# Patient Record
Sex: Male | Born: 1980
Health system: Southern US, Community
[De-identification: ages and names within clinical notes are randomized; demographics above are authoritative.]

## PROBLEM LIST (undated history)

## (undated) DIAGNOSIS — M722 Plantar fascial fibromatosis: Secondary | ICD-10-CM

## (undated) DIAGNOSIS — E785 Hyperlipidemia, unspecified: Secondary | ICD-10-CM

## (undated) DIAGNOSIS — E119 Type 2 diabetes mellitus without complications: Secondary | ICD-10-CM

## (undated) DIAGNOSIS — R0600 Dyspnea, unspecified: Secondary | ICD-10-CM

## (undated) DIAGNOSIS — I10 Essential (primary) hypertension: Secondary | ICD-10-CM

## (undated) DIAGNOSIS — J189 Pneumonia, unspecified organism: Secondary | ICD-10-CM

## (undated) DIAGNOSIS — C801 Malignant (primary) neoplasm, unspecified: Secondary | ICD-10-CM

## (undated) DIAGNOSIS — K219 Gastro-esophageal reflux disease without esophagitis: Secondary | ICD-10-CM

## (undated) HISTORY — PX: OTHER SURGICAL HISTORY: SHX169

---

## 2013-09-22 ENCOUNTER — Ambulatory Visit (INDEPENDENT_AMBULATORY_CARE_PROVIDER_SITE_OTHER): Payer: Managed Care, Other (non HMO) | Admitting: Nurse Practitioner

## 2013-09-22 ENCOUNTER — Encounter: Payer: Self-pay | Admitting: Nurse Practitioner

## 2013-09-22 ENCOUNTER — Encounter (INDEPENDENT_AMBULATORY_CARE_PROVIDER_SITE_OTHER): Payer: Self-pay

## 2013-09-22 VITALS — BP 149/93 | HR 80 | Temp 97.4°F | Ht 77.0 in | Wt 320.0 lb

## 2013-09-22 DIAGNOSIS — K120 Recurrent oral aphthae: Secondary | ICD-10-CM

## 2013-09-22 MED ORDER — MAGIC MOUTHWASH W/LIDOCAINE: 5.0000 mL | Freq: Four times a day (QID) | ORAL | Status: DC | PRN

## 2013-09-22 NOTE — Progress Notes (Signed)
   Subjective:    Patient ID: Thomas [name], male    DOB: 30-Sep-1981, 30 y.o.   MRN: [id number]  HPI Patient in c/o ulcer on his tongue- he felt it start coming on and now sore to touch- has tried some "home remedies" but nothing has helped. Patient denies use of tobacco products.    Review of Systems  Constitutional: Negative.   HENT: Negative.   Respiratory: Negative.   Cardiovascular: Negative.   Gastrointestinal: Negative.   All other systems reviewed and are negative.       Objective:   Physical Exam  Constitutional: He appears well-developed and well-nourished.  HENT:  Right Ear: External ear normal.  Left Ear: External ear normal.  Nose: Nose normal.  Mouth/Throat: Oropharynx is clear and moist.  1 cm white sore left side of tongue  Cardiovascular: Normal rate, regular rhythm and normal heart sounds.   Pulmonary/Chest: Effort normal and breath sounds normal.  Skin: Skin is warm and dry.     BP 149/93  Pulse 80  Temp(Src) 97.4 F (36.3 C) (Oral)  Ht 6\' 5"  (1.956 m)  Wt 320 lb (145.151 kg)  BMI 37.94 kg/m2      Assessment & Plan:   1. Canker sores oral    Meds ordered this encounter  Medications  . Alum & Mag Hydroxide-Simeth (MAGIC MOUTHWASH W/LIDOCAINE) SOLN    Sig: Take 5 mLs by mouth 4 (four) times daily as needed for mouth pain.    Dispense:  150 mL    Refill:  0    Order Specific Question:  Supervising Provider    Answer:  Deborra Medina   Gargling with salt water will help dry it up RTO prn  Mary-Margaret Daphine Deutscher, FNP

## 2013-09-22 NOTE — Patient Instructions (Signed)
Canker Sores  Canker sores are painful, open sores on the inside of the mouth and cheek. They may be white or yellow. The sores usually heal in 1 to 2 weeks. Women are more likely than men to have recurrent canker sores. CAUSES The cause of canker sores is not well understood. More than one cause is likely. Canker sores do not appear to be caused by certain types of germs (viruses or bacteria). Canker sores may be caused by:  An allergic reaction to certain foods.  Digestive problems.  Not having enough vitamin B12, folic acid, and iron.  Male sex hormones. Sores may come only during certain phases of a menstrual cycle. Often, there is improvement during pregnancy.  Genetics. Some people seem to inherit canker sore problems. Emotional stress and injuries to the mouth may trigger outbreaks, but not cause them.  DIAGNOSIS Canker sores are diagnosed by exam.  TREATMENT  Patients who have frequent bouts of canker sores may have cultures taken of the sores, blood tests, or allergy tests. This helps determine if their sores are caused by a poor diet, an allergy, or some other preventable or treatable disease.  Vitamins may prevent recurrences or reduce the severity of canker sores in people with poor nutrition.  Numbing ointments can relieve pain. These are available in drug stores without a prescription.  Anti-inflammatory steroid mouth rinses or gels may be prescribed by your caregiver for severe sores.  Oral steroids may be prescribed if you have severe, recurrent canker sores. These strong medicines can cause many side effects and should be used only under the close direction of a dentist or physician.  Mouth rinses containing the antibiotic medicine may be prescribed. They may lessen symptoms and speed healing. Healing usually happens in about 1 or 2 weeks with or without treatment. Certain antibiotic mouth rinses given to pregnant women and young children can permanently stain teeth.  Talk to your caregiver about your treatment. HOME CARE INSTRUCTIONS   Avoid foods that cause canker sores for you.  Avoid citrus juices, spicy or salty foods, and coffee until the sores are healed.  Use a soft-bristled toothbrush.  Chew your food carefully to avoid biting your cheek.  Apply topical numbing medicine to the sore to help relieve pain.  Apply a thin paste of baking soda and water to the sore to help heal the sore.  Only use mouth rinses or medicines for pain or discomfort as directed by your caregiver. SEEK MEDICAL CARE IF:   Your symptoms are not better in 1 week.  Your sores are still present after 2 weeks.  Your sores are very painful.  You have trouble breathing or swallowing.  Your sores come back frequently. Document Released: 01/12/2011 Document Revised: 01/12/2013 Document Reviewed: 01/12/2011 [employer] Patient Information 2014 [employer].  

## 2014-02-09 ENCOUNTER — Encounter: Payer: Self-pay | Admitting: Family Medicine

## 2014-02-09 ENCOUNTER — Ambulatory Visit (INDEPENDENT_AMBULATORY_CARE_PROVIDER_SITE_OTHER): Payer: Managed Care, Other (non HMO) | Admitting: Family Medicine

## 2014-02-09 VITALS — BP 121/76 | HR 81 | Temp 98.7°F | Ht 77.0 in | Wt 312.4 lb

## 2014-02-09 DIAGNOSIS — K219 Gastro-esophageal reflux disease without esophagitis: Secondary | ICD-10-CM

## 2014-02-09 MED ORDER — OMEPRAZOLE 20 MG PO CPDR: 20.0000 mg | DELAYED_RELEASE_CAPSULE | Freq: Every day | ORAL | Status: DC

## 2014-02-09 NOTE — Progress Notes (Signed)
   Subjective:    Patient ID: Thomas [name], male    DOB: 1980/11/10, 33 y.o.   MRN: [id number]  HPI This 33 y.o. male presents for evaluation of GERD sx's.  He has been having difficulty with Indigestion and GERD.   Review of Systems C/o GERD No chest pain, SOB, HA, dizziness, vision change, N/V, diarrhea, constipation, dysuria, urinary urgency or frequency, myalgias, arthralgias or rash.     Objective:   Physical Exam  Vital signs noted  Well developed well nourished male.  HEENT - Head atraumatic Normocephalic                Eyes - PERRLA, Conjuctiva - clear Sclera- Clear EOMI                Ears - EAC's Wnl TM's Wnl Gross Hearing WNL                Nose - Nares patent                 Throat - oropharanx wnl Respiratory - Lungs CTA bilateral Cardiac - RRR S1 and S2 without murmur GI - Abdomen soft Nontender and bowel sounds active x 4 Extremities - No edema. Neuro - Grossly intact.      Assessment & Plan:  GERD (gastroesophageal reflux disease) - Plan: omeprazole (PRILOSEC) 20 MG capsule Po bid and discussed eating bland diet  Deatra CanterWilliam J Oxford FNP

## 2014-02-26 ENCOUNTER — Encounter: Payer: Self-pay | Admitting: Family Medicine

## 2014-02-26 ENCOUNTER — Ambulatory Visit (INDEPENDENT_AMBULATORY_CARE_PROVIDER_SITE_OTHER): Payer: Managed Care, Other (non HMO) | Admitting: Family Medicine

## 2014-02-26 VITALS — BP 128/81 | HR 74 | Temp 97.3°F | Ht 77.0 in | Wt 309.0 lb

## 2014-02-26 DIAGNOSIS — K219 Gastro-esophageal reflux disease without esophagitis: Secondary | ICD-10-CM

## 2014-02-26 MED ORDER — OMEPRAZOLE 40 MG PO CPDR: DELAYED_RELEASE_CAPSULE | ORAL | Status: DC

## 2014-02-26 NOTE — Progress Notes (Signed)
   Subjective:    Patient ID: Thomas [name], male    DOB: Jun 13, 1981, 33 y.o.   MRN: [id number]  HPI  This 33 y.o. male presents for evaluation of left upper quadrant discomfort.  He states the omeprazole 20mg  po bid is not helping. He has been having discomfort and has eaten and it went away.  He states he bends over and feels the discomfort in his abdomen.  He gets sharp pain that is colicky in nature in is left upper quadrant of abdomen and sometimes is above his naval and radiates to the LUQ of his abdomen.  He has hx of GERD.  He denies any GERD sx's at night time and states he is sleeping well.  Review of Systems C/o abdominal pain   No chest pain, SOB, HA, dizziness, vision change, N/V, diarrhea, constipation, dysuria, urinary urgency or frequency, myalgias, arthralgias or rash.  Objective:   Physical Exam  Vital signs noted  Well developed well nourished male.  HEENT - Head atraumatic Normocephalic                Eyes - PERRLA, Conjuctiva - clear Sclera- Clear EOMI                Ears - EAC's Wnl TM's Wnl Gross Hearing WNL                Nose - Nares patent                 Throat - oropharanx wnl Respiratory - Lungs CTA bilateral Cardiac - RRR S1 and S2 without murmur GI - Abdomen soft Nontender and bowel sounds active x 4 Extremities - No edema. Neuro - Grossly intact.      Assessment & Plan:  GERD (gastroesophageal reflux disease) - Plan: omeprazole (PRILOSEC) 40 MG capsule, Ambulatory referral to Gastroenterology due to no change in sx's after tx'd for 2 weeks bid PPI tx.  Abdominal pain - Refer to GI, increase GERD med prilosec to 40mg  po bid  Deatra Canter FNP

## 2014-03-09 ENCOUNTER — Other Ambulatory Visit: Payer: Self-pay | Admitting: *Deleted

## 2014-03-09 DIAGNOSIS — K219 Gastro-esophageal reflux disease without esophagitis: Secondary | ICD-10-CM

## 2014-03-09 MED ORDER — OMEPRAZOLE 40 MG PO CPDR: DELAYED_RELEASE_CAPSULE | ORAL | Status: DC

## 2014-03-23 ENCOUNTER — Other Ambulatory Visit: Payer: Self-pay | Admitting: *Deleted

## 2014-03-23 DIAGNOSIS — K219 Gastro-esophageal reflux disease without esophagitis: Secondary | ICD-10-CM

## 2014-03-23 MED ORDER — OMEPRAZOLE 40 MG PO CPDR: DELAYED_RELEASE_CAPSULE | ORAL | Status: DC

## 2014-03-25 ENCOUNTER — Encounter: Payer: Self-pay | Admitting: Gastroenterology

## 2014-06-01 ENCOUNTER — Ambulatory Visit: Payer: Self-pay | Admitting: Gastroenterology

## 2014-06-25 ENCOUNTER — Ambulatory Visit: Payer: Self-pay | Admitting: Gastroenterology

## 2015-03-02 DIAGNOSIS — Z87442 Personal history of urinary calculi: Secondary | ICD-10-CM

## 2015-03-02 HISTORY — DX: Personal history of urinary calculi: Z87.442

## 2015-03-23 ENCOUNTER — Ambulatory Visit (INDEPENDENT_AMBULATORY_CARE_PROVIDER_SITE_OTHER): Payer: Managed Care, Other (non HMO) | Admitting: Family Medicine

## 2015-03-23 ENCOUNTER — Encounter: Payer: Self-pay | Admitting: Family Medicine

## 2015-03-23 ENCOUNTER — Encounter (INDEPENDENT_AMBULATORY_CARE_PROVIDER_SITE_OTHER): Payer: Self-pay

## 2015-03-23 VITALS — BP 135/93 | HR 86 | Temp 98.3°F | Ht 77.0 in | Wt 313.0 lb

## 2015-03-23 DIAGNOSIS — R1032 Left lower quadrant pain: Secondary | ICD-10-CM

## 2015-03-23 DIAGNOSIS — Z0000 Encounter for general adult medical examination without abnormal findings: Secondary | ICD-10-CM

## 2015-03-23 LAB — POCT URINALYSIS DIPSTICK
Bilirubin, UA: NEGATIVE
Glucose, UA: NEGATIVE
KETONES UA: NEGATIVE
Nitrite, UA: NEGATIVE
PROTEIN UA: NEGATIVE
SPEC GRAV UA: 1.025
Urobilinogen, UA: NEGATIVE
pH, UA: 5

## 2015-03-23 LAB — POCT UA - MICROSCOPIC ONLY
CASTS, UR, LPF, POC: NEGATIVE
Crystals, Ur, HPF, POC: NEGATIVE
MUCUS UA: NEGATIVE
Yeast, UA: NEGATIVE

## 2015-03-23 MED ORDER — CIPROFLOXACIN HCL 500 MG PO TABS: 500.0000 mg | ORAL_TABLET | Freq: Two times a day (BID) | ORAL | Status: DC

## 2015-03-23 NOTE — Progress Notes (Signed)
   Subjective:    Patient ID: Thomas [name], male    DOB: 04-27-1981, 34 y.o.   MRN: [id number]  HPI  34 year old gentleman here for physical exam. He has had some vague lower abdominal discomfort with some dysuria intermittently. He was seen at urgent care who mentioned kidney stone without doing urinalysis. Has been no change in bowel habits. There is no history of STD. There is no real urinary frequency or hematuria.  Also complains of some pain in his left lower leg posteriorly. His leg seems to get tired after he is set for a long period of time. There is been no injury or trauma.    Review of Systems  Constitutional: Negative.   HENT: Negative.   Respiratory: Negative.   Cardiovascular: Negative.   Gastrointestinal: Positive for abdominal pain.  Genitourinary: Negative.   Neurological: Negative.   Psychiatric/Behavioral: Negative.    There are no active problems to display for this patient.  Outpatient Encounter Prescriptions as of 03/23/2015  Medication Sig  . tamsulosin (FLOMAX) 0.4 MG CAPS capsule TAKE ONE CAPSULE (0.4 MG TOTAL) BY MOUTH DAILY.  . [DISCONTINUED] omeprazole (PRILOSEC) 40 MG capsule Take one po bid   No facility-administered encounter medications on file as of 03/23/2015.       Objective:   Physical Exam  Constitutional: He is oriented to person, place, and time. He appears well-developed and well-nourished.  HENT:  Head: Normocephalic.  Right Ear: External ear normal.  Left Ear: External ear normal.  Nose: Nose normal.  Mouth/Throat: Oropharynx is clear and moist.  Eyes: Conjunctivae and EOM are normal. Pupils are equal, round, and reactive to light.  Neck: Normal range of motion. Neck supple.  Cardiovascular: Normal rate, regular rhythm, normal heart sounds and intact distal pulses.   Pulmonary/Chest: Effort normal and breath sounds normal.  Abdominal: Soft. Bowel sounds are normal.  Genitourinary:  No hernia and no testicular tenderness or lumps    Musculoskeletal: Normal range of motion.  Neurological: He is alert and oriented to person, place, and time.  Skin: Skin is warm and dry.  Psychiatric: He has a normal mood and affect. His behavior is normal. Judgment and thought content normal.          Assessment & Plan:  1. Suprapubic abdominal pain, left Urinalysis is not normal. There are white cells as well as red cells, etiology of which is undetermined. This could represent stone disease or possibly prostatitis. Would like to treat him for one week with Cipro and then recheck - POCT urinalysis dipstick - POCT UA - Microscopic Only  2. Health care maintenance He has never had lipids or sugar checked. - CMP14+EGFR; Future  Wardell Honour MD - Lipid panel; Future

## 2015-03-23 NOTE — Patient Instructions (Signed)

## 2015-03-26 ENCOUNTER — Other Ambulatory Visit: Payer: Managed Care, Other (non HMO)

## 2015-03-26 DIAGNOSIS — Z0000 Encounter for general adult medical examination without abnormal findings: Secondary | ICD-10-CM

## 2015-03-27 LAB — CMP14+EGFR
ALK PHOS: 69 IU/L (ref 39–117)
ALT: 20 IU/L (ref 0–44)
AST: 17 IU/L (ref 0–40)
Albumin/Globulin Ratio: 1.8 (ref 1.1–2.5)
Albumin: 4.4 g/dL (ref 3.5–5.5)
BUN / CREAT RATIO: 19 (ref 8–19)
BUN: 22 mg/dL — ABNORMAL HIGH (ref 6–20)
Bilirubin Total: 0.9 mg/dL (ref 0.0–1.2)
CALCIUM: 9.7 mg/dL (ref 8.7–10.2)
CO2: 24 mmol/L (ref 18–29)
Chloride: 101 mmol/L (ref 97–108)
Creatinine, Ser: 1.13 mg/dL (ref 0.76–1.27)
GFR calc Af Amer: 99 mL/min/{1.73_m2} (ref >59)
GFR calc non Af Amer: 86 mL/min/{1.73_m2} (ref >59)
GLUCOSE: 94 mg/dL (ref 65–99)
Globulin, Total: 2.5 g/dL (ref 1.5–4.5)
Potassium: 4.6 mmol/L (ref 3.5–5.2)
Sodium: 140 mmol/L (ref 134–144)
Total Protein: 6.9 g/dL (ref 6.0–8.5)

## 2015-03-27 LAB — LIPID PANEL
CHOL/HDL RATIO: 5.3 ratio — AB (ref 0.0–5.0)
Cholesterol, Total: 176 mg/dL (ref 100–199)
HDL: 33 mg/dL — ABNORMAL LOW (ref >39)
LDL Calculated: 116 mg/dL — ABNORMAL HIGH (ref 0–99)
TRIGLYCERIDES: 134 mg/dL (ref 0–149)
VLDL Cholesterol Cal: 27 mg/dL (ref 5–40)

## 2015-03-29 ENCOUNTER — Telehealth: Payer: Self-pay | Admitting: Family Medicine

## 2015-03-29 NOTE — Progress Notes (Signed)
lmtcb

## 2015-03-30 ENCOUNTER — Encounter: Payer: Self-pay | Admitting: *Deleted

## 2015-03-30 NOTE — Telephone Encounter (Signed)
Letter with results mailed.

## 2015-04-07 ENCOUNTER — Ambulatory Visit (INDEPENDENT_AMBULATORY_CARE_PROVIDER_SITE_OTHER): Payer: Managed Care, Other (non HMO) | Admitting: Family Medicine

## 2015-04-07 ENCOUNTER — Encounter: Payer: Self-pay | Admitting: Family Medicine

## 2015-04-07 ENCOUNTER — Ambulatory Visit (INDEPENDENT_AMBULATORY_CARE_PROVIDER_SITE_OTHER): Payer: Managed Care, Other (non HMO)

## 2015-04-07 VITALS — BP 127/79 | HR 76 | Temp 97.9°F | Ht 77.0 in | Wt 314.0 lb

## 2015-04-07 DIAGNOSIS — R109 Unspecified abdominal pain: Secondary | ICD-10-CM | POA: Diagnosis not present

## 2015-04-07 LAB — POCT URINALYSIS DIPSTICK
BILIRUBIN UA: NEGATIVE
GLUCOSE UA: NEGATIVE
Ketones, UA: NEGATIVE
Leukocytes, UA: NEGATIVE
NITRITE UA: NEGATIVE
Urobilinogen, UA: NEGATIVE
pH, UA: 6

## 2015-04-07 LAB — POCT UA - MICROSCOPIC ONLY
CASTS, UR, LPF, POC: NEGATIVE
Crystals, Ur, HPF, POC: NEGATIVE
Epithelial cells, urine per micros: NEGATIVE
Mucus, UA: NEGATIVE
RBC, URINE, MICROSCOPIC: NEGATIVE
WBC, Ur, HPF, POC: NEGATIVE
Yeast, UA: NEGATIVE

## 2015-04-07 NOTE — Progress Notes (Signed)
   Subjective:    Patient ID: Thomas PippinsKurt D Yeo, male    DOB: Aug 23, 1981, 34 y.o.   MRN: [id number]  HPI Patient here today for follow up on abdominal discomfort. He has some flank and some left knee pain.  Flank pain has resolved with a course of Cipro. In discussing this with him he has had some infection in the past was treated with the same metabolic.  He is also complaining of knee pain today. Over the weekend he was crouching on his roof doing pressure washing. There was no fall or injury that he remembers but afterwards he started feeling pain in his left knee on the medial aspect. He has a faint recollection of injury as a teenager playing basketball but that was never really attended to.     There are no active problems to display for this patient.  Outpatient Encounter Prescriptions as of 04/07/2015  Medication Sig  . [DISCONTINUED] ciprofloxacin (CIPRO) 500 MG tablet Take 1 tablet (500 mg total) by mouth 2 (two) times daily.  . [DISCONTINUED] tamsulosin (FLOMAX) 0.4 MG CAPS capsule TAKE ONE CAPSULE (0.4 MG TOTAL) BY MOUTH DAILY.   No facility-administered encounter medications on file as of 04/07/2015.     Review of Systems  Constitutional: Negative.   HENT: Negative.   Eyes: Negative.   Respiratory: Negative.   Cardiovascular: Negative.   Gastrointestinal: Negative.  Abdominal pain: better.  Endocrine: Negative.   Genitourinary: Negative.   Musculoskeletal: Positive for arthralgias (left knee pain).  Skin: Negative.   Allergic/Immunologic: Negative.   Neurological: Negative.   Hematological: Negative.   Psychiatric/Behavioral: Negative.        Objective:   Physical Exam  Constitutional: He appears well-developed and well-nourished.  Musculoskeletal:  Left knee: There is no effusion. There is some tenderness medially the knee is stable to varus and valgus stress. Anterior drawer sign is negative. There is no tenderness on movement of the patella. X-ray shows a small  bones given his rather large size but the joint space is well maintained, tibial spines are normal and x-ray is otherwise normal   BP 127/79 mmHg  Pulse 76  Temp(Src) 97.9 F (36.6 C) (Oral)  Ht 6\' 5"  (1.956 m)  Wt 314 lb (142.429 kg)  BMI 37.23 kg/m2        Assessment & Plan:  1. Flank pain Pain has pretty much resolved do a culture to rule out any residual infection - POCT UA - Microscopic Only - POCT urinalysis dipstick - Urine culture - DG Knee 1-2 Views Left; Future  2 Knee pain Suspect strain of the medial collateral ligament Would recommend neoprene sleeve with ibuprofen and ice to be applied as needed  Frederica KusterStephen M Jashiya Bassett MD

## 2015-04-09 LAB — URINE CULTURE: ORGANISM ID, BACTERIA: NO GROWTH

## 2015-05-07 ENCOUNTER — Ambulatory Visit (INDEPENDENT_AMBULATORY_CARE_PROVIDER_SITE_OTHER): Payer: Managed Care, Other (non HMO) | Admitting: Nurse Practitioner

## 2015-05-07 VITALS — BP 130/83 | HR 91 | Temp 97.5°F | Ht 77.0 in | Wt 320.0 lb

## 2015-05-07 DIAGNOSIS — J069 Acute upper respiratory infection, unspecified: Secondary | ICD-10-CM | POA: Diagnosis not present

## 2015-05-07 MED ORDER — AZITHROMYCIN 250 MG PO TABS: ORAL_TABLET | ORAL | Status: DC

## 2015-05-07 NOTE — Patient Instructions (Signed)

## 2015-05-07 NOTE — Progress Notes (Signed)
  Subjective:     Thomas [name] is a 34 y.o. male here for evaluation of a cough. Onset of symptoms was 1 week ago. Symptoms have been gradually worsening since that time. The cough is dry, harsh and nonproductive and is aggravated by nothing. Associated symptoms include: change in voice, fever and achiness. Patient does not have a history of asthma. Patient does have a history of environmental allergens. Patient has not traveled recently. Patient does not have a history of smoking. Patient has not had a previous chest x-ray. Patient has not had a PPD done.  The following portions of the patient's history were reviewed and updated as appropriate: allergies, current medications, past family history, past medical history, past social history, past surgical history and problem list.  Review of Systems Pertinent items are noted in HPI.    Objective:     BP 130/83 mmHg  Pulse 91  Temp(Src) 97.5 F (36.4 C) (Oral)  Ht  (1.956 m)  Wt 320 lb (145.151 kg)  BMI 37.94 kg/m2 General appearance: alert and cooperative Eyes: conjunctivae/corneas clear. PERRL, EOM's intact. Fundi benign. Ears: normal TM's and external ear canals both ears Nose: clear discharge, moderate congestion, no sinus tenderness Throat: lips, mucosa, and tongue normal; teeth and gums normal Neck: no adenopathy, no carotid bruit, no JVD, supple, symmetrical, trachea midline and thyroid not enlarged, symmetric, no tenderness/mass/nodules Lungs: clear to auscultation bilaterally and deep dry cough Heart: regular rate and rhythm, S1, S2 normal, no murmur, click, rub or gallop    Assessment:    URI with Post Nasal Drip    Plan:   1. Take meds as prescribed 2. Use a cool mist humidifier especially during the winter months and when heat has been humid. 3. Use saline nose sprays frequently 4. Saline irrigations of the nose can be very helpful if done frequently.  * 4X daily for 1 week*  * Use of a nettie pot can be helpful  with this. Follow directions with this* 5. Drink plenty of fluids 6. Keep thermostat turn down low 7.For any cough or congestion  Use plain Mucinex- regular strength or max strength is fine   * Children- consult with Pharmacist for dosing 8. For fever or aces or pains- take tylenol or ibuprofen appropriate for age and weight.  * for fevers greater than 101 orally you may alternate ibuprofen and tylenol every  3 hours.       Meds ordered this encounter  Medications  . azithromycin (ZITHROMAX Z-PAK) 250 MG tablet    Sig: As directed    Dispense:  1 each    Refill:  0    Order Specific Question:  Supervising Provider    Answer:  Ernestina Penna [[id number]]   Mary-Margaret Daphine Deutscher, FNP

## 2015-09-06 ENCOUNTER — Telehealth: Payer: Self-pay | Admitting: Family Medicine

## 2015-09-06 NOTE — Telephone Encounter (Signed)
denied °

## 2015-09-27 ENCOUNTER — Ambulatory Visit (INDEPENDENT_AMBULATORY_CARE_PROVIDER_SITE_OTHER): Payer: Managed Care, Other (non HMO) | Admitting: Family Medicine

## 2015-09-27 ENCOUNTER — Encounter: Payer: Self-pay | Admitting: Family Medicine

## 2015-09-27 VITALS — BP 127/87 | HR 94 | Temp 97.1°F | Ht 77.0 in | Wt 340.2 lb

## 2015-09-27 DIAGNOSIS — L739 Follicular disorder, unspecified: Secondary | ICD-10-CM | POA: Diagnosis not present

## 2015-09-27 MED ORDER — SULFAMETHOXAZOLE-TRIMETHOPRIM 800-160 MG PO TABS: ORAL_TABLET | ORAL | Status: DC

## 2015-09-27 NOTE — Progress Notes (Signed)
   Subjective:    Patient ID: Ronald PippinsKurt D Bibbins, male    DOB: 1981-03-05, 34 y.o.   MRN: [id number]  HPI [age] year old with skin issues. He was seen by dermatologist and given doxycycline for what was thought to be a staph folliculitis. For the past month and a half he has had bumps on his skin from the neck down. They do not turn into abscesses or boils but are red and papular and scab over there probably about 20 altogether now. The doxycycline had no effect.  There are no active problems to display for this patient.  Outpatient Encounter Prescriptions as of 09/27/2015  Medication Sig  . [DISCONTINUED] azithromycin (ZITHROMAX Z-PAK) 250 MG tablet As directed   No facility-administered encounter medications on file as of 09/27/2015.     Review of Systems  Skin: Positive for rash.       Objective:   Physical Exam  Skin:  Numerous lesions on the extremities and trunk in various stages of development and poor healing. They're macular papular which scab over. There is no drainage. Nothing really to culture but I would still say these are related to a staph infection. This started after a vacation trip to the [place].          Assessment & Plan:  1. Folliculitis Since doxycycline was not effective will try Septra. He is to take 1 tablet twice a day for 1 week and then once a day thereafter until he sees dermatologist again. - Ambulatory referral to Dermatology  Frederica KusterStephen M Lasharn Bufkin MD

## 2015-11-24 ENCOUNTER — Encounter: Payer: Self-pay | Admitting: Family Medicine

## 2015-11-24 ENCOUNTER — Ambulatory Visit (INDEPENDENT_AMBULATORY_CARE_PROVIDER_SITE_OTHER): Payer: Managed Care, Other (non HMO) | Admitting: Family Medicine

## 2015-11-24 VITALS — BP 140/90 | HR 114 | Temp 101.1°F | Ht 77.0 in | Wt 340.4 lb

## 2015-11-24 DIAGNOSIS — J111 Influenza due to unidentified influenza virus with other respiratory manifestations: Secondary | ICD-10-CM

## 2015-11-24 DIAGNOSIS — R6883 Chills (without fever): Secondary | ICD-10-CM | POA: Diagnosis not present

## 2015-11-24 DIAGNOSIS — R52 Pain, unspecified: Secondary | ICD-10-CM | POA: Diagnosis not present

## 2015-11-24 DIAGNOSIS — R509 Fever, unspecified: Secondary | ICD-10-CM | POA: Diagnosis not present

## 2015-11-24 LAB — POCT INFLUENZA A/B
Influenza A, POC: POSITIVE — AB
Influenza B, POC: NEGATIVE

## 2015-11-24 MED ORDER — OSELTAMIVIR PHOSPHATE 75 MG PO CAPS: 75.0000 mg | ORAL_CAPSULE | Freq: Two times a day (BID) | ORAL | Status: DC

## 2015-11-24 NOTE — Progress Notes (Signed)
BP 140/90 mmHg  Pulse 114  Temp(Src) 101.1 F (38.4 C) (Oral)  Ht  (1.956 m)  Wt 340 lb 6.4 oz (154.404 kg)  BMI 40.36 kg/m2   Subjective:    Patient ID: Thomas [name], male    DOB: 31-Oct-1980, 35 y.o.   MRN: [id number]  HPI: Thomas [name] is a 35 y.o. male presenting on 11/24/2015 for Generalized Body Aches; Chills; Fever; and Cough & chest congestion   HPI Fevers chills and body aches Patient has been having fevers chills and body aches and chest congestion and cough for the past 2 days. He does not know of anybody specifically that he would've gotten this from. He has been using some over-the-counter DayQuil and NyQuil. He is also been using Tylenol and ibuprofen for it. His fever today is 101.1 and has been as high as 102 yesterday. He denies any shortness of breath or wheezing. His cough is productive of yellow sputum  Relevant past medical, surgical, family and social history reviewed and updated as indicated. Interim medical history since our last visit reviewed. Allergies and medications reviewed and updated.  Review of Systems  Constitutional: Positive for fever and chills.  HENT: Positive for congestion, postnasal drip, rhinorrhea, sinus pressure, sneezing and sore throat. Negative for ear discharge, ear pain and voice change.   Eyes: Negative for pain, discharge, redness and visual disturbance.  Respiratory: Positive for cough. Negative for chest tightness, shortness of breath and wheezing.   Cardiovascular: Negative for chest pain and leg swelling.  Gastrointestinal: Negative for abdominal pain, diarrhea and constipation.  Genitourinary: Negative for difficulty urinating.  Musculoskeletal: Negative for back pain and gait problem.  Skin: Negative for rash.  Neurological: Negative for syncope, light-headedness and headaches.  All other systems reviewed and are negative.   Per HPI unless specifically indicated above     Medication List       This list is  accurate as of: 11/24/15  9:37 AM.  Always use your most recent med list.               oseltamivir 75 MG capsule  Commonly known as:  TAMIFLU  Take 1 capsule (75 mg total) by mouth 2 (two) times daily.           Objective:    BP 140/90 mmHg  Pulse 114  Temp(Src) 101.1 F (38.4 C) (Oral)  Ht  (1.956 m)  Wt 340 lb 6.4 oz (154.404 kg)  BMI 40.36 kg/m2  Wt Readings from Last 3 Encounters:  11/24/15 340 lb 6.4 oz (154.404 kg)  09/27/15 340 lb 3.2 oz (154.314 kg)  05/07/15 320 lb (145.151 kg)    Physical Exam  Constitutional: He is oriented to person, place, and time. He appears well-developed and well-nourished. No distress.  HENT:  Right Ear: Tympanic membrane, external ear and ear canal normal.  Left Ear: Tympanic membrane, external ear and ear canal normal.  Nose: Mucosal edema and rhinorrhea present. No sinus tenderness. No epistaxis. Right sinus exhibits no maxillary sinus tenderness and no frontal sinus tenderness. Left sinus exhibits no maxillary sinus tenderness and no frontal sinus tenderness.  Mouth/Throat: Uvula is midline and mucous membranes are normal. Posterior oropharyngeal edema and posterior oropharyngeal erythema present. No oropharyngeal exudate or tonsillar abscesses.  Eyes: Conjunctivae and EOM are normal. Pupils are equal, round, and reactive to light. Right eye exhibits no discharge. No scleral icterus.  Neck: Neck supple. No thyromegaly present.  Cardiovascular: Normal rate, regular rhythm,  normal heart sounds and intact distal pulses.   No murmur heard. Pulmonary/Chest: Effort normal and breath sounds normal. No respiratory distress. He has no wheezes.  Musculoskeletal: Normal range of motion. He exhibits no edema.  Lymphadenopathy:    He has no cervical adenopathy.  Neurological: He is alert and oriented to person, place, and time. Coordination normal.  Skin: Skin is warm and dry. No rash noted. He is not diaphoretic.  Psychiatric: He has a  normal mood and affect. His behavior is normal.  Nursing note and vitals reviewed.   Results for orders placed or performed in visit on 11/24/15  POCT Influenza A/B  Result Value Ref Range   Influenza A, POC Positive (A) Negative   Influenza B, POC Negative Negative      Assessment & Plan:   Problem List Items Addressed This Visit    None    Visit Diagnoses    Body aches    -  Primary    Relevant Orders    POCT Influenza A/B (Completed)    Chills        Relevant Orders    POCT Influenza A/B (Completed)    Fever, unspecified        Relevant Medications    oseltamivir (TAMIFLU) 75 MG capsule    Other Relevant Orders    POCT Influenza A/B (Completed)    Influenza with respiratory manifestation        Relevant Medications    oseltamivir (TAMIFLU) 75 MG capsule        Follow up plan: Return if symptoms worsen or fail to improve.  Counseling provided for all of the vaccine components Orders Placed This Encounter  Procedures  . POCT Influenza A/B    Arville Care, MD [facility] 11/24/2015, 9:37 AM

## 2015-11-28 DIAGNOSIS — Z0289 Encounter for other administrative examinations: Secondary | ICD-10-CM

## 2015-12-28 ENCOUNTER — Ambulatory Visit (INDEPENDENT_AMBULATORY_CARE_PROVIDER_SITE_OTHER): Payer: Managed Care, Other (non HMO) | Admitting: Family Medicine

## 2015-12-28 ENCOUNTER — Encounter: Payer: Self-pay | Admitting: Family Medicine

## 2015-12-28 VITALS — BP 125/82 | HR 101 | Temp 100.1°F | Ht 77.0 in | Wt 344.4 lb

## 2015-12-28 DIAGNOSIS — L410 Pityriasis lichenoides et varioliformis acuta: Secondary | ICD-10-CM

## 2015-12-28 DIAGNOSIS — J020 Streptococcal pharyngitis: Secondary | ICD-10-CM | POA: Diagnosis not present

## 2015-12-28 DIAGNOSIS — J029 Acute pharyngitis, unspecified: Secondary | ICD-10-CM | POA: Diagnosis not present

## 2015-12-28 LAB — RAPID STREP SCREEN (MED CTR MEBANE ONLY): STREP GP A AG, IA W/REFLEX: POSITIVE — AB

## 2015-12-28 MED ORDER — AZITHROMYCIN 250 MG PO TABS: ORAL_TABLET | ORAL | Status: DC

## 2015-12-29 NOTE — Progress Notes (Signed)
BP 125/82 mmHg  Pulse 101  Temp(Src) 100.1 F (37.8 C) (Oral)  Ht 6\' 5"  (1.956 m)  Wt 344 lb 6.4 oz (156.219 kg)  BMI 40.83 kg/m2   Subjective:    Patient ID: Thomas PippinsKurt D Fils, male    DOB: [birth year], 35 y.o.   MRN: [id number]  HPI: Thomas [name] is a 35 y.o. male presenting on 12/28/2015 for Sore Throat   HPI Sore throat Patient is coming in with 2 days of sore throat and fevers and aches and chills. He did just have the flu a couple weeks ago but that resolved and this started just a couple days ago. His fever has been low-grade and his sore throat is been significant but not as bad as he had before when he has had strep previously. He denies any shortness of breath or wheezing.  Rash Patient has had a rash that is been diagnosed previously by a dermatologist as PLEVA and he has tried antibiotic courses for this. He still has the rash which are small papules that turn into scabs and then scar over spread over his body sparing palms and soles and genitalia and mucosal surfaces. He denies any fevers or chills or drainage out of the sites. He tried doxycycline the first-line treatment for it but has not tried anything since then. They did a biopsy of it and diagnoses this at the dermatologist per the patient.  Relevant past medical, surgical, family and social history reviewed and updated as indicated. Interim medical history since our last visit reviewed. Allergies and medications reviewed and updated.  Review of Systems  Constitutional: Positive for fever. Negative for chills.  HENT: Positive for congestion, postnasal drip, rhinorrhea, sinus pressure, sneezing and sore throat. Negative for ear discharge, ear pain and voice change.   Eyes: Negative for pain, discharge, redness and visual disturbance.  Respiratory: Negative for cough, shortness of breath and wheezing.   Cardiovascular: Negative for chest pain and leg swelling.  Gastrointestinal: Negative for abdominal pain, diarrhea  and constipation.  Genitourinary: Negative for difficulty urinating.  Musculoskeletal: Positive for myalgias. Negative for back pain and gait problem.  Skin: Positive for rash.  Neurological: Negative for syncope, light-headedness and headaches.  All other systems reviewed and are negative.   Per HPI unless specifically indicated above     Medication List       This list is accurate as of: 12/28/15 11:59 PM.  Always use your most recent med list.               azithromycin 250 MG tablet  Commonly known as:  ZITHROMAX  Take 2 the first day and then one each day after. Repeat after 2nd week           Objective:    BP 125/82 mmHg  Pulse 101  Temp(Src) 100.1 F (37.8 C) (Oral)  Ht 6\' 5"  (1.956 m)  Wt 344 lb 6.4 oz (156.219 kg)  BMI 40.83 kg/m2  Wt Readings from Last 3 Encounters:  12/28/15 344 lb 6.4 oz (156.219 kg)  11/24/15 340 lb 6.4 oz (154.404 kg)  09/27/15 340 lb 3.2 oz (154.314 kg)    Physical Exam  Constitutional: He is oriented to person, place, and time. He appears well-developed and well-nourished. No distress.  HENT:  Right Ear: Tympanic membrane, external ear and ear canal normal.  Left Ear: Tympanic membrane, external ear and ear canal normal.  Nose: Mucosal edema and rhinorrhea present. No sinus tenderness. No epistaxis. Right sinus  exhibits maxillary sinus tenderness. Right sinus exhibits no frontal sinus tenderness. Left sinus exhibits maxillary sinus tenderness. Left sinus exhibits no frontal sinus tenderness.  Mouth/Throat: Uvula is midline and mucous membranes are normal. Posterior oropharyngeal edema and posterior oropharyngeal erythema present. No oropharyngeal exudate or tonsillar abscesses.  Eyes: Conjunctivae and EOM are normal. Pupils are equal, round, and reactive to light. Right eye exhibits no discharge. No scleral icterus.  Neck: Neck supple. No thyromegaly present.  Cardiovascular: Normal rate, regular rhythm, normal heart sounds and  intact distal pulses.   No murmur heard. Pulmonary/Chest: Effort normal and breath sounds normal. No respiratory distress. He has no wheezes. He has no rales.  Musculoskeletal: Normal range of motion. He exhibits no edema.  Lymphadenopathy:    He has no cervical adenopathy.  Neurological: He is alert and oriented to person, place, and time. Coordination normal.  Skin: Skin is warm and dry. Rash (Few scattered papules on arms and legs and torso sparing face palms and soles genitalia and mucosal surfaces.) noted. He is not diaphoretic.  Psychiatric: He has a normal mood and affect. His behavior is normal.  Nursing note and vitals reviewed.   Results for orders placed or performed in visit on 12/28/15  Rapid strep screen (not at [facility])  Result Value Ref Range   Strep Gp A Ag, IA W/Reflex Positive (A) Negative      Assessment & Plan:   Problem List Items Addressed This Visit      Musculoskeletal and Integument   PLEVA (pityriasis lichenoides et varioliformis acuta)   Relevant Medications   azithromycin (ZITHROMAX) 250 MG tablet    Other Visit Diagnoses    Sore throat    -  Primary    Relevant Medications    azithromycin (ZITHROMAX) 250 MG tablet    Other Relevant Orders    Rapid strep screen (not at [facility]) (Completed)    Strep pharyngitis        Relevant Medications    azithromycin (ZITHROMAX) 250 MG tablet       Follow up plan: Return if symptoms worsen or fail to improve.  Counseling provided for all of the vaccine components Orders Placed This Encounter  Procedures  . Rapid strep screen (not at [facility])    Arville Care, MD [facility] 12/28/2015, 10:38 AM

## 2016-02-24 ENCOUNTER — Emergency Department (HOSPITAL_COMMUNITY): Payer: Managed Care, Other (non HMO)

## 2016-02-24 ENCOUNTER — Emergency Department (HOSPITAL_COMMUNITY)
Admission: EM | Admit: 2016-02-24 | Discharge: 2016-02-24 | Disposition: A | Discharge status: Home / Self Care | Payer: Managed Care, Other (non HMO) | Attending: Emergency Medicine | Admitting: Emergency Medicine

## 2016-02-24 ENCOUNTER — Encounter (HOSPITAL_COMMUNITY): Payer: Self-pay | Admitting: Emergency Medicine

## 2016-02-24 DIAGNOSIS — N1330 Unspecified hydronephrosis: Secondary | ICD-10-CM | POA: Insufficient documentation

## 2016-02-24 DIAGNOSIS — R1032 Left lower quadrant pain: Secondary | ICD-10-CM | POA: Diagnosis present

## 2016-02-24 DIAGNOSIS — Z87891 Personal history of nicotine dependence: Secondary | ICD-10-CM | POA: Insufficient documentation

## 2016-02-24 DIAGNOSIS — N201 Calculus of ureter: Secondary | ICD-10-CM | POA: Insufficient documentation

## 2016-02-24 LAB — COMPREHENSIVE METABOLIC PANEL
ALBUMIN: 4.1 g/dL (ref 3.5–5.0)
ALK PHOS: 67 U/L (ref 38–126)
ALT: 26 U/L (ref 17–63)
AST: 23 U/L (ref 15–41)
Anion gap: 9 (ref 5–15)
BUN: 19 mg/dL (ref 6–20)
CALCIUM: 9.6 mg/dL (ref 8.9–10.3)
CHLORIDE: 106 mmol/L (ref 101–111)
CO2: 24 mmol/L (ref 22–32)
Creatinine, Ser: 1.2 mg/dL (ref 0.61–1.24)
GFR calc non Af Amer: >60 mL/min (ref >60)
Glucose, Bld: 126 mg/dL — ABNORMAL HIGH (ref 65–99)
POTASSIUM: 4.1 mmol/L (ref 3.5–5.1)
SODIUM: 139 mmol/L (ref 135–145)
Total Bilirubin: 0.8 mg/dL (ref 0.3–1.2)
Total Protein: 7.3 g/dL (ref 6.5–8.1)

## 2016-02-24 LAB — URINALYSIS, ROUTINE W REFLEX MICROSCOPIC
BILIRUBIN URINE: NEGATIVE
Glucose, UA: NEGATIVE mg/dL
KETONES UR: NEGATIVE mg/dL
Leukocytes, UA: NEGATIVE
NITRITE: NEGATIVE
Protein, ur: NEGATIVE mg/dL
Specific Gravity, Urine: 1.024 (ref 1.005–1.030)
pH: 5.5 (ref 5.0–8.0)

## 2016-02-24 LAB — URINE MICROSCOPIC-ADD ON: WBC UA: NONE SEEN WBC/hpf (ref 0–5)

## 2016-02-24 LAB — CBC WITH DIFFERENTIAL/PLATELET
BASOS PCT: 0 %
Basophils Absolute: 0 10*3/uL (ref 0.0–0.1)
EOS ABS: 0.2 10*3/uL (ref 0.0–0.7)
EOS PCT: 3 %
HCT: 45.5 % (ref 39.0–52.0)
Hemoglobin: 15 g/dL (ref 13.0–17.0)
LYMPHS ABS: 3.6 10*3/uL (ref 0.7–4.0)
Lymphocytes Relative: 38 %
MCH: 26.4 pg (ref 26.0–34.0)
MCHC: 33 g/dL (ref 30.0–36.0)
MCV: 80 fL (ref 78.0–100.0)
MONO ABS: 0.9 10*3/uL (ref 0.1–1.0)
MONOS PCT: 10 %
NEUTROS PCT: 49 %
Neutro Abs: 4.6 10*3/uL (ref 1.7–7.7)
PLATELETS: 265 10*3/uL (ref 150–400)
RBC: 5.69 MIL/uL (ref 4.22–5.81)
RDW: 14 % (ref 11.5–15.5)
WBC: 9.4 10*3/uL (ref 4.0–10.5)

## 2016-02-24 LAB — LIPASE, BLOOD: Lipase: 27 U/L (ref 11–51)

## 2016-02-24 MED ORDER — OXYCODONE-ACETAMINOPHEN 5-325 MG PO TABS: 1.0000 | ORAL_TABLET | Freq: Four times a day (QID) | ORAL | Status: DC | PRN

## 2016-02-24 MED ORDER — ONDANSETRON HCL 4 MG/2ML IJ SOLN
4.0000 mg | Freq: Once | INTRAMUSCULAR | Status: AC
  Administered 2016-02-24: 4 mg via INTRAVENOUS
  Filled 2016-02-24: qty 2

## 2016-02-24 MED ORDER — HYDROMORPHONE HCL 1 MG/ML IJ SOLN
1.0000 mg | Freq: Once | INTRAMUSCULAR | Status: AC
  Administered 2016-02-24: 1 mg via INTRAVENOUS
  Filled 2016-02-24: qty 1

## 2016-02-24 MED ORDER — TAMSULOSIN HCL 0.4 MG PO CAPS
0.4000 mg | ORAL_CAPSULE | Freq: Every day | ORAL | Status: DC
  Administered 2016-02-24: 0.4 mg via ORAL
  Filled 2016-02-24: qty 1

## 2016-02-24 MED ORDER — TAMSULOSIN HCL 0.4 MG PO CAPS: 0.4000 mg | ORAL_CAPSULE | Freq: Every day | ORAL | Status: DC

## 2016-02-24 MED ORDER — KETOROLAC TROMETHAMINE 30 MG/ML IJ SOLN
30.0000 mg | Freq: Once | INTRAMUSCULAR | Status: AC
  Administered 2016-02-24: 30 mg via INTRAVENOUS
  Filled 2016-02-24: qty 1

## 2016-02-24 NOTE — ED Provider Notes (Signed)
CSN: [id number]     Arrival date & time 02/24/16  0602 History   First MD Initiated Contact with Patient 02/24/16 [phone]     Chief Complaint  Patient presents with  . Flank Pain     HPI  Thomas [name] is an 35 y.o. male with no significant PMH who presents to the ED for evaluation of left flank pain. He states he was in his usual state of health until early this AM when he woke up to use the restroom. He states he urinated, went back to bed, but then got up because he felt like needed to urinate again. He states that at that time he was only able to urinate a small amount but it felt like he needed to go more. He states then he started experiencing a sudden onset sharp left back pain that has now in the ED radiated down to the LLQ of his abdomen. He denies n/v. States the pain is constant and severe. He has not taken anything to alleviate his symptoms. States he has a strong fam hx of kidney stones, but has never been diagnosed with a kidney stone himself.  History reviewed. No pertinent past medical history. History reviewed. No pertinent past surgical history. Family History  Problem Relation Age of Onset  . Diabetes Father   . Kidney Stones Father    Social History  Substance Use Topics  . Smoking status: Former Games developer  . Smokeless tobacco: Never Used  . Alcohol Use: No    Review of Systems  All other systems reviewed and are negative.     Allergies  Penicillins  Home Medications   Prior to Admission medications   Not on File   BP 149/110 mmHg  Pulse 91  Temp(Src) 97.8 F (36.6 C) (Oral)  Resp 18  SpO2 99% Physical Exam  Constitutional: He is oriented to person, place, and time.  Pacing around room, diaphoretic, appears extremely uncomfortable  HENT:  Right Ear: External ear normal.  Left Ear: External ear normal.  Nose: Nose normal.  Eyes: Conjunctivae and EOM are normal.  Neck: Neck supple.  Cardiovascular: Normal rate, regular rhythm, normal heart sounds  and intact distal pulses.   Pulmonary/Chest: Effort normal and breath sounds normal. No respiratory distress. He has no wheezes.  Abdominal: Soft. Bowel sounds are normal. He exhibits no distension. There is tenderness in the left lower quadrant. There is no rebound and no guarding.  +L CVA tenderness  Musculoskeletal: He exhibits no edema.  Neurological: He is alert and oriented to person, place, and time. No cranial nerve deficit.  Skin: Skin is warm and dry.  Psychiatric: He has a normal mood and affect.  Nursing note and vitals reviewed.   ED Course  Procedures (including critical care time) Labs Review Labs Reviewed  COMPREHENSIVE METABOLIC PANEL - Abnormal; Notable for the following:    Glucose, Bld 126 (*)    All other components within normal limits  URINALYSIS, ROUTINE W REFLEX MICROSCOPIC (NOT AT [facility]) - Abnormal; Notable for the following:    APPearance CLOUDY (*)    Hgb urine dipstick LARGE (*)    All other components within normal limits  URINE MICROSCOPIC-ADD ON - Abnormal; Notable for the following:    Squamous Epithelial / LPF 0-5 (*)    Bacteria, UA RARE (*)    All other components within normal limits  LIPASE, BLOOD  CBC WITH DIFFERENTIAL/PLATELET    Imaging Review Ct Renal Stone Study  02/24/2016  CLINICAL  DATA:  Sudden onset left side flank pain. Urinary frequency and urgency since 5 o'clock this morning EXAM: CT ABDOMEN AND PELVIS WITHOUT CONTRAST TECHNIQUE: Multidetector CT imaging of the abdomen and pelvis was performed following the standard protocol without IV contrast. COMPARISON:  None. FINDINGS: Lower chest: Lung bases are clear. No effusions. Heart is normal size. Hepatobiliary: No focal hepatic abnormality. Gallbladder unremarkable. Pancreas: No focal abnormality or ductal dilatation. Spleen: No focal abnormality.  Normal size. Adrenals/Urinary Tract: 4 mm distal left ureteral stone with mild left hydronephrosis. No renal or right ureteral stones. Adrenal  glands and urinary bladder grossly unremarkable. Stomach/Bowel: Appendix is visualized and is normal. Stomach, large and small bowel grossly unremarkable. Vascular/Lymphatic: No evidence of aneurysm or adenopathy. Reproductive: No visible abnormality Other: No free fluid or free air. Musculoskeletal: No acute bony abnormality or focal bone lesion. IMPRESSION: 4 mm distal left ureteral stone with mild left hydronephrosis. Electronically Signed   By: Charlett NoseKevin  Dover M.D.   On: 02/24/2016 07:33   I have personally reviewed and evaluated these images and lab results as part of my medical decision-making.   EKG Interpretation None      MDM   Final diagnoses:  Left ureteral stone    Pain resolved with pain meds in the ED. UA with large hgb, no infection. Good kidney function. CT does reveal 4mm distal left ureteral stone with mild left hydronephrosis. Will give pain meds and flomax. Instructed close urology follow up. ER return precautions given.     Carlene CoriaSerena Y Demitrios Molyneux, PA-C 02/24/16 [id number]  Loren Raceravid Yelverton, MD 02/27/16 [phone]

## 2016-02-24 NOTE — Discharge Instructions (Signed)
Kidney Stones °Kidney stones (urolithiasis) are deposits that form inside your kidneys. The intense pain is caused by the stone moving through the urinary tract. When the stone moves, the ureter goes into spasm around the stone. The stone is usually passed in the urine.  °CAUSES  °· A disorder that makes certain neck glands produce too much parathyroid hormone (primary hyperparathyroidism). °· A buildup of uric acid crystals, similar to gout in your joints. °· Narrowing (stricture) of the ureter. °· A kidney obstruction present at birth (congenital obstruction). °· Previous surgery on the kidney or ureters. °· Numerous kidney infections. °SYMPTOMS  °· Feeling sick to your stomach (nauseous). °· Throwing up (vomiting). °· Blood in the urine (hematuria). °· Pain that usually spreads (radiates) to the groin. °· Frequency or urgency of urination. °DIAGNOSIS  °· Taking a history and physical exam. °· Blood or urine tests. °· CT scan. °· Occasionally, an examination of the inside of the urinary bladder (cystoscopy) is performed. °TREATMENT  °· Observation. °· Increasing your fluid intake. °· Extracorporeal shock wave lithotripsy--This is a noninvasive procedure that uses shock waves to break up kidney stones. °· Surgery may be needed if you have severe pain or persistent obstruction. There are various surgical procedures. Most of the procedures are performed with the use of small instruments. Only small incisions are needed to accommodate these instruments, so recovery time is minimized. °The size, location, and chemical composition are all important variables that will determine the proper choice of action for you. Talk to your health care provider to better understand your situation so that you will minimize the risk of injury to yourself and your kidney.  °HOME CARE INSTRUCTIONS  °· Drink enough water and fluids to keep your urine clear or pale yellow. This will help you to pass the stone or stone fragments. °· Strain  all urine through the provided strainer. Keep all particulate matter and stones for your health care provider to see. The stone causing the pain may be as small as a grain of salt. It is very important to use the strainer each and every time you pass your urine. The collection of your stone will allow your health care provider to analyze it and verify that a stone has actually passed. The stone analysis will often identify what you can do to reduce the incidence of recurrences. °· Only take over-the-counter or prescription medicines for pain, discomfort, or fever as directed by your health care provider. °· Keep all follow-up visits as told by your health care provider. This is important. °· Get follow-up X-rays if required. The absence of pain does not always mean that the stone has passed. It may have only stopped moving. If the urine remains completely obstructed, it can cause loss of kidney function or even complete destruction of the kidney. It is your responsibility to make sure X-rays and follow-ups are completed. Ultrasounds of the kidney can show blockages and the status of the kidney. Ultrasounds are not associated with any radiation and can be performed easily in a matter of minutes. °· Make changes to your daily diet as told by your health care provider. You may be told to: °¨ Limit the amount of salt that you eat. °¨ Eat 5 or more servings of fruits and vegetables each day. °¨ Limit the amount of meat, poultry, fish, and eggs that you eat. °· Collect a 24-hour urine sample as told by your health care provider. You may need to collect another urine sample every 6-12   months. °SEEK MEDICAL CARE IF: °· You experience pain that is progressive and unresponsive to any pain medicine you have been prescribed. °SEEK IMMEDIATE MEDICAL CARE IF:  °· Pain cannot be controlled with the prescribed medicine. °· You have a fever or shaking chills. °· The severity or intensity of pain increases over 18 hours and is not  relieved by pain medicine. °· You develop a new onset of abdominal pain. °· You feel faint or pass out. °· You are unable to urinate. °  °This information is not intended to replace advice given to you by your health care provider. Make sure you discuss any questions you have with your health care provider. °  °Document Released: 09/17/2005 Document Revised: 06/08/2015 Document Reviewed: 02/18/2013 °Elsevier Interactive Patient Education ©2016 [employer]. ° °

## 2016-02-24 NOTE — ED Notes (Signed)
Per pt, he experienced sudden left sided flank pain. Pt c/o urinary urgency and frequency since 0500 this am.

## 2016-07-14 ENCOUNTER — Ambulatory Visit (INDEPENDENT_AMBULATORY_CARE_PROVIDER_SITE_OTHER): Payer: Managed Care, Other (non HMO)

## 2016-07-14 DIAGNOSIS — Z23 Encounter for immunization: Secondary | ICD-10-CM | POA: Diagnosis not present

## 2016-10-04 ENCOUNTER — Encounter: Payer: Self-pay | Admitting: Family Medicine

## 2016-10-04 ENCOUNTER — Ambulatory Visit (INDEPENDENT_AMBULATORY_CARE_PROVIDER_SITE_OTHER): Payer: Managed Care, Other (non HMO) | Admitting: Family Medicine

## 2016-10-04 VITALS — BP 136/79 | HR 99 | Temp 97.0°F | Ht 77.0 in | Wt 345.0 lb

## 2016-10-04 DIAGNOSIS — J0100 Acute maxillary sinusitis, unspecified: Secondary | ICD-10-CM

## 2016-10-04 MED ORDER — DOXYCYCLINE HYCLATE 100 MG PO TABS: 100.0000 mg | ORAL_TABLET | Freq: Two times a day (BID) | ORAL | 0 refills | Status: DC

## 2016-10-04 NOTE — Progress Notes (Signed)
   Subjective:    Patient ID: Thomas PippinsKurt D Derick, male    DOB: 12/28/1980, 36 y.o.   MRN: [id number]  HPI Patient here today for cough, congestion, and sinus. Complains of jaw pain sputum is yellowish green. Ears feel stopped up. Symptoms of been present for about one week. He has taken some OTC Mucinex DM for relief    Patient Active Problem List   Diagnosis Date Noted  . PLEVA (pityriasis lichenoides et varioliformis acuta) 12/28/2015   Outpatient Encounter Prescriptions as of 10/04/2016  Medication Sig  . [DISCONTINUED] oxyCODONE-acetaminophen (PERCOCET/ROXICET) 5-325 MG tablet Take 1-2 tablets by mouth every 6 (six) hours as needed for severe pain.  . [DISCONTINUED] tamsulosin (FLOMAX) 0.4 MG CAPS capsule Take 1 capsule (0.4 mg total) by mouth daily.   No facility-administered encounter medications on file as of 10/04/2016.      Review of Systems  Constitutional: Negative.  Negative for fever.  HENT: Positive for congestion, ear pain (stopped up), sinus pain and sinus pressure.   Eyes: Negative.   Respiratory: Positive for cough (slight).   Cardiovascular: Negative.   Gastrointestinal: Negative.   Endocrine: Negative.   Genitourinary: Negative.   Musculoskeletal: Negative.   Skin: Negative.   Allergic/Immunologic: Negative.   Neurological: Negative.   Hematological: Negative.   Psychiatric/Behavioral: Negative.        Objective:   Physical Exam  Constitutional: He appears well-developed and well-nourished.  HENT:  Right Ear: External ear normal.  Left Ear: External ear normal.  Mouth/Throat: Oropharynx is clear and moist.  Maxillary sinuses are tender to percussion  Cardiovascular: Normal rate, regular rhythm and normal heart sounds.   Pulmonary/Chest: Effort normal.    BP 136/79 (BP Location: Right Arm)   Pulse 99   Temp 97 F (36.1 C) (Oral)   Ht 6\' 5"  (1.956 m)   Wt (!) 345 lb (156.5 kg)   BMI 40.91 kg/m        Assessment & Plan:  1. Acute non-recurrent  maxillary sinusitis Patient is allergic to penicillins he artery has some doxycycline at home that he took for dermatology problem and even though that's insert second line I think it will help his sinus infection. Also recommended Flonase or Nasonex and continuation of Mucinex.  Frederica KusterStephen M Miller MD

## 2017-01-14 ENCOUNTER — Encounter: Payer: Self-pay | Admitting: Neurology

## 2017-01-14 ENCOUNTER — Encounter: Payer: Self-pay | Admitting: Family Medicine

## 2017-01-14 ENCOUNTER — Ambulatory Visit (INDEPENDENT_AMBULATORY_CARE_PROVIDER_SITE_OTHER): Payer: 59 | Admitting: Family Medicine

## 2017-01-14 VITALS — BP 134/92 | HR 92 | Temp 98.9°F | Ht 77.0 in | Wt 348.0 lb

## 2017-01-14 DIAGNOSIS — Z0000 Encounter for general adult medical examination without abnormal findings: Secondary | ICD-10-CM

## 2017-01-14 DIAGNOSIS — R7301 Impaired fasting glucose: Secondary | ICD-10-CM

## 2017-01-14 DIAGNOSIS — Z13220 Encounter for screening for lipoid disorders: Secondary | ICD-10-CM

## 2017-01-14 DIAGNOSIS — R51 Headache: Secondary | ICD-10-CM

## 2017-01-14 DIAGNOSIS — R519 Headache, unspecified: Secondary | ICD-10-CM

## 2017-01-14 DIAGNOSIS — G5711 Meralgia paresthetica, right lower limb: Secondary | ICD-10-CM

## 2017-01-14 LAB — BAYER DCA HB A1C WAIVED: HB A1C (BAYER DCA - WAIVED): 6.2 % (ref <7.0)

## 2017-01-14 NOTE — Progress Notes (Signed)
 BP (!) 134/92   Pulse 92   Temp 98.9 F (37.2 C) (Oral)   Ht 6' 5" (1.956 m)   Wt (!) 348 lb (157.9 kg)   BMI 41.27 kg/m    Subjective:    Patient ID: Thomas [name], male    DOB: 11/03/1980, 36 y.o.   MRN: [id number]  HPI: Thomas [name] is a 36 y.o. male presenting on 01/14/2017 for Annual Exam (patient is fasting, would like to have A1C checked, family history of diabetes; episodes of jolting pain in left side of head,since last fall, happens sporadically; numbness and burning in right side of leg)   HPI Adult well exam Patient is coming in for adult well exam and labs. He is fasting today. He says the only major issues been having is he does have a numbness on the right lateral aspect of his thigh and sometimes gets recurrent headaches. He says the numbness on his right lateral thigh will sometimes turn into a burning sensation in that it would be numb there. He denies any back pain or right hip pain or any damage or trauma to that side of his leg. He says is been going on for a few months. He says is been getting these headaches that are mostly left-sided and start in the back of his neck and send like jolts or spasms up over the top of his head on that left side. He denies any vision troubles or numbness or weakness. Otherwise he feels pretty healthy and denies any major health issues. He would like to be checked for diabetes because it does run extensively in his family and is especially concerned because of the nerve pain since been having.  Relevant past medical, surgical, family and social history reviewed and updated as indicated. Interim medical history since our last visit reviewed. Allergies and medications reviewed and updated.  Review of Systems  Constitutional: Negative for chills and fever.  HENT: Negative for ear pain and tinnitus.   Eyes: Negative for pain.  Respiratory: Negative for cough, shortness of breath and wheezing.   Cardiovascular: Negative for chest pain,  palpitations and leg swelling.  Gastrointestinal: Negative for abdominal pain, blood in stool, constipation and diarrhea.  Genitourinary: Negative for dysuria and hematuria.  Musculoskeletal: Negative for back pain and myalgias.  Skin: Negative for rash.  Neurological: Positive for numbness and headaches. Negative for dizziness and weakness.  Psychiatric/Behavioral: Negative for suicidal ideas.    Per HPI unless specifically indicated above      Objective:    BP (!) 134/92   Pulse 92   Temp 98.9 F (37.2 C) (Oral)   Ht 6' 5" (1.956 m)   Wt (!) 348 lb (157.9 kg)   BMI 41.27 kg/m   Wt Readings from Last 3 Encounters:  01/14/17 (!) 348 lb (157.9 kg)  10/04/16 (!) 345 lb (156.5 kg)  12/28/15 (!) 344 lb 6.4 oz (156.2 kg)    Physical Exam  Constitutional: He is oriented to person, place, and time. He appears well-developed and well-nourished. No distress.  HENT:  Right Ear: External ear normal.  Left Ear: External ear normal.  Nose: Nose normal.  Mouth/Throat: Oropharynx is clear and moist. No oropharyngeal exudate.  Eyes: Conjunctivae and EOM are normal. Pupils are equal, round, and reactive to light. No scleral icterus.  Neck: Neck supple. No thyromegaly present.  Cardiovascular: Normal rate, regular rhythm, normal heart sounds and intact distal pulses.   No murmur heard. Pulmonary/Chest: Effort normal and   breath sounds normal. No respiratory distress. He has no wheezes. He has no rales.  Abdominal: Soft. Bowel sounds are normal. He exhibits no distension. There is no tenderness. There is no rebound and no guarding.  Musculoskeletal: Normal range of motion. He exhibits no edema.  Lymphadenopathy:    He has no cervical adenopathy.  Neurological: He is alert and oriented to person, place, and time. A sensory deficit (Sensory deficit over right lateral thigh in an area matching lateral femoral cutaneous nerve) is present. No cranial nerve deficit. Coordination normal.  Skin:  Skin is warm and dry. No rash noted. He is not diaphoretic.  Psychiatric: He has a normal mood and affect. His behavior is normal.  Vitals reviewed.      Assessment & Plan:   Problem List Items Addressed This Visit    None    Visit Diagnoses    Well adult exam    -  Primary   Relevant Orders   CMP14+EGFR   Lipid panel   Bayer DCA Hb A1c Waived   Lipid screening       Relevant Orders   Lipid panel   Elevated fasting glucose       Relevant Orders   CMP14+EGFR   Bayer DCA Hb A1c Waived   Lateral femoral cutaneous neuropathy, right       Relevant Orders   Vitamin B12   Folate   TSH   Ambulatory referral to Neurology   Recurrent headache       Relevant Orders   Vitamin B12   Folate   TSH   Ambulatory referral to Neurology       Follow up plan: Return in about 4 weeks (around 02/11/2017), or if symptoms worsen or fail to improve, for Blood pressure recheck.  Counseling provided for all of the vaccine components Orders Placed This Encounter  Procedures  . CMP14+EGFR  . Lipid panel  . Bayer DCA Hb A1c Waived    Joshua Dettinger, MD [facility] 01/14/2017, 11:08 AM     

## 2017-01-15 LAB — CMP14+EGFR
A/G RATIO: 1.5 (ref 1.2–2.2)
ALK PHOS: 78 IU/L (ref 39–117)
ALT: 28 IU/L (ref 0–44)
AST: 17 IU/L (ref 0–40)
Albumin: 4.2 g/dL (ref 3.5–5.5)
BILIRUBIN TOTAL: 0.5 mg/dL (ref 0.0–1.2)
BUN/Creatinine Ratio: 13 (ref 9–20)
BUN: 13 mg/dL (ref 6–20)
CHLORIDE: 101 mmol/L (ref 96–106)
CO2: 24 mmol/L (ref 18–29)
Calcium: 9.5 mg/dL (ref 8.7–10.2)
Creatinine, Ser: 0.98 mg/dL (ref 0.76–1.27)
GFR calc Af Amer: 116 mL/min/{1.73_m2} (ref >59)
GFR calc non Af Amer: 100 mL/min/{1.73_m2} (ref >59)
GLOBULIN, TOTAL: 2.8 g/dL (ref 1.5–4.5)
Glucose: 99 mg/dL (ref 65–99)
POTASSIUM: 4.7 mmol/L (ref 3.5–5.2)
SODIUM: 142 mmol/L (ref 134–144)
Total Protein: 7 g/dL (ref 6.0–8.5)

## 2017-01-15 LAB — LIPID PANEL
CHOL/HDL RATIO: 5.9 ratio — AB (ref 0.0–5.0)
Cholesterol, Total: 183 mg/dL (ref 100–199)
HDL: 31 mg/dL — AB (ref >39)
LDL Calculated: 103 mg/dL — ABNORMAL HIGH (ref 0–99)
Triglycerides: 246 mg/dL — ABNORMAL HIGH (ref 0–149)
VLDL Cholesterol Cal: 49 mg/dL — ABNORMAL HIGH (ref 5–40)

## 2017-01-15 LAB — TSH: TSH: 2.91 u[IU]/mL (ref 0.450–4.500)

## 2017-01-15 LAB — VITAMIN B12: Vitamin B-12: 384 pg/mL (ref 232–1245)

## 2017-01-15 LAB — FOLATE: Folate: 8.3 ng/mL (ref >3.0)

## 2017-01-24 ENCOUNTER — Ambulatory Visit (INDEPENDENT_AMBULATORY_CARE_PROVIDER_SITE_OTHER): Payer: 59 | Admitting: Pharmacist

## 2017-01-24 ENCOUNTER — Encounter: Payer: Self-pay | Admitting: Pharmacist

## 2017-01-24 VITALS — BP 128/90 | HR 88 | Ht 77.0 in | Wt 342.2 lb

## 2017-01-24 DIAGNOSIS — R7303 Prediabetes: Secondary | ICD-10-CM

## 2017-01-24 DIAGNOSIS — E1169 Type 2 diabetes mellitus with other specified complication: Secondary | ICD-10-CM | POA: Insufficient documentation

## 2017-01-24 DIAGNOSIS — Z6841 Body Mass Index (BMI) 40.0 and over, adult: Secondary | ICD-10-CM | POA: Diagnosis not present

## 2017-01-24 NOTE — Patient Instructions (Signed)
Prediabetes Many people have heard about type 2 diabetes, but its common precursor, prediabetes, doesn't get as much attention. Prediabetes is estimated by CDC to affect 86 million Americans (this includes 51% of people 65 years and older), and an estimated 90% of people with prediabetes don't even know it. According to the CDC, 15-30% of these individuals will develop type 2 diabetes within five years. In other words, as many as 26 million people that currently have prediabetes could develop type 2 diabetes by 2020, effectively doubling the number of people with type 2 diabetes in the [place].  What is prediabetes? Prediabetes is a condition where blood sugar levels are higher than normal, but not high enough to be diagnosed as type 2 diabetes. This occurs when the body has problems in processing glucose properly, and sugar starts to build up in the bloodstream instead of fueling cells in muscles and tissues. Insulin is the hormone that tells cells to take up glucose, and in prediabetes, people typically initially develop insulin resistance (where the body's cells can't respond to insulin as well), and over time (if no actions are taken to reverse the situation) the ability to produce sufficient insulin is reduced. People with prediabetes also commonly have high blood pressure as well as abnormal blood lipids (e.g. cholesterol). These often occur prior to the rise of blood glucose levels.  What are the symptoms of prediabetes? People typically do not have symptoms of prediabetes, which is partially why up to 90% of people don't know they have it. The ADA reports that some people with prediabetes may develop symptoms of type 2 diabetes, though even many people diagnosed with type 2 diabetes show little or no symptoms initially at diagnosis.  How is prediabetes diagnosed? According to the American Diabetes Association, prediabetes can be diagnosed through one of the following tests: 1. A glycated hemoglobin  test, also known as HbA1c or simply A1c, gives an idea of the body's average blood sugar levels from the past two or three months. It is usually done with a small drop of blood from a fingerstick or as part of having blood taken in a doctor's office, hospital, or laboratory. A1c Level Diagnosis  Less than 5.7% Normal  5.7% to 6.4% Prediabetes  6.5% and higher Diabetes  2. A fasting plasma glucose (FPG) test measures a person's blood glucose level after fasting (not eating) for eight hours - this is typically done in the morning. If a test shows positive for prediabetes, a second test should be taken on a different day to confirm the diagnosis. FPG Level Diagnosis  Less than 100 mg/dl Normal  100 mg/dl to 125 mg/dl Prediabetes  126 mg/dl and higher Diabetes   Who is at risk of developing prediabetes? A well-known paper published in the Lancet in 2010 recommends screening for type 2 diabetes (which would also screen for prediabetes) every 3-5 years in all adults over the age of 45, regardless of other risk factors. Overweight and obese adults (a BMI >25 kg/m2) are also at significantly greater risk for developing prediabetes, as well as people with a family history of type 2 diabetes. According to the CDC, several other factors can have moderate influences on prediabetes risk in addition to age, weight, and family history: People with an African American, Hispanic/Latino, American Indian, Asian American, or Pacific Islander racial or ethnic background. The 2015 ADA Standards of Medical Care recommendations suggest Asian Americans with a BMI of 23 or above be screened for type 2 diabetes.    Women with a history of diabetes during pregnancy ("gestational diabetes") or have given birth to a baby weighing nine pounds or more. People who are physically active fewer than three times a week. The CDC offers a fast, online screening test for evaluating the risk for prediabetes. The ADA also offers a screening  test to assess type 2 diabetes risk. Of course, these tests do not themselves confirm a prediabetes diagnosis, but just if someone may be at higher risk of developing it.  Why do people develop prediabetes? Prediabetes develops through a combination of factors that are still being investigated. For sure, lifestyle factors (food, exercise, stress, sleep) play a role, but family history and genetics certainly do as well. It is easy to assume that prediabetes is the result of being overweight, but the relationship is not that simple. While obesity is one underlying cause of insulin resistance, many overweight individuals may never develop prediabetes or type 2 diabetes, and a minority of people with prediabetes have never been overweight. To make matters worse, it can be increasingly difficult to make healthy choices in today's toxic food environment that steers all of us to make the wrong food choices, and there are many factors that can contribute to weight gain in addition to diet.  Is a prediabetes diagnosis serious? There has been significant debate around the term 'prediabetes,' and whether it should be considered cause for alarm. On the one hand, it serves as a risk factor for type 2 diabetes and a host of other complications, including heart disease, and ultimately prediabetes implies that a degree of metabolic problems have started to occur in the body. On the other hand, it places a diagnosis on many people who may never develop type 2 diabetes. Again, according to the CDC, 15-30% of those with prediabetes will develop type 2 diabetes within five years. However, a 2012 Lancet article cites 5-10% of those with prediabetes each year will also revert back to healthy blood sugars. What's critical is not necessarily the cutoff itself, but where someone falls within the ranges listed above. The level of risk of developing type 2 diabetes is closely related to A1c or FPG at diagnosis. Those in the higher ranges  (A1c closer to 6.4%, FPG closer to 125 mg/dl) are much more likely to progress to type 2 diabetes, whereas those at lower ranges (A1c closer to 5.7%, FPG closer to 100 mg/dl) are relatively more likely to revert back to normal glucose levels or stay within the prediabetes range. Age of diagnosis and the level of insulin production still occurring at diagnosis also impact the chances of reverting to normoglycemia (normal blood sugar levels).  What can people with prediabetes do to avoid the progression from prediabetes to type 2 diabetes? The most important action people diagnosed with prediabetes can take is to focus on living a healthy lifestyle. This includes making healthy food choices, controlling portions, and increasing physical activity. Regarding weight control, research shows losing 5-7% (often about 10-20 lbs.) from your initial body weight and keeping off as much of that weight over time as possible is critical to lowering the risk of type 2 diabetes. This task is of course easier said than done, but sustained weight loss over time can be key to improving health and delaying or preventing the onset of type 2 diabetes. Several prediabetes interventions exist based on evidence from the landmark Diabetes Prevention Program (DPP) study. The DPP study reported that moderate weight loss (5-7% of body weight, or ~10-15 lbs.   for someone weighing 200 lbs.), counseling, and education on healthy eating and behavior reduced the risk of developing type 2 diabetes by 58%. Data presented at the ADA 2014 conference showed that after 15 years of follow-up of the DPP study groups, the results were still encouraging: 27% of those in the original lifestyle group had a significant reduction in type 2 diabetes progression compared to the control group. If you or someone you know has been told they have prediabetes, here are a few helpful resources: In-person diabetes prevention programs: The CDC offers a one year long  lifestyle change program through its National Diabetes Prevention Program (NDPP) at various locations throughout the [place] to help participants adopt healthy habits and prevent or delay progression to type 2 diabetes. This program is a major undertaking by the CDC to translate the findings from the DPP study into a real world setting, a significant effort indeed! Online diabetes prevention programs: The CDC has now given pending recognition status to three digital prevention programs: [facility], [facility], and [facility]. These offer the same one year long educational curriculum as the DPP study, but in an online format. Some insurance companies and employers cover these programs, and you can find more information at the links above. These digital versions are excellent options for those who live far away from NDPP locations or who prefer the anonymity and convenience of doing the program online. Metformin: The DPP study found that metformin, the safest first-line therapy for type 2 diabetes, may help delay the onset of type 2 diabetes in people with prediabetes. Participants who took the low-cost generic drug had a 31% reduced risk of developing type 2 diabetes compared to the control group (those not on metformin or intensive lifestyle intervention). Again, 15-year follow up data showed that 17% of those on metformin continued to have a significant reduction in type 2 progression. At this time, metformin (or any other medication, for that matter) is not currently FDA approved for prediabetes, and it is sometimes prescribed "off-label" by a healthcare provider. Your healthcare provider can give you more information and determine whether metformin is a good option for you.  Can prediabetes be "cured"? In the early stages of prediabetes (and type 2 diabetes), diligent attention to food choices and activity, and most importantly weight loss, can improve blood sugar numbers, effectively "reversing" the disease  and reducing the odds of developing type 2 diabetes. However, some people may have underlying factors (such as family history and genetics) that put them at a greater risk of type 2 diabetes, meaning they will always require careful attention to blood sugar levels and lifestyle choices. Returning to old habits will likely put someone back on the road to prediabetes, and eventually, type 2 diabetes   

## 2017-01-24 NOTE — Progress Notes (Signed)
Patient ID: Thomas [name], male   DOB: Jun 04, 1981, 36 y.o.   MRN: [id number]   Subjective:    Thomas [name] is a 36 y.o. male who presents for an initial evaluation of pre diabetes.  He has a family history of DM - father and paternal grandparents.  His last BMI was 41.40.    He is married and works full time for [employer], clinical (histocompatibility and immunogenetics).  He and his wife do not currently have children.    Diet:  He and his wife both share cooking and grocery shopping responsibilities.   Prior to diagnosis of pre DM they were eating out frequently - fast food, pizza and [place].   He was eating few vegetables and lots of bread and sweets.  He has always drank low CHO beverages such as water and unsweetened tea.   Over the last 2 weeks he has changed diet - Breakfast is usually pears and cottage cheese, lunch is leftovers and supper usually lean protein, sweet potato and non starchy vegetable like broccoli or asparagus.   He and his wife are planning meal by the week.   The following portions of the patient's history were reviewed and updated as appropriate: allergies, current medications, past family history, past medical history, past social history, past surgical history and problem list.    Objective:    BP 128/90   Pulse 88   Ht  (1.956 m)   Wt (!) 342 lb 4 oz (155.2 kg)   BMI 40.58 kg/m    A1c = 6.2% (01/14/2017)  Lab Review Glucose (mg/dL)  Date Value  95/28/4132 99  03/26/2015 94   Glucose, Bld (mg/dL)  Date Value  44/10/270 126 (H)   CO2 (mmol/L)  Date Value  01/14/2017 24  02/24/2016 24  03/26/2015 24   BUN (mg/dL)  Date Value  53/66/4403 13  02/24/2016 19  03/26/2015 22 (H)   Creatinine, Ser (mg/dL)  Date Value  47/42/5956 0.98  02/24/2016 1.20  03/26/2015 1.13      Assessment:   Pre diabetes Elevated Tg Elevated BP Obesity - weight has decreased about 6 lbs over last 2 weeks   Plan:    1.  Rx changes: none 2.  Discussed what prediabetes is and ways  to prevent developing type 2 DM 3. Set initial weight goal of less than 300lbs. When returns to office in 2 months would like to see weight less than 330 lbs 4. CHO counting diet discussed.  Reviewed CHO amount in various foods and how to read nutrition labels.  Discussed recommended serving sizes. Printed out sample diet with recipes.  5.  Recommended increase physical activity - goal is 150 minutes per week 6. Follow up: 2 months

## 2017-03-23 ENCOUNTER — Encounter (HOSPITAL_COMMUNITY): Payer: Self-pay | Admitting: *Deleted

## 2017-03-23 ENCOUNTER — Ambulatory Visit (HOSPITAL_COMMUNITY)
Admission: EM | Admit: 2017-03-23 | Discharge: 2017-03-23 | Disposition: A | Discharge status: Home / Self Care | Payer: 59 | Attending: Family Medicine | Admitting: Family Medicine

## 2017-03-23 ENCOUNTER — Ambulatory Visit (HOSPITAL_COMMUNITY): Payer: 59

## 2017-03-23 DIAGNOSIS — M79672 Pain in left foot: Secondary | ICD-10-CM

## 2017-03-23 DIAGNOSIS — Z880 Allergy status to penicillin: Secondary | ICD-10-CM | POA: Diagnosis not present

## 2017-03-23 DIAGNOSIS — Z79899 Other long term (current) drug therapy: Secondary | ICD-10-CM | POA: Diagnosis not present

## 2017-03-23 DIAGNOSIS — M722 Plantar fascial fibromatosis: Secondary | ICD-10-CM | POA: Diagnosis not present

## 2017-03-23 DIAGNOSIS — Z87891 Personal history of nicotine dependence: Secondary | ICD-10-CM | POA: Diagnosis not present

## 2017-03-23 HISTORY — DX: Plantar fascial fibromatosis: M72.2

## 2017-03-23 MED ORDER — METHYLPREDNISOLONE 4 MG PO TBPK: ORAL_TABLET | ORAL | 0 refills | Status: DC

## 2017-03-23 MED ORDER — OMEPRAZOLE 20 MG PO CPDR: 20.0000 mg | DELAYED_RELEASE_CAPSULE | Freq: Every day | ORAL | 0 refills | Status: DC

## 2017-03-23 NOTE — ED Provider Notes (Signed)
CSN: [id number]     Arrival date & time 03/23/17  1217 History   None    Chief Complaint  Patient presents with  . Foot Pain   (Consider location/radiation/quality/duration/timing/severity/associated sxs/prior Treatment) C/o left foot pain for 2 weeks. Has hx of plantar fascitis.  He sees Dr. Fonnie Jarvisrake Podiatrist   The history is provided by the patient.  Foot Pain  This is a new problem. The problem occurs constantly. The problem has not changed since onset.Nothing aggravates the symptoms. Nothing relieves the symptoms. He has tried nothing for the symptoms.    Past Medical History:  Diagnosis Date  . Plantar fasciitis    History reviewed. No pertinent surgical history. Family History  Problem Relation Age of Onset  . Diabetes Father   . Kidney Stones Father   . Hypertension Father   . Heart disease Maternal Grandmother   . COPD Maternal Grandmother   . Heart disease Maternal Grandfather   . Hypertension Maternal Grandfather   . Diabetes Paternal Grandmother   . Diabetes Paternal Grandfather   . Heart disease Paternal Grandfather    Social History  Substance Use Topics  . Smoking status: Former Games developermoker  . Smokeless tobacco: Never Used  . Alcohol use Yes     Comment: occasional    Review of Systems  Constitutional: Negative.   HENT: Negative.   Eyes: Negative.   Respiratory: Negative.   Cardiovascular: Negative.   Gastrointestinal: Negative.   Endocrine: Negative.   Genitourinary: Negative.   Musculoskeletal: Positive for arthralgias.  Allergic/Immunologic: Negative.   Neurological: Negative.   Hematological: Negative.   Psychiatric/Behavioral: Negative.     Allergies  Penicillins  Home Medications   Prior to Admission medications   Medication Sig Start Date End Date Taking? Authorizing Provider  IBUPROFEN PO Take 600 mg by mouth.   Yes [provider]  methylPREDNISolone (MEDROL DOSEPAK) 4 MG TBPK tablet Take 6-5-4-3-2-1 po qd 03/23/17   Deatra Canterxford,  Naara Kelty J, FNP  omeprazole (PRILOSEC) 20 MG capsule Take 1 capsule (20 mg total) by mouth daily. 03/23/17   Deatra Canterxford, Yadiel Aubry J, FNP   Meds Ordered and Administered this Visit  Medications - No data to display  BP 129/78   Pulse 81   Temp 98.4 F (36.9 C) (Oral)   Resp 14   SpO2 99%  No data found.   Physical Exam  Constitutional: He is oriented to person, place, and time. He appears well-developed and well-nourished.  HENT:  Head: Normocephalic and atraumatic.  Eyes: Conjunctivae and EOM are normal. Pupils are equal, round, and reactive to light.  Neck: Normal range of motion. Neck supple.  Cardiovascular: Normal rate, regular rhythm and normal heart sounds.   Pulmonary/Chest: Effort normal and breath sounds normal.  Abdominal: Soft. Bowel sounds are normal.  Neurological: He is alert and oriented to person, place, and time.  Nursing note and vitals reviewed.   Urgent Care Course     Procedures (including critical care time)  Labs Review Labs Reviewed - No data to display  Imaging Review Dg Foot Complete Left  Result Date: 03/23/2017 CLINICAL DATA:  Lateral foot metatarsal pain EXAM: LEFT FOOT - COMPLETE 3+ VIEW COMPARISON:  None available FINDINGS: There is no evidence of fracture or dislocation. There is no evidence of arthropathy or other focal bone abnormality. Soft tissues are unremarkable. IMPRESSION: Negative. Electronically Signed   By: Judie PetitM.  Shick M.D.   On: 03/23/2017 14:09     Visual Acuity Review  Right Eye Distance:  Left Eye Distance:   Bilateral Distance:    Right Eye Near:   Left Eye Near:    Bilateral Near:         MDM   1. Foot pain, left   2. Plantar fasciitis    Continue Ibuprofen or voltaren and add medrol dose pack as directed. Use frozen water bottle and rub left foot across  Follow up with Dr. Polly Cobia, Anselm Pancoast, FNP 03/23/17 1434

## 2017-03-23 NOTE — ED Triage Notes (Signed)
C/O gradual onset left lateral foot pain yesterday without injury.  Reports having cortisone inj to left heel 2 wks ago for plantar fasciitis.  States this pain feels different.

## 2017-03-23 NOTE — Discharge Instructions (Signed)
Take diclofenac or ibuprofen consistently for next week.  Follow up with Dr. Ulice Brilliantrake

## 2017-03-27 ENCOUNTER — Ambulatory Visit (INDEPENDENT_AMBULATORY_CARE_PROVIDER_SITE_OTHER): Payer: 59 | Admitting: Neurology

## 2017-03-27 ENCOUNTER — Encounter: Payer: Self-pay | Admitting: Neurology

## 2017-03-27 VITALS — BP 140/88 | HR 93 | Ht 77.0 in | Wt 342.0 lb

## 2017-03-27 DIAGNOSIS — G4486 Cervicogenic headache: Secondary | ICD-10-CM

## 2017-03-27 DIAGNOSIS — R51 Headache: Secondary | ICD-10-CM

## 2017-03-27 DIAGNOSIS — G5711 Meralgia paresthetica, right lower limb: Secondary | ICD-10-CM | POA: Diagnosis not present

## 2017-03-27 NOTE — Progress Notes (Signed)
NEUROLOGY CONSULTATION NOTE  Thomas [name] MRN: [id number] DOB: [birth year]  Referring provider: Dr. Louanne Skyeettinger Primary care provider: Dr. Louanne Skyeettinger  Reason for consult:  Headache, thigh numbness  HISTORY OF PRESENT ILLNESS: Thomas [name] is a 36 year old male who presents for headache and right thigh numbness.  He is accompanied by his wife who supplements history.  In October 2017, he developed left sided headache, associated with electric jolts from the back of his head to the front.  It subsided and then he would have a dull 1-2/10 left sided headache, usually lasting 2 to 4 hours and occurring once in awhile.  It resolved about 2 months ago.  He sometimes has left sided neck tenderness.  Rubbing his eye may aggravate it.  It resolves on its own, but he does take ibuprofen at times.  About 6 months ago, he developed numbness in the right lateral thigh.  Sometimes it is associated with itching.  He has history of low back pain but has not had back pain in quite awhile.  It is intermittent, occurring every one to two weeks for a couple of days.  There is no associated radicular pain or weakness of the leg.  He does not wear heavy belts.  On two occasions, he noted a vibration sensation on the bottom of his right foot, lasting a day. He has plantar fasciitis in his left foot.  PAST MEDICAL HISTORY: Past Medical History:  Diagnosis Date  . Plantar fasciitis     PAST SURGICAL HISTORY: History reviewed. No pertinent surgical history.  MEDICATIONS: Current Outpatient Prescriptions on File Prior to Visit  Medication Sig Dispense Refill  . IBUPROFEN PO Take 600 mg by mouth.     No current facility-administered medications on file prior to visit.     ALLERGIES: Allergies  Allergen Reactions  . Penicillins Hives and Rash    FAMILY HISTORY: Family History  Problem Relation Age of Onset  . Diabetes Father   . Kidney Stones Father   . Hypertension Father   . Heart disease  Maternal Grandmother   . COPD Maternal Grandmother   . Heart disease Maternal Grandfather   . Hypertension Maternal Grandfather   . Diabetes Paternal Grandmother   . Diabetes Paternal Grandfather   . Heart disease Paternal Grandfather     SOCIAL HISTORY: Social History   Social History  . Marital status: Married    Spouse name: N/A  . Number of children: N/A  . Years of education: N/A   Occupational History  . Not on file.   Social History Main Topics  . Smoking status: Former Games developermoker  . Smokeless tobacco: Never Used  . Alcohol use Yes     Comment: occasional  . Drug use: No  . Sexual activity: Not on file   Other Topics Concern  . Not on file   Social History Narrative  . No narrative on file    REVIEW OF SYSTEMS: Constitutional: No fevers, chills, or sweats, no generalized fatigue, change in appetite Eyes: No visual changes, double vision, eye pain Ear, nose and throat: No hearing loss, ear pain, nasal congestion, sore throat Cardiovascular: No chest pain, palpitations Respiratory:  No shortness of breath at rest or with exertion, wheezes GastrointestinaI: No nausea, vomiting, diarrhea, abdominal pain, fecal incontinence Genitourinary:  No dysuria, urinary retention or frequency Musculoskeletal:  No neck pain, back pain Integumentary: No rash, pruritus, skin lesions Neurological: as above Psychiatric: No depression, insomnia, anxiety Endocrine: No palpitations, fatigue, diaphoresis,  mood swings, change in appetite, change in weight, increased thirst Hematologic/Lymphatic:  No purpura, petechiae. Allergic/Immunologic: no itchy/runny eyes, nasal congestion, recent allergic reactions, rashes  PHYSICAL EXAM: Vitals:   03/27/17 1446  BP: 140/88  Pulse: 93   General: No acute distress.  Patient appears well-groomed.  Head:  Normocephalic/atraumatic Eyes:  fundi examined but not visualized Neck: supple, no paraspinal tenderness, full range of motion Back: No  paraspinal tenderness Heart: regular rate and rhythm Lungs: Clear to auscultation bilaterally. Vascular: No carotid bruits. Neurological Exam: Mental status: alert and oriented to person, place, and time, recent and remote memory intact, fund of knowledge intact, attention and concentration intact, speech fluent and not dysarthric, language intact. Cranial nerves: CN I: not tested CN II: pupils equal, round and reactive to light, visual fields intact CN III, IV, VI:  full range of motion, no nystagmus, no ptosis CN V: facial sensation intact CN VII: upper and lower face symmetric CN VIII: hearing intact CN IX, X: gag intact, uvula midline CN XI: sternocleidomastoid and trapezius muscles intact CN XII: tongue midline Bulk & Tone: normal, no fasciculations. Motor:  5/5 throughout  Sensation:  Decreased pinprick involving right lateral thigh; vibration sensation intact. Deep Tendon Reflexes:  1+ throughout, toes downgoing. Finger to nose testing:  Without dysmetria.  Heel to shin:  Without dysmetria.  Gait:  Normal station and stride.  Able to turn and tandem walk. Romberg negative.  IMPRESSION: 1.  Probable cervicogenic headache/occipital neuralgia, resolved 2.  Right meralgia paresthetica.   3.  Intermittent right foot numbness.  Only occurred twice.  Unclear etiology.  Lumbar radiculopathy possible but without back pain or radicular pain.  Consider plantar fasciitis as well. 4. Borderline morbid obesity (BMI 40.56)  PLAN: 1.  Recommend losing weight and avoiding heavy belts or wearing tight pants around the waist to see if it helps with the thigh numbness.   2.  If symptoms get worse, he may follow up.  40 minutes spent face to face with patient, over 50% spent discussing diagnosis and management.  Thank you for allowing me to take part in the care of this patient.  Shon Millet, DO  CC:  Arville Care, MD

## 2017-03-27 NOTE — Patient Instructions (Signed)
I think the headache was probably coming from the neck The numbness in the thigh is due to a nerve that is pinched in your groin.  I would work on losing weight and not wearing tight pants/heavy belts.  If symptoms get worse, please follow up.

## 2017-04-08 ENCOUNTER — Ambulatory Visit: Payer: Self-pay | Admitting: Pharmacist

## 2017-04-10 ENCOUNTER — Ambulatory Visit (INDEPENDENT_AMBULATORY_CARE_PROVIDER_SITE_OTHER): Payer: 59 | Admitting: Orthopedic Surgery

## 2018-01-15 ENCOUNTER — Encounter: Payer: Self-pay | Admitting: Family Medicine

## 2018-01-15 ENCOUNTER — Ambulatory Visit: Payer: 59 | Admitting: Family Medicine

## 2018-01-15 VITALS — BP 132/86 | HR 99 | Temp 97.8°F | Ht 77.0 in | Wt 340.0 lb

## 2018-01-15 DIAGNOSIS — B9689 Other specified bacterial agents as the cause of diseases classified elsewhere: Secondary | ICD-10-CM

## 2018-01-15 DIAGNOSIS — J019 Acute sinusitis, unspecified: Secondary | ICD-10-CM

## 2018-01-15 MED ORDER — CEFDINIR 300 MG PO CAPS: 300.0000 mg | ORAL_CAPSULE | Freq: Two times a day (BID) | ORAL | 0 refills | Status: DC

## 2018-01-15 NOTE — Patient Instructions (Signed)
-   Get plenty of rest and drink plenty of fluids. - Try to breathe moist air. Use a cold mist humidifier. - Consume warm fluids (soup or tea) to provide relief for a stuffy nose and to loosen phlegm. - For nasal stuffiness, try saline nasal spray or a Neti Pot. Afrin nasal spray can also be used but this product should not be used longer than 3 days or it will cause rebound nasal stuffiness (worsening nasal congestion). - For sore throat pain relief: suck on throat lozenges, hard candy or popsicles; gargle with warm salt water (1/4 tsp. salt per 8 oz. of water); and eat soft, bland foods. - Eat a well-balanced diet. If you cannot, ensure you are getting enough nutrients by taking a daily multivitamin. - Avoid dairy products, as they can thicken phlegm. - Avoid alcohol, as it impairs your body's immune system.   Sinusitis, Adult Sinusitis is soreness and inflammation of your sinuses. Sinuses are hollow spaces in the bones around your face. They are located:  Around your eyes.  In the middle of your forehead.  Behind your nose.  In your cheekbones.  Your sinuses and nasal passages are lined with a stringy fluid (mucus). Mucus normally drains out of your sinuses. When your nasal tissues get inflamed or swollen, the mucus can get trapped or blocked so air cannot flow through your sinuses. This lets bacteria, viruses, and funguses grow, and that leads to infection. Follow these instructions at home: Medicines  Take, use, or apply over-the-counter and prescription medicines only as told by your doctor. These may include nasal sprays.  If you were prescribed an antibiotic medicine, take it as told by your doctor. Do not stop taking the antibiotic even if you start to feel better. Hydrate and Humidify  Drink enough water to keep your pee (urine) clear or pale yellow.  Use a cool mist humidifier to keep the humidity level in your home above 50%.  Breathe in steam for 10-15 minutes, 3-4  times a day or as told by your doctor. You can do this in the bathroom while a hot shower is running.  Try not to spend time in cool or dry air. Rest  Rest as much as possible.  Sleep with your head raised (elevated).  Make sure to get enough sleep each night. General instructions  Put a warm, moist washcloth on your face 3-4 times a day or as told by your doctor. This will help with discomfort.  Wash your hands often with soap and water. If there is no soap and water, use hand sanitizer.  Do not smoke. Avoid being around people who are smoking (secondhand smoke).  Keep all follow-up visits as told by your doctor. This is important. Contact a doctor if:  You have a fever.  Your symptoms get worse.  Your symptoms do not get better within 10 days. Get help right away if:  You have a very bad headache.  You cannot stop throwing up (vomiting).  You have pain or swelling around your face or eyes.  You have trouble seeing.  You feel confused.  Your neck is stiff.  You have trouble breathing. This information is not intended to replace advice given to you by your health care provider. Make sure you discuss any questions you have with your health care provider. Document Released: 03/05/2008 Document Revised: 05/13/2016 Document Reviewed: 07/13/2015 Elsevier Interactive Patient Education  2018 Elsevier Inc[employer]    

## 2018-01-15 NOTE — Progress Notes (Signed)
Subjective: CC: sinus symptoms PCP: Dettinger, Thomas Radon, MD ZOX:WRUE D [name] is a 37 y.o. male presenting to clinic today for:  1. Sinus symptoms  Patient reports facial pain and pressure, sinus pressure, headache, scratchy throat, purulence and bloody discharge from the nares.  He notes mild cough and congestion.  Denies hemoptysis, SOB, dizziness, rash, nausea, vomiting, diarrhea, fevers, chills, myalgia, sick contacts, recent travel.  Patient has used Mucinex with little relief of symptoms.  Denies history of COPD or asthma.  Denies tobacco use/ exposure.  He notes that he missed work yesterday and today secondary to illness.  He has to wear a respirator at work and is unable to do so because of sinus infection.   ROS: Per HPI  Allergies  Allergen Reactions  . Penicillins Hives and Rash   Past Medical History:  Diagnosis Date  . Plantar fasciitis     Current Outpatient Medications:  .  diclofenac (VOLTAREN) 75 MG EC tablet, Take 1 Tablet by mouth 2 times a day after meals, Disp: , Rfl: 2 .  IBUPROFEN PO, Take 600 mg by mouth., Disp: , Rfl:  Social History   Socioeconomic History  . Marital status: Married    Spouse name: Not on file  . Number of children: Not on file  . Years of education: Not on file  . Highest education level: Not on file  Occupational History  . Not on file  Social Needs  . Financial resource strain: Not on file  . Food insecurity:    Worry: Not on file    Inability: Not on file  . Transportation needs:    Medical: Not on file    Non-medical: Not on file  Tobacco Use  . Smoking status: Former Games developer  . Smokeless tobacco: Never Used  Substance and Sexual Activity  . Alcohol use: Yes    Comment: occasional  . Drug use: No  . Sexual activity: Not on file  Lifestyle  . Physical activity:    Days per week: Not on file    Minutes per session: Not on file  . Stress: Not on file  Relationships  . Social connections:    Talks on phone: Not  on file    Gets together: Not on file    Attends religious service: Not on file    Active member of club or organization: Not on file    Attends meetings of clubs or organizations: Not on file    Relationship status: Not on file  . Intimate partner violence:    Fear of current or ex partner: Not on file    Emotionally abused: Not on file    Physically abused: Not on file    Forced sexual activity: Not on file  Other Topics Concern  . Not on file  Social History Narrative  . Not on file   Family History  Problem Relation Age of Onset  . Diabetes Father   . Kidney Stones Father   . Hypertension Father   . Heart disease Maternal Grandmother   . COPD Maternal Grandmother   . Heart disease Maternal Grandfather   . Hypertension Maternal Grandfather   . Diabetes Paternal Grandmother   . Diabetes Paternal Grandfather   . Heart disease Paternal Grandfather     Objective: Office vital signs reviewed. BP 132/86   Pulse 99   Temp 97.8 F (36.6 C) (Oral)   Ht 6\' 5"  (1.956 m)   Wt (!) 340 lb (154.2 kg)  BMI 40.32 kg/m   Physical Examination:  General: Awake, alert, obese, No acute distress HEENT: +TTP to frontal and maxillary sinuses    Neck: No masses palpated. No lymphadenopathy    Ears: Tympanic membranes intact, normal light reflex, no erythema, no bulging    Eyes: PERRLA, extraocular membranes intact, sclera white    Nose: nasal turbinates moist, purulent nasal discharge    Throat: moist mucus membranes, no erythema, no tonsillar exudate.  Airway is patent Cardio: regular rate and rhythm, S1S2 heard, no murmurs appreciated Pulm: clear to auscultation bilaterally, no wheezes, rhonchi or rales; normal work of breathing on room air  Assessment/ Plan: [age] y.o. male   1. Acute bacterial sinusitis Patient is afebrile and nontoxic-appearing.  Pulse is slightly elevated.  Physical exam is consistent with an acute bacterial sinusitis.  Patient has penicillin allergy as a child  but no history of anaphylaxis.  Omnicef 300 mg p.o. twice daily for 10 days prescribed.  Home care instructions reviewed and handout was provided.  Work note provided.  Patient notes that he may need FMLA for missed days.  I encouraged him to bring paperwork by if he needs to have this filled out.  I agree that he is unable to wear a respirator if he is unable to breathe easily in the setting of infection.  Follow-up as needed.   Meds ordered this encounter  Medications  . cefdinir (OMNICEF) 300 MG capsule    Sig: Take 1 capsule (300 mg total) by mouth 2 (two) times daily. 1 po BID    Dispense:  20 capsule    Refill:  0     Thomas [name] Hulen SkainsM Dorrine Montone, DO [facility] [phone]

## 2018-09-02 ENCOUNTER — Ambulatory Visit: Payer: 59 | Admitting: Family

## 2018-09-02 ENCOUNTER — Encounter: Payer: Self-pay | Admitting: Family

## 2018-09-02 VITALS — BP 124/78 | HR 93 | Temp 97.0°F | Ht 77.0 in | Wt 340.2 lb

## 2018-09-02 DIAGNOSIS — R030 Elevated blood-pressure reading, without diagnosis of hypertension: Secondary | ICD-10-CM

## 2018-09-02 DIAGNOSIS — K219 Gastro-esophageal reflux disease without esophagitis: Secondary | ICD-10-CM

## 2018-09-02 MED ORDER — OMEPRAZOLE 20 MG PO CPDR: 20.0000 mg | DELAYED_RELEASE_CAPSULE | Freq: Every day | ORAL | 1 refills | Status: DC

## 2018-09-02 NOTE — Patient Instructions (Signed)
Managing Your Hypertension Hypertension is commonly called high blood pressure. This is when the force of your blood pressing against the walls of your arteries is too strong. Arteries are blood vessels that carry blood from your heart throughout your body. Hypertension forces the heart to work harder to pump blood, and may cause the arteries to become narrow or stiff. Having untreated or uncontrolled hypertension can cause heart attack, stroke, kidney disease, and other problems. What are blood pressure readings? A blood pressure reading consists of a higher number over a lower number. Ideally, your blood pressure should be below 120/80. The first ("top") number is called the systolic pressure. It is a measure of the pressure in your arteries as your heart beats. The second ("bottom") number is called the diastolic pressure. It is a measure of the pressure in your arteries as the heart relaxes. What does my blood pressure reading mean? Blood pressure is classified into four stages. Based on your blood pressure reading, your health care provider may use the following stages to determine what type of treatment you need, if any. Systolic pressure and diastolic pressure are measured in a unit called mm Hg. Normal  Systolic pressure: below 120.  Diastolic pressure: below 80. Elevated  Systolic pressure: 120-129.  Diastolic pressure: below 80. Hypertension stage 1  Systolic pressure: 130-139.  Diastolic pressure: 80-89. Hypertension stage 2  Systolic pressure: 140 or above.  Diastolic pressure: 90 or above. What health risks are associated with hypertension? Managing your hypertension is an important responsibility. Uncontrolled hypertension can lead to:  A heart attack.  A stroke.  A weakened blood vessel (aneurysm).  Heart failure.  Kidney damage.  Eye damage.  Metabolic syndrome.  Memory and concentration problems.  What changes can I make to manage my  hypertension? Hypertension can be managed by making lifestyle changes and possibly by taking medicines. Your health care provider will help you make a plan to bring your blood pressure within a normal range. Eating and drinking  Eat a diet that is high in fiber and potassium, and low in salt (sodium), added sugar, and fat. An example eating plan is called the DASH (Dietary Approaches to Stop Hypertension) diet. To eat this way: ? Eat plenty of fresh fruits and vegetables. Try to fill half of your plate at each meal with fruits and vegetables. ? Eat whole grains, such as whole wheat pasta, brown rice, or whole grain bread. Fill about one quarter of your plate with whole grains. ? Eat low-fat diary products. ? Avoid fatty cuts of meat, processed or cured meats, and poultry with skin. Fill about one quarter of your plate with lean proteins such as fish, chicken without skin, beans, eggs, and tofu. ? Avoid premade and processed foods. These tend to be higher in sodium, added sugar, and fat.  Reduce your daily sodium intake. Most people with hypertension should eat less than 1,500 mg of sodium a day.  Limit alcohol intake to no more than 1 drink a day for nonpregnant women and 2 drinks a day for men. One drink equals 12 oz of beer, 5 oz of wine, or 1 oz of hard liquor. Lifestyle  Work with your health care provider to maintain a healthy body weight, or to lose weight. Ask what an ideal weight is for you.  Get at least 30 minutes of exercise that causes your heart to beat faster (aerobic exercise) most days of the week. Activities may include walking, swimming, or biking.  Include exercise   to strengthen your muscles (resistance exercise), such as weight lifting, as part of your weekly exercise routine. Try to do these types of exercises for 30 minutes at least 3 days a week.  Do not use any products that contain nicotine or tobacco, such as cigarettes and e-cigarettes. If you need help quitting, ask  your health care provider.  Control any long-term (chronic) conditions you have, such as high cholesterol or diabetes. Monitoring  Monitor your blood pressure at home as told by your health care provider. Your personal target blood pressure may vary depending on your medical conditions, your age, and other factors.  Have your blood pressure checked regularly, as often as told by your health care provider. Working with your health care provider  Review all the medicines you take with your health care provider because there may be side effects or interactions.  Talk with your health care provider about your diet, exercise habits, and other lifestyle factors that may be contributing to hypertension.  Visit your health care provider regularly. Your health care provider can help you create and adjust your plan for managing hypertension. Will I need medicine to control my blood pressure? Your health care provider may prescribe medicine if lifestyle changes are not enough to get your blood pressure under control, and if:  Your systolic blood pressure is 130 or higher.  Your diastolic blood pressure is 80 or higher.  Take medicines only as told by your health care provider. Follow the directions carefully. Blood pressure medicines must be taken as prescribed. The medicine does not work as well when you skip doses. Skipping doses also puts you at risk for problems. Contact a health care provider if:  You think you are having a reaction to medicines you have taken.  You have repeated (recurrent) headaches.  You feel dizzy.  You have swelling in your ankles.  You have trouble with your vision. Get help right away if:  You develop a severe headache or confusion.  You have unusual weakness or numbness, or you feel faint.  You have severe pain in your chest or abdomen.  You vomit repeatedly.  You have trouble breathing. Summary  Hypertension is when the force of blood pumping through  your arteries is too strong. If this condition is not controlled, it may put you at risk for serious complications.  Your personal target blood pressure may vary depending on your medical conditions, your age, and other factors. For most people, a normal blood pressure is less than 120/80.  Hypertension is managed by lifestyle changes, medicines, or both. Lifestyle changes include weight loss, eating a healthy, low-sodium diet, exercising more, and limiting alcohol. This information is not intended to replace advice given to you by your health care provider. Make sure you discuss any questions you have with your health care provider. Document Released: 06/11/2012 Document Revised: 08/15/2016 Document Reviewed: 08/15/2016 Elsevier Interactive Patient Education  2018 Elsevier Inc.  

## 2018-09-02 NOTE — Progress Notes (Signed)
   Subjective:    Patient ID: Thomas PippinsKurt D Liao, male    DOB: Dec 24, 1980, 37 y.o.   MRN: [id number]  Chief Complaint  Patient presents with  . Hypertension   PT presents to the office today with elevated blood pressure readings at home. He reports his BP has been 160's/110.  Hypertension  This is a new problem. The current episode started 1 to 4 weeks ago. The problem has been waxing and waning since onset. The problem is uncontrolled. Pertinent negatives include no headaches, peripheral edema or shortness of breath. Risk factors for coronary artery disease include male gender, obesity and sedentary lifestyle. Past treatments include nothing. The current treatment provides no improvement. There is no history of kidney disease, CAD/MI, CVA or heart failure.  Gastroesophageal Reflux  He complains of belching, heartburn and nausea. This is a chronic problem. The current episode started more than 1 month ago. The problem occurs frequently. The problem has been gradually worsening. He has tried an antacid for the symptoms. The treatment provided mild relief.      Review of Systems  Respiratory: Negative for shortness of breath.   Gastrointestinal: Positive for heartburn and nausea.  Neurological: Negative for headaches.  All other systems reviewed and are negative.      Objective:   Physical Exam  Constitutional: He is oriented to person, place, and time. He appears well-developed and well-nourished. No distress.  HENT:  Head: Normocephalic.  Right Ear: External ear normal.  Left Ear: External ear normal.  Mouth/Throat: Oropharynx is clear and moist.  Eyes: Pupils are equal, round, and reactive to light. Right eye exhibits no discharge. Left eye exhibits no discharge.  Neck: Normal range of motion. Neck supple. No thyromegaly present.  Cardiovascular: Normal rate, regular rhythm, normal heart sounds and intact distal pulses.  No murmur heard. Pulmonary/Chest: Effort normal and breath  sounds normal. No respiratory distress. He has no wheezes.  Abdominal: Soft. Bowel sounds are normal. He exhibits no distension. There is no tenderness.  Musculoskeletal: Normal range of motion. He exhibits no edema or tenderness.  Neurological: He is alert and oriented to person, place, and time. He has normal reflexes. No cranial nerve deficit.  Skin: Skin is warm and dry. No rash noted. No erythema.  Psychiatric: He has a normal mood and affect. His behavior is normal. Judgment and thought content normal.  Vitals reviewed.   BP 124/78   Pulse 93   Temp (!) 97 F (36.1 C) (Oral)   Ht 6\' 5"  (1.956 m)   Wt (!) 340 lb (154.2 kg)   BMI 40.32 kg/m      Assessment & Plan:  Thomas PippinsKurt D Weinheimer comes in today with chief complaint of Hypertension   Diagnosis and orders addressed:  1. Elevated blood pressure reading Second reading good -Dash diet information given -Exercise encouraged - Stress Management  -Continue current meds   2. Gastroesophageal reflux disease, esophagitis presence not specified -Diet discussed- Avoid fried, spicy, citrus foods, caffeine and alcohol -Do not eat 2-3 hours before bedtime -Encouraged small frequent meals -Avoid NSAID's - omeprazole (PRILOSEC) 20 MG capsule; Take 1 capsule (20 mg total) by mouth daily.  Dispense: 90 capsule; Refill: 1    Follow up plan: Keep follow up follow up with PCP  Jannifer Rodneyhristy Malini Flemings, FNP

## 2018-10-07 ENCOUNTER — Encounter: Payer: 59 | Admitting: Family Medicine

## 2019-05-05 ENCOUNTER — Ambulatory Visit: Payer: 59 | Admitting: Family

## 2019-05-05 ENCOUNTER — Encounter: Payer: Self-pay | Admitting: Family

## 2019-05-05 ENCOUNTER — Other Ambulatory Visit: Payer: Self-pay

## 2019-05-05 VITALS — BP 128/81 | HR 101 | Temp 98.6°F | Ht 77.0 in | Wt 344.0 lb

## 2019-05-05 DIAGNOSIS — M7582 Other shoulder lesions, left shoulder: Secondary | ICD-10-CM

## 2019-05-05 DIAGNOSIS — M79602 Pain in left arm: Secondary | ICD-10-CM

## 2019-05-05 MED ORDER — PREDNISONE 10 MG (21) PO TBPK: ORAL_TABLET | ORAL | 0 refills | Status: DC

## 2019-05-05 MED ORDER — DICLOFENAC SODIUM 75 MG PO TBEC: 75.0000 mg | DELAYED_RELEASE_TABLET | Freq: Two times a day (BID) | ORAL | 0 refills | Status: DC

## 2019-05-05 NOTE — Progress Notes (Signed)
Subjective:    Patient ID: Thomas [name], male    DOB: 15-Jan-1981, 38 y.o.   MRN: [id number]  Chief Complaint  Patient presents with  . left arm and shoulder pain since yesterday    Shoulder Pain  The pain is present in the left arm and left shoulder. This is a new problem. The current episode started yesterday. 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 ). The problem occurs intermittently. The problem has been unchanged. The quality of the pain is described as burning. The pain is at a severity of 5/10. The pain is mild. Pertinent negatives include no joint locking, joint swelling, numbness or stiffness. The symptoms are aggravated by activity. He has tried rest and NSAIDS for the symptoms. The treatment provided mild relief.      Review of Systems  Musculoskeletal: Negative for stiffness.  Neurological: Negative for numbness.  All other systems reviewed and are negative.  Family History  Problem Relation Age of Onset  . Diabetes Father   . Kidney Stones Father   . Hypertension Father   . Heart disease Maternal Grandmother   . COPD Maternal Grandmother   . Heart disease Maternal Grandfather   . Hypertension Maternal Grandfather   . Diabetes Paternal Grandmother   . Diabetes Paternal Grandfather   . Heart disease Paternal Grandfather    Social History   Socioeconomic History  . Marital status: Married    Spouse name: Not on file  . Number of children: Not on file  . Years of education: Not on file  . Highest education level: Not on file  Occupational History  . Not on file  Social Needs  . Financial resource strain: Not on file  . Food insecurity    Worry: Not on file    Inability: Not on file  . Transportation needs    Medical: Not on file    Non-medical: Not on file  Tobacco Use  . Smoking status: Former Research scientist (life sciences)  . Smokeless tobacco: Never Used  Substance and Sexual  Activity  . Alcohol use: Yes    Comment: occasional  . Drug use: No  . Sexual activity: Not on file  Lifestyle  . Physical activity    Days per week: Not on file    Minutes per session: Not on file  . Stress: Not on file  Relationships  . Social Herbalist on phone: Not on file    Gets together: Not on file    Attends religious service: Not on file    Active member of club or organization: Not on file    Attends meetings of clubs or organizations: Not on file    Relationship status: Not on file  . Intimate partner violence    Fear of current or ex partner: Not on file    Emotionally abused: Not on file    Physically abused: Not on file    Forced sexual activity: Not on file  Other Topics Concern  . Not on file  Social History Narrative  . Not on file       Objective:   Physical Exam Vitals signs reviewed.  Constitutional:      General: He is not in acute distress.    Appearance: He is well-developed.  HENT:     Head: Normocephalic.  Eyes:     General:        Right  eye: No discharge.        Left eye: No discharge.     Pupils: Pupils are equal, round, and reactive to light.  Neck:     Musculoskeletal: Normal range of motion and neck supple.     Thyroid: No thyromegaly.  Cardiovascular:     Rate and Rhythm: Normal rate and regular rhythm.     Heart sounds: Normal heart sounds. No murmur.  Pulmonary:     Effort: Pulmonary effort is normal. No respiratory distress.     Breath sounds: Normal breath sounds. No wheezing.  Abdominal:     General: Bowel sounds are normal. There is no distension.     Palpations: Abdomen is soft.     Tenderness: There is no abdominal tenderness.  Musculoskeletal: Normal range of motion.        General: No tenderness.  Skin:    General: Skin is warm and dry.     Findings: No erythema or rash.  Neurological:     Mental Status: He is alert and oriented to person, place, and time.     Cranial Nerves: No cranial nerve deficit.      Deep Tendon Reflexes: Reflexes are normal and symmetric.  Psychiatric:        Behavior: Behavior normal.        Thought Content: Thought content normal.        Judgment: Judgment normal.     BP 128/81   Pulse (!) 101   Temp 98.6 F (37 C) (Temporal)   Ht 6\' 5"  (1.956 m)   Wt (!) 344 lb (156 kg)   BMI 40.79 kg/m      Assessment & Plan:  Ronald PippinsKurt D Hascall comes in today with chief complaint of left arm and shoulder pain since yesterday   Diagnosis and orders addressed:  1. Left arm pain - EKG 12-Lead  2. Tendinitis of left rotator cuff Rest Ice  ROM exercises encouraged  Work note given  EKG normal  RTO if symptoms worsen or do not improve  - diclofenac (VOLTAREN) 75 MG EC tablet; Take 1 tablet (75 mg total) by mouth 2 (two) times daily.  Dispense: 30 tablet; Refill: 0 - predniSONE (STERAPRED UNI-PAK 21 TAB) 10 MG (21) TBPK tablet; Use as directed  Dispense: 21 tablet; Refill: 0   Jannifer Rodneyhristy Sintia Mckissic, FNP

## 2019-05-05 NOTE — Patient Instructions (Signed)
Rotator Cuff Tendinitis  Rotator cuff tendinitis is inflammation of the tough, cord-like bands that connect muscle to bone (tendons) in the rotator cuff. The rotator cuff includes all of the muscles and tendons that connect the arm to the shoulder. The rotator cuff holds the head of the upper arm bone (humerus) in the cup (fossa) of the shoulder blade (scapula). This condition can lead to a long-lasting (chronic) tear. The tear may be partial or complete. What are the causes? This condition is usually caused by overusing the rotator cuff. What increases the risk? This condition is more likely to develop in athletes and workers who frequently use their shoulder or reach over their heads. This can include activities such as:  Tennis.  Baseball or softball.  Swimming.  Construction work.  Painting. What are the signs or symptoms? Symptoms of this condition include:  Pain spreading (radiating) from the shoulder to the upper arm.  Swelling and tenderness in front of the shoulder.  Pain when reaching, pulling, or lifting the arm above the head.  Pain when lowering the arm from above the head.  Minor pain in the shoulder when resting.  Increased pain in the shoulder at night.  Difficulty placing the arm behind the back. How is this diagnosed? This condition is diagnosed with a medical history and physical exam. Tests may also be done, including:  X-rays.  MRI.  Ultrasounds.  CT or MR arthrogram. During this test, a contrast material is injected and then images are taken. How is this treated? Treatment for this condition depends on the severity of the condition. In less severe cases, treatment may include:  Rest. This may be done with a sling that holds the shoulder still (immobilization). Your health care provider may also recommend avoiding activities that involve lifting your arm over your head.  Icing the shoulder.  Anti-inflammatory medicines, such as aspirin or  ibuprofen. In more severe cases, treatment may include:  Physical therapy.  Steroid injections.  Surgery. Follow these instructions at home: If you have a sling:  Wear the sling as told by your health care provider. Remove it only as told by your health care provider.  Loosen the sling if your fingers tingle, become numb, or turn cold and blue.  Keep the sling clean.  If the sling is not waterproof, do not let it get wet. Remove it, if allowed, or cover it with a watertight covering when you take a bath or shower. Managing pain, stiffness, and swelling  If directed, put ice on the injured area. ? If you have a removable sling, remove it as told by your health care provider. ? Put ice in a plastic bag. ? Place a towel between your skin and the bag. ? Leave the ice on for 20 minutes, 2-3 times a day.  Move your fingers often to avoid stiffness and to lessen swelling.  Raise (elevate) the injured area above the level of your heart while you are lying down.  Find a comfortable sleeping position or sleep on a recliner, if available. Driving  Do not drive or use heavy machinery while taking prescription pain medicine.  Ask your health care provider when it is safe to drive if you have a sling on your arm. Activity  Rest your shoulder as told by your health care provider.  Return to your normal activities as told by your health care provider. Ask your health care provider what activities are safe for you.  Do any exercises or stretches as   told by your health care provider.  If you do repetitive overhead tasks, take small breaks in between and include stretching exercises as told by your health care provider. General instructions  Do not use any products that contain nicotine or tobacco, such as cigarettes and e-cigarettes. These can delay healing. If you need help quitting, ask your health care provider.  Take over-the-counter and prescription medicines only as told by your  health care provider.  Keep all follow-up visits as told by your health care provider. This is important. Contact a health care provider if:  Your pain gets worse.  You have new pain in your arm, hands, or fingers.  Your pain is not relieved with medicine or does not get better after 6 weeks of treatment.  You have cracking sensations when moving your shoulder in certain directions.  You hear a snapping sound after using your shoulder, followed by severe pain and weakness. Get help right away if:  Your arm, hand, or fingers are numb or tingling.  Your arm, hand, or fingers are swollen or painful or they turn white or blue. Summary  Rotator cuff tendinitis is inflammation of the tough, cord-like bands that connect muscle to bone (tendons) in the rotator cuff.  This condition is usually caused by overusing the rotator cuff, which includes all of the muscles and tendons that connect the arm to the shoulder.  This condition is more likely to develop in athletes and workers who frequently use their shoulder or reach over their heads.  Treatment generally includes rest, anti-inflammatory medicines, and icing. In some cases, physical therapy and steroid injections may be needed. In severe cases, surgery may be needed. This information is not intended to replace advice given to you by your health care provider. Make sure you discuss any questions you have with your health care provider. Document Released: 12/08/2003 Document Revised: 01/09/2019 Document Reviewed: 09/03/2016 Elsevier Patient Education  2020 Elsevier Inc.  

## 2019-05-06 ENCOUNTER — Telehealth: Payer: Self-pay | Admitting: Family

## 2019-05-06 NOTE — Telephone Encounter (Signed)
Request sent to [facility], she will be in office 05/07/19

## 2019-05-06 NOTE — Telephone Encounter (Signed)
Letter sent to his MyChart.  

## 2019-05-07 NOTE — Telephone Encounter (Signed)
Patient aware.

## 2019-05-15 ENCOUNTER — Other Ambulatory Visit: Payer: Self-pay | Admitting: Family

## 2019-07-03 ENCOUNTER — Other Ambulatory Visit: Payer: Self-pay

## 2019-07-03 DIAGNOSIS — Z20822 Contact with and (suspected) exposure to covid-19: Secondary | ICD-10-CM

## 2019-07-04 LAB — NOVEL CORONAVIRUS, NAA: SARS-CoV-2, NAA: NOT DETECTED

## 2019-07-26 ENCOUNTER — Other Ambulatory Visit: Payer: Self-pay | Admitting: Family

## 2019-07-26 DIAGNOSIS — K219 Gastro-esophageal reflux disease without esophagitis: Secondary | ICD-10-CM

## 2019-09-17 ENCOUNTER — Encounter: Payer: 59 | Admitting: Family Medicine

## 2019-10-07 ENCOUNTER — Encounter: Payer: 59 | Admitting: Family Medicine

## 2019-10-14 ENCOUNTER — Ambulatory Visit (INDEPENDENT_AMBULATORY_CARE_PROVIDER_SITE_OTHER): Payer: BC Managed Care – PPO | Admitting: Family Medicine

## 2019-10-14 ENCOUNTER — Encounter: Payer: Self-pay | Admitting: Family Medicine

## 2019-10-14 ENCOUNTER — Other Ambulatory Visit: Payer: Self-pay

## 2019-10-14 VITALS — BP 147/87 | HR 99 | Temp 96.0°F | Ht 77.0 in | Wt 352.8 lb

## 2019-10-14 DIAGNOSIS — Z0000 Encounter for general adult medical examination without abnormal findings: Secondary | ICD-10-CM | POA: Diagnosis not present

## 2019-10-14 DIAGNOSIS — R7303 Prediabetes: Secondary | ICD-10-CM | POA: Diagnosis not present

## 2019-10-14 DIAGNOSIS — I10 Essential (primary) hypertension: Secondary | ICD-10-CM

## 2019-10-14 DIAGNOSIS — E1169 Type 2 diabetes mellitus with other specified complication: Secondary | ICD-10-CM

## 2019-10-14 DIAGNOSIS — R6889 Other general symptoms and signs: Secondary | ICD-10-CM | POA: Diagnosis not present

## 2019-10-14 LAB — BAYER DCA HB A1C WAIVED: HB A1C (BAYER DCA - WAIVED): 9.1 % — ABNORMAL HIGH (ref <7.0)

## 2019-10-14 MED ORDER — LISINOPRIL 20 MG PO TABS: 20.0000 mg | ORAL_TABLET | Freq: Every day | ORAL | 3 refills | Status: DC

## 2019-10-14 MED ORDER — METFORMIN HCL 500 MG PO TABS: 1000.0000 mg | ORAL_TABLET | Freq: Two times a day (BID) | ORAL | 3 refills | Status: DC

## 2019-10-14 NOTE — Progress Notes (Signed)
BP (!) 147/87   Pulse 99   Temp (!) 96 F (35.6 C) (Temporal)   Ht 6' 5"  (1.956 m)   Wt (!) 352 lb 12.8 oz (160 kg)   SpO2 95%   BMI 41.84 kg/m    Subjective:   Patient ID: Thomas [name], male    DOB: 04-Dec-1980, 39 y.o.   MRN: [id number]  HPI: Thomas [name] is a 39 y.o. male presenting on 10/14/2019 for Annual Exam   HPI Adult well exam and physical Patient has been having some tingling in his toes, last time he was seen a couple years ago he had prediabetes, there is some concern that he may be developing some neuropathy because that his blood sugar is up.  He is also having more issues with blood pressure and his wife assisted at home and is still running up as well.  His blood pressure today is 147/87.  He typically gets in the 140s 150s at home.  He has gained some weight this past year because his job is more sedentary although he does plan to get out and start exercising more.  His blood pressure was also up the last year that he was here as well.   Relevant past medical, surgical, family and social history reviewed and updated as indicated. Interim medical history since our last visit reviewed. Allergies and medications reviewed and updated.  Review of Systems  Constitutional: Negative for chills and fever.  HENT: Negative for ear pain and tinnitus.   Eyes: Negative for pain and visual disturbance.  Respiratory: Negative for cough, shortness of breath and wheezing.   Cardiovascular: Negative for chest pain, palpitations and leg swelling.  Gastrointestinal: Negative for abdominal pain, blood in stool, constipation and diarrhea.  Genitourinary: Negative for dysuria and hematuria.  Musculoskeletal: Negative for back pain, gait problem and myalgias.  Skin: Negative for rash.  Neurological: Positive for numbness. Negative for dizziness, weakness and headaches.  Psychiatric/Behavioral: Negative for suicidal ideas.  All other systems reviewed and are negative.   Per HPI  unless specifically indicated above   Allergies as of 10/14/2019      Reactions   Penicillins Hives, Rash      Medication List       Accurate as of October 14, 2019  2:52 PM. If you have any questions, ask your nurse or doctor.        STOP taking these medications   predniSONE 10 MG (21) Tbpk tablet Commonly known as: STERAPRED UNI-PAK 21 TAB Stopped by: Fransisca Kaufmann Dayannara Pascal, MD     TAKE these medications   diclofenac 75 MG EC tablet Commonly known as: VOLTAREN Take 1 tablet (75 mg total) by mouth 2 (two) times daily.   lisinopril 20 MG tablet Commonly known as: ZESTRIL Take 1 tablet (20 mg total) by mouth daily. Started by: Worthy Rancher, MD   metFORMIN 500 MG tablet Commonly known as: GLUCOPHAGE Take 2 tablets (1,000 mg total) by mouth 2 (two) times daily with a meal. Started by: Fransisca Kaufmann Arrin Pintor, MD   omeprazole 20 MG capsule Commonly known as: PRILOSEC TAKE 1 CAPSULE BY MOUTH EVERY DAY        Objective:   BP (!) 147/87   Pulse 99   Temp (!) 96 F (35.6 C) (Temporal)   Ht 6' 5"  (1.956 m)   Wt (!) 352 lb 12.8 oz (160 kg)   SpO2 95%   BMI 41.84 kg/m   Wt Readings from Last 3  Encounters:  10/14/19 (!) 352 lb 12.8 oz (160 kg)  05/05/19 (!) 344 lb (156 kg)  09/02/18 (!) 340 lb 3.2 oz (154.3 kg)    Physical Exam Vitals and nursing note reviewed.  Constitutional:      General: He is not in acute distress.    Appearance: He is well-developed. He is not diaphoretic.  Eyes:     General: No scleral icterus.    Conjunctiva/sclera: Conjunctivae normal.  Neck:     Thyroid: No thyromegaly.  Cardiovascular:     Rate and Rhythm: Normal rate and regular rhythm.     Heart sounds: Normal heart sounds. No murmur.  Pulmonary:     Effort: Pulmonary effort is normal. No respiratory distress.     Breath sounds: Normal breath sounds. No wheezing.  Abdominal:     General: Abdomen is flat. Bowel sounds are normal. There is no distension.     Tenderness: There  is no abdominal tenderness. There is no right CVA tenderness, left CVA tenderness, guarding or rebound.  Musculoskeletal:        General: Normal range of motion.     Cervical back: Neck supple.  Lymphadenopathy:     Cervical: No cervical adenopathy.  Skin:    General: Skin is warm and dry.     Findings: No rash.  Neurological:     Mental Status: He is alert and oriented to person, place, and time.     Coordination: Coordination normal.  Psychiatric:        Behavior: Behavior normal.       Assessment & Plan:   Problem List Items Addressed This Visit      Endocrine   Type 2 diabetes mellitus with other specified complication (HCC)   Relevant Medications   metFORMIN (GLUCOPHAGE) 500 MG tablet   lisinopril (ZESTRIL) 20 MG tablet   Other Relevant Orders   hgba1c   Vitamin B12   BMP8+EGFR   Referral to Nutrition and Diabetes Services    Other Visit Diagnoses    Well adult exam    -  Primary   Relevant Orders   CBC with Differential/Platelet   CMP14+EGFR   Lipid panel   Essential hypertension       Relevant Medications   lisinopril (ZESTRIL) 20 MG tablet   Other Relevant Orders   BMP8+EGFR      Patient feels like he is getting tingling and burning, will check A1c.  We will start a medication for his blood pressure.  A1c came back at 9.1, likely the cause of his neuropathy is elevated blood sugars, will send to diabetic educator and gave education here in office and will start on Metformin ramping up.  For blood pressure will start on lisinopril and recheck BMP in 2 to 3 weeks. Follow up plan: Return in about 3 months (around 01/12/2020), or if symptoms worsen or fail to improve, for Diabetes recheck.  Counseling provided for all of the vaccine components Orders Placed This Encounter  Procedures  . hgba1c  . CBC with Differential/Platelet  . CMP14+EGFR  . Lipid panel  . Vitamin B12  . BMP8+EGFR  . Referral to Nutrition and Diabetes [place]  Emme Rosenau, MD [facility] 10/14/2019, 2:52 PM

## 2019-10-14 NOTE — Patient Instructions (Signed)
Diabetes Mellitus and Nutrition, Adult When you have diabetes (diabetes mellitus), it is very important to have healthy eating habits because your blood sugar (glucose) levels are greatly affected by what you eat and drink. Eating healthy foods in the appropriate amounts, at about the same times every day, can help you:  Control your blood glucose.  Lower your risk of heart disease.  Improve your blood pressure.  Reach or maintain a healthy weight. Every person with diabetes is different, and each person has different needs for a meal plan. Your health care provider may recommend that you work with a diet and nutrition specialist (dietitian) to make a meal plan that is best for you. Your meal plan may vary depending on factors such as:  The calories you need.  The medicines you take.  Your weight.  Your blood glucose, blood pressure, and cholesterol levels.  Your activity level.  Other health conditions you have, such as heart or kidney disease. How do carbohydrates affect me? Carbohydrates, also called carbs, affect your blood glucose level more than any other type of food. Eating carbs naturally raises the amount of glucose in your blood. Carb counting is a method for keeping track of how many carbs you eat. Counting carbs is important to keep your blood glucose at a healthy level, especially if you use insulin or take certain oral diabetes medicines. It is important to know how many carbs you can safely have in each meal. This is different for every person. Your dietitian can help you calculate how many carbs you should have at each meal and for each snack. Foods that contain carbs include:  Bread, cereal, rice, pasta, and crackers.  Potatoes and corn.  Peas, beans, and lentils.  Milk and yogurt.  Fruit and juice.  Desserts, such as cakes, cookies, ice cream, and candy. How does alcohol affect me? Alcohol can cause a sudden decrease in blood glucose (hypoglycemia),  especially if you use insulin or take certain oral diabetes medicines. Hypoglycemia can be a life-threatening condition. Symptoms of hypoglycemia (sleepiness, dizziness, and confusion) are similar to symptoms of having too much alcohol. If your health care provider says that alcohol is safe for you, follow these guidelines:  Limit alcohol intake to no more than 1 drink per day for nonpregnant women and 2 drinks per day for men. One drink equals 12 oz of beer, 5 oz of wine, or 1 oz of hard liquor.  Do not drink on an empty stomach.  Keep yourself hydrated with water, diet soda, or unsweetened iced tea.  Keep in mind that regular soda, juice, and other mixers may contain a lot of sugar and must be counted as carbs. What are tips for following this plan?  Reading food labels  Start by checking the serving size on the "Nutrition Facts" label of packaged foods and drinks. The amount of calories, carbs, fats, and other nutrients listed on the label is based on one serving of the item. Many items contain more than one serving per package.  Check the total grams (g) of carbs in one serving. You can calculate the number of servings of carbs in one serving by dividing the total carbs by 15. For example, if a food has 30 g of total carbs, it would be equal to 2 servings of carbs.  Check the number of grams (g) of saturated and trans fats in one serving. Choose foods that have low or no amount of these fats.  Check the number of   milligrams (mg) of salt (sodium) in one serving. Most people should limit total sodium intake to less than 2,300 mg per day.  Always check the nutrition information of foods labeled as "low-fat" or "nonfat". These foods may be higher in added sugar or refined carbs and should be avoided.  Talk to your dietitian to identify your daily goals for nutrients listed on the label. Shopping  Avoid buying canned, premade, or processed foods. These foods tend to be high in fat, sodium,  and added sugar.  Shop around the outside edge of the grocery store. This includes fresh fruits and vegetables, bulk grains, fresh meats, and fresh dairy. Cooking  Use low-heat cooking methods, such as baking, instead of high-heat cooking methods like deep frying.  Cook using healthy oils, such as olive, canola, or sunflower oil.  Avoid cooking with butter, cream, or high-fat meats. Meal planning  Eat meals and snacks regularly, preferably at the same times every day. Avoid going long periods of time without eating.  Eat foods high in fiber, such as fresh fruits, vegetables, beans, and whole grains. Talk to your dietitian about how many servings of carbs you can eat at each meal.  Eat 4-6 ounces (oz) of lean protein each day, such as lean meat, chicken, fish, eggs, or tofu. One oz of lean protein is equal to: ? 1 oz of meat, chicken, or fish. ? 1 egg. ?  cup of tofu.  Eat some foods each day that contain healthy fats, such as avocado, nuts, seeds, and fish. Lifestyle  Check your blood glucose regularly.  Exercise regularly as told by your health care provider. This may include: ? 150 minutes of moderate-intensity or vigorous-intensity exercise each week. This could be brisk walking, biking, or water aerobics. ? Stretching and doing strength exercises, such as yoga or weightlifting, at least 2 times a week.  Take medicines as told by your health care provider.  Do not use any products that contain nicotine or tobacco, such as cigarettes and e-cigarettes. If you need help quitting, ask your health care provider.  Work with a Veterinary surgeon or diabetes educator to identify strategies to manage stress and any emotional and social challenges. Questions to ask a health care provider  Do I need to meet with a diabetes educator?  Do I need to meet with a dietitian?  What number can I call if I have questions?  When are the best times to check my blood glucose? Where to find more  information:  American Diabetes Association: diabetes.org  Academy of Nutrition and Dietetics: www.eatright.[employer] of Diabetes and Digestive and Kidney Diseases (NIH): CarFlippers.tn Summary  A healthy meal plan will help you control your blood glucose and maintain a healthy lifestyle.  Working with a diet and nutrition specialist (dietitian) can help you make a meal plan that is best for you.  Keep in mind that carbohydrates (carbs) and alcohol have immediate effects on your blood glucose levels. It is important to count carbs and to use alcohol carefully. This information is not intended to replace advice given to you by your health care provider. Make sure you discuss any questions you have with your health care provider. Document Revised: 08/30/2017 Document Reviewed: 10/22/2016 Elsevier Patient Education  2020 Elsevier Inc.   Diabetes Mellitus and Exercise Exercising regularly is important for your overall health, especially when you have diabetes (diabetes mellitus). Exercising is not only about losing weight. It has many other health benefits, such as increasing muscle strength  density and reducing body fat and stress. This leads to improved fitness, flexibility, and endurance, all of which result in better overall health. Exercise has additional benefits for people with diabetes, including:  Reducing appetite.  Helping to lower and control blood glucose.  Lowering blood pressure.  Helping to control amounts of fatty substances (lipids) in the blood, such as cholesterol and triglycerides.  Helping the body to respond better to insulin (improving insulin sensitivity).  Reducing how much insulin the body needs.  Decreasing the risk for heart disease by: ? Lowering cholesterol and triglyceride levels. ? Increasing the levels of good cholesterol. ? Lowering blood glucose levels. What is my activity plan? Your health care provider or certified  diabetes educator can help you make a plan for the type and frequency of exercise (activity plan) that works for you. Make sure that you:  Do at least 150 minutes of moderate-intensity or vigorous-intensity exercise each week. This could be brisk walking, biking, or water aerobics. ? Do stretching and strength exercises, such as yoga or weightlifting, at least 2 times a week. ? Spread out your activity over at least 3 days of the week.  Get some form of physical activity every day. ? Do not go more than 2 days in a row without some kind of physical activity. ? Avoid being inactive for more than 30 minutes at a time. Take frequent breaks to walk or stretch.  Choose a type of exercise or activity that you enjoy, and set realistic goals.  Start slowly, and gradually increase the intensity of your exercise over time. What do I need to know about managing my diabetes?   Check your blood glucose before and after exercising. ? If your blood glucose is 240 mg/dL (13.3 mmol/L) or higher before you exercise, check your urine for ketones. If you have ketones in your urine, do not exercise until your blood glucose returns to normal. ? If your blood glucose is 100 mg/dL (5.6 mmol/L) or lower, eat a snack containing 15-20 grams of carbohydrate. Check your blood glucose 15 minutes after the snack to make sure that your level is above 100 mg/dL (5.6 mmol/L) before you start your exercise.  Know the symptoms of low blood glucose (hypoglycemia) and how to treat it. Your risk for hypoglycemia increases during and after exercise. Common symptoms of hypoglycemia can include: ? Hunger. ? Anxiety. ? Sweating and feeling clammy. ? Confusion. ? Dizziness or feeling light-headed. ? Increased heart rate or palpitations. ? Blurry vision. ? Tingling or numbness around the mouth, lips, or tongue. ? Tremors or shakes. ? Irritability.  Keep a rapid-acting carbohydrate snack available before, during, and after  exercise to help prevent or treat hypoglycemia.  Avoid injecting insulin into areas of the body that are going to be exercised. For example, avoid injecting insulin into: ? The arms, when playing tennis. ? The legs, when jogging.  Keep records of your exercise habits. Doing this can help you and your health care provider adjust your diabetes management plan as needed. Write down: ? Food that you eat before and after you exercise. ? Blood glucose levels before and after you exercise. ? The type and amount of exercise you have done. ? When your insulin is expected to peak, if you use insulin. Avoid exercising at times when your insulin is peaking.  When you start a new exercise or activity, work with your health care provider to make sure the activity is safe for you, and to adjust your   adjust your insulin, medicines, or food intake as needed.  Drink plenty of water while you exercise to prevent dehydration or heat stroke. Drink enough fluid to keep your urine clear or pale yellow. Summary  Exercising regularly is important for your overall health, especially when you have diabetes (diabetes mellitus).  Exercising has many health benefits, such as increasing muscle strength and bone density and reducing body fat and stress.  Your health care provider or certified diabetes educator can help you make a plan for the type and frequency of exercise (activity plan) that works for you.  When you start a new exercise or activity, work with your health care provider to make sure the activity is safe for you, and to adjust your insulin, medicines, or food intake as needed. This information is not intended to replace advice given to you by your health care provider. Make sure you discuss any questions you have with your health care provider. Document Revised: 04/11/2017 Document Reviewed: 02/27/2016 Elsevier Patient Education  [place].

## 2019-10-15 LAB — LIPID PANEL
Chol/HDL Ratio: 9.5 ratio — ABNORMAL HIGH (ref 0.0–5.0)
Cholesterol, Total: 219 mg/dL — ABNORMAL HIGH (ref 100–199)
HDL: 23 mg/dL — ABNORMAL LOW (ref >39)
LDL Chol Calc (NIH): 80 mg/dL (ref 0–99)
Triglycerides: 722 mg/dL (ref 0–149)
VLDL Cholesterol Cal: 116 mg/dL — ABNORMAL HIGH (ref 5–40)

## 2019-10-15 LAB — CBC WITH DIFFERENTIAL/PLATELET
Basophils Absolute: 0 10*3/uL (ref 0.0–0.2)
Basos: 0 %
EOS (ABSOLUTE): 0.2 10*3/uL (ref 0.0–0.4)
Eos: 2 %
Hematocrit: 43 % (ref 37.5–51.0)
Hemoglobin: 14.3 g/dL (ref 13.0–17.7)
Immature Grans (Abs): 0 10*3/uL (ref 0.0–0.1)
Immature Granulocytes: 0 %
Lymphocytes Absolute: 2.4 10*3/uL (ref 0.7–3.1)
Lymphs: 32 %
MCH: 26.6 pg (ref 26.6–33.0)
MCHC: 33.3 g/dL (ref 31.5–35.7)
MCV: 80 fL (ref 79–97)
Monocytes Absolute: 0.7 10*3/uL (ref 0.1–0.9)
Monocytes: 9 %
Neutrophils Absolute: 4.4 10*3/uL (ref 1.4–7.0)
Neutrophils: 57 %
Platelets: 266 10*3/uL (ref 150–450)
RBC: 5.37 x10E6/uL (ref 4.14–5.80)
RDW: 14.4 % (ref 11.6–15.4)
WBC: 7.7 10*3/uL (ref 3.4–10.8)

## 2019-10-15 LAB — CMP14+EGFR
ALT: 30 IU/L (ref 0–44)
AST: 16 IU/L (ref 0–40)
Albumin/Globulin Ratio: 1.6 (ref 1.2–2.2)
Albumin: 3.9 g/dL — ABNORMAL LOW (ref 4.0–5.0)
Alkaline Phosphatase: 97 IU/L (ref 39–117)
BUN/Creatinine Ratio: 17 (ref 9–20)
BUN: 19 mg/dL (ref 6–20)
Bilirubin Total: 0.4 mg/dL (ref 0.0–1.2)
CO2: 26 mmol/L (ref 20–29)
Calcium: 9.5 mg/dL (ref 8.7–10.2)
Chloride: 99 mmol/L (ref 96–106)
Creatinine, Ser: 1.09 mg/dL (ref 0.76–1.27)
GFR calc Af Amer: 100 mL/min/{1.73_m2} (ref >59)
GFR calc non Af Amer: 87 mL/min/{1.73_m2} (ref >59)
Globulin, Total: 2.4 g/dL (ref 1.5–4.5)
Glucose: 337 mg/dL — ABNORMAL HIGH (ref 65–99)
Potassium: 4.5 mmol/L (ref 3.5–5.2)
Sodium: 136 mmol/L (ref 134–144)
Total Protein: 6.3 g/dL (ref 6.0–8.5)

## 2019-10-15 LAB — VITAMIN B12: Vitamin B-12: 524 pg/mL (ref 232–1245)

## 2019-10-16 ENCOUNTER — Telehealth: Payer: Self-pay | Admitting: Family Medicine

## 2019-10-16 NOTE — Progress Notes (Signed)
LMTCB

## 2019-10-16 NOTE — Telephone Encounter (Signed)
Pt aware of results 

## 2019-10-16 NOTE — Telephone Encounter (Signed)
Pt returning missed call from nurse regarding lab results.

## 2019-11-06 ENCOUNTER — Other Ambulatory Visit: Payer: BC Managed Care – PPO

## 2019-11-06 ENCOUNTER — Other Ambulatory Visit: Payer: Self-pay

## 2019-11-06 DIAGNOSIS — I10 Essential (primary) hypertension: Secondary | ICD-10-CM

## 2019-11-06 DIAGNOSIS — E1169 Type 2 diabetes mellitus with other specified complication: Secondary | ICD-10-CM

## 2019-11-07 LAB — BMP8+EGFR
BUN/Creatinine Ratio: 18 (ref 9–20)
BUN: 20 mg/dL (ref 6–20)
CO2: 22 mmol/L (ref 20–29)
Calcium: 9.8 mg/dL (ref 8.7–10.2)
Chloride: 103 mmol/L (ref 96–106)
Creatinine, Ser: 1.1 mg/dL (ref 0.76–1.27)
GFR calc Af Amer: 99 mL/min/{1.73_m2} (ref >59)
GFR calc non Af Amer: 85 mL/min/{1.73_m2} (ref >59)
Glucose: 147 mg/dL — ABNORMAL HIGH (ref 65–99)
Potassium: 4.4 mmol/L (ref 3.5–5.2)
Sodium: 142 mmol/L (ref 134–144)

## 2019-12-22 ENCOUNTER — Other Ambulatory Visit: Payer: Self-pay

## 2019-12-22 DIAGNOSIS — E1169 Type 2 diabetes mellitus with other specified complication: Secondary | ICD-10-CM

## 2020-01-13 ENCOUNTER — Telehealth: Payer: BC Managed Care – PPO | Admitting: Family Medicine

## 2020-01-20 ENCOUNTER — Encounter (HOSPITAL_COMMUNITY): Payer: Self-pay

## 2020-01-20 ENCOUNTER — Emergency Department (HOSPITAL_COMMUNITY): Payer: BC Managed Care – PPO

## 2020-01-20 ENCOUNTER — Other Ambulatory Visit: Payer: Self-pay

## 2020-01-20 ENCOUNTER — Telehealth: Payer: BC Managed Care – PPO | Admitting: Family Medicine

## 2020-01-20 ENCOUNTER — Emergency Department (HOSPITAL_COMMUNITY)
Admission: EM | Admit: 2020-01-20 | Discharge: 2020-01-21 | Disposition: A | Discharge status: Home / Self Care | Payer: BC Managed Care – PPO | Attending: Emergency Medicine | Admitting: Emergency Medicine

## 2020-01-20 DIAGNOSIS — R197 Diarrhea, unspecified: Secondary | ICD-10-CM | POA: Insufficient documentation

## 2020-01-20 DIAGNOSIS — Z7984 Long term (current) use of oral hypoglycemic drugs: Secondary | ICD-10-CM | POA: Diagnosis not present

## 2020-01-20 DIAGNOSIS — R112 Nausea with vomiting, unspecified: Secondary | ICD-10-CM | POA: Diagnosis not present

## 2020-01-20 DIAGNOSIS — Z87891 Personal history of nicotine dependence: Secondary | ICD-10-CM | POA: Insufficient documentation

## 2020-01-20 DIAGNOSIS — Z79899 Other long term (current) drug therapy: Secondary | ICD-10-CM | POA: Diagnosis not present

## 2020-01-20 DIAGNOSIS — E119 Type 2 diabetes mellitus without complications: Secondary | ICD-10-CM | POA: Diagnosis not present

## 2020-01-20 DIAGNOSIS — R109 Unspecified abdominal pain: Secondary | ICD-10-CM | POA: Insufficient documentation

## 2020-01-20 DIAGNOSIS — R1110 Vomiting, unspecified: Secondary | ICD-10-CM | POA: Diagnosis not present

## 2020-01-20 LAB — CBC WITH DIFFERENTIAL/PLATELET
Abs Immature Granulocytes: 0.05 10*3/uL (ref 0.00–0.07)
Basophils Absolute: 0.1 10*3/uL (ref 0.0–0.1)
Basophils Relative: 0 %
Eosinophils Absolute: 0.2 10*3/uL (ref 0.0–0.5)
Eosinophils Relative: 1 %
HCT: 50.9 % (ref 39.0–52.0)
Hemoglobin: 16.8 g/dL (ref 13.0–17.0)
Immature Granulocytes: 0 %
Lymphocytes Relative: 8 %
Lymphs Abs: 1.5 10*3/uL (ref 0.7–4.0)
MCH: 26.7 pg (ref 26.0–34.0)
MCHC: 33 g/dL (ref 30.0–36.0)
MCV: 80.9 fL (ref 80.0–100.0)
Monocytes Absolute: 1.4 10*3/uL — ABNORMAL HIGH (ref 0.1–1.0)
Monocytes Relative: 7 %
Neutro Abs: 16 10*3/uL — ABNORMAL HIGH (ref 1.7–7.7)
Neutrophils Relative %: 84 %
Platelets: 414 10*3/uL — ABNORMAL HIGH (ref 150–400)
RBC: 6.29 MIL/uL — ABNORMAL HIGH (ref 4.22–5.81)
RDW: 15.1 % (ref 11.5–15.5)
WBC: 19.2 10*3/uL — ABNORMAL HIGH (ref 4.0–10.5)
nRBC: 0 % (ref 0.0–0.2)

## 2020-01-20 LAB — COMPREHENSIVE METABOLIC PANEL
ALT: 33 U/L (ref 0–44)
AST: 34 U/L (ref 15–41)
Albumin: 4.4 g/dL (ref 3.5–5.0)
Alkaline Phosphatase: 81 U/L (ref 38–126)
Anion gap: 12 (ref 5–15)
BUN: 29 mg/dL — ABNORMAL HIGH (ref 6–20)
CO2: 19 mmol/L — ABNORMAL LOW (ref 22–32)
Calcium: 9.2 mg/dL (ref 8.9–10.3)
Chloride: 106 mmol/L (ref 98–111)
Creatinine, Ser: 1.32 mg/dL — ABNORMAL HIGH (ref 0.61–1.24)
GFR calc Af Amer: >60 mL/min (ref >60)
GFR calc non Af Amer: >60 mL/min (ref >60)
Glucose, Bld: 221 mg/dL — ABNORMAL HIGH (ref 70–99)
Potassium: 4 mmol/L (ref 3.5–5.1)
Sodium: 137 mmol/L (ref 135–145)
Total Bilirubin: 1 mg/dL (ref 0.3–1.2)
Total Protein: 7.9 g/dL (ref 6.5–8.1)

## 2020-01-20 LAB — LIPASE, BLOOD: Lipase: 23 U/L (ref 11–51)

## 2020-01-20 MED ORDER — IOHEXOL 300 MG/ML  SOLN
100.0000 mL | Freq: Once | INTRAMUSCULAR | Status: AC | PRN
  Administered 2020-01-20: 22:00:00 100 mL via INTRAVENOUS

## 2020-01-20 MED ORDER — ONDANSETRON HCL 4 MG/2ML IJ SOLN
4.0000 mg | Freq: Once | INTRAMUSCULAR | Status: AC
  Administered 2020-01-20: 21:00:00 4 mg via INTRAVENOUS
  Filled 2020-01-20: qty 2

## 2020-01-20 MED ORDER — ONDANSETRON 4 MG PO TBDP: 4.0000 mg | ORAL_TABLET | Freq: Three times a day (TID) | ORAL | 0 refills | Status: DC | PRN

## 2020-01-20 MED ORDER — SODIUM CHLORIDE 0.9 % IV BOLUS
1000.0000 mL | Freq: Once | INTRAVENOUS | Status: AC
  Administered 2020-01-20: 1000 mL via INTRAVENOUS

## 2020-01-20 NOTE — ED Provider Notes (Signed)
[facility] EMERGENCY DEPARTMENT Provider Note   CSN: [id number] Arrival date & time: 01/20/20  2001     History Chief Complaint  Patient presents with  . Emesis    Thomas [name] is a 39 y.o. male with pertinent past medical history of type 2 diabetes that presents to the emergency department for nausea and vomiting.  He denies any abdominal pain to me.  He states that he ate leftover Chinese food around lunchtime today and then 2 hours later started feeling nauseous.  He denies any abdominal pain, back pain, chest pain, shortness of breath, dizziness, near syncope, chills, fever, cough, sick contacts, past surgical abdominal history.  He states that he had about 10 episodes of nonbloody diarrhea and multiple episodes of nonbloody nonbilious vomiting this afternoon.  He states that he had red Gatorade after his ninth vomit and he had red-tinged stool and vomit.  He denies any dysuria.  He states that he was in normal health before this.  He has not taken anything for this and has not been able to tolerate Gatorade.  He states that his mouth is dry and he feels dehydrated.  He states that he has had food poisoning twice before in his life and it feels similar.  Patient denies any alcohol, drugs, recent marijuana use.  He has no new medication changes.    HPI      Past Medical History:  Diagnosis Date  . Plantar fasciitis     Patient Active Problem List   Diagnosis Date Noted  . Type 2 diabetes mellitus with other specified complication (HCC) 01/24/2017  . PLEVA (pityriasis lichenoides et varioliformis acuta) 12/28/2015    History reviewed. No pertinent surgical history.     Family History  Problem Relation Age of Onset  . Diabetes Father   . Kidney Stones Father   . Hypertension Father   . Heart disease Maternal Grandmother   . COPD Maternal Grandmother   . Heart disease Maternal Grandfather   . Hypertension Maternal Grandfather   . Diabetes Paternal Grandmother   .  Diabetes Paternal Grandfather   . Heart disease Paternal Grandfather     Social History   Tobacco Use  . Smoking status: Former Games developer  . Smokeless tobacco: Never Used  Substance Use Topics  . Alcohol use: Yes    Comment: occasional  . Drug use: No    Home Medications Prior to Admission medications   Medication Sig Start Date End Date Taking? Authorizing Provider  diclofenac (VOLTAREN) 75 MG EC tablet Take 1 tablet (75 mg total) by mouth 2 (two) times daily. 05/05/19   Jannifer Rodney A, FNP  lisinopril (ZESTRIL) 20 MG tablet Take 1 tablet (20 mg total) by mouth daily. 10/14/19   Dettinger, Elige Radon, MD  metFORMIN (GLUCOPHAGE) 500 MG tablet Take 2 tablets (1,000 mg total) by mouth 2 (two) times daily with a meal. 10/14/19   Dettinger, Elige Radon, MD  omeprazole (PRILOSEC) 20 MG capsule TAKE 1 CAPSULE BY MOUTH EVERY DAY 07/27/19   Dettinger, Elige Radon, MD    Allergies    Penicillins  Review of Systems   Review of Systems  Gastrointestinal: Positive for diarrhea, nausea and vomiting.  .Ten systems are reviewed by me and are negative for acute changes, except as noted in the HPI.   Physical Exam Updated Vital Signs BP 122/81 (BP Location: Left Arm)   Pulse (!) 116   Temp 97.7 F (36.5 C) (Oral)   Resp 17  Ht 6\' 5"  (1.956 m)   Wt (!) 149.7 kg   SpO2 100%   BMI 39.13 kg/m   Physical Exam .PE: Constitutional: well-developed, well-nourished, looks uncomfortable, has just vomited. HENT: normocephalic, atraumatic, lips and mouth do look dry Cardiovascular: Tachycardic and rhythm, distal pulses intact Pulmonary/Chest: effort normal; breath sounds clear and equal bilaterally; no wheezes or rales Abdominal: Abdomen soft and nondistended.  Hypoactive bowel sounds, no tenderness in all quadrants and epigastrium, no rebound, no guarding, no Murphy sign, no McBurney's point tenderness. Musculoskeletal: full ROM, no edema, no cervical, thoracic, lumbar point tenderness. Lymphadenopathy:  no cervical adenopathy Neurological: alert with goal directed thinking Skin: warm and dry, no rash, no diaphoresis Psychiatric: normal mood and affect, normal behavior   ED Results / Procedures / Treatments   Labs (all labs ordered are listed, but only abnormal results are displayed) Labs Reviewed  CBC WITH DIFFERENTIAL/PLATELET - Abnormal; Notable for the following components:      Result Value   WBC 19.2 (*)    RBC 6.29 (*)    Platelets 414 (*)    Neutro Abs 16.0 (*)    Monocytes Absolute 1.4 (*)    All other components within normal limits  COMPREHENSIVE METABOLIC PANEL - Abnormal; Notable for the following components:   CO2 19 (*)    Glucose, Bld 221 (*)    BUN 29 (*)    Creatinine, Ser 1.32 (*)    All other components within normal limits  LIPASE, BLOOD  URINALYSIS, ROUTINE W REFLEX MICROSCOPIC    EKG None  Radiology CT Abdomen Pelvis W Contrast  Result Date: 01/20/2020 CLINICAL DATA:  39 year old male with generalized abdominal pain and vomiting. EXAM: CT ABDOMEN AND PELVIS WITH CONTRAST TECHNIQUE: Multidetector CT imaging of the abdomen and pelvis was performed using the standard protocol following bolus administration of intravenous contrast. CONTRAST:  124mL OMNIPAQUE IOHEXOL 300 MG/ML  SOLN COMPARISON:  CT abdomen pelvis dated 02/24/2016. FINDINGS: Lower chest: With the visualized lung bases are clear. No intra-abdominal free air or free fluid. Hepatobiliary: There is mild fatty infiltration of the liver. No intrahepatic biliary ductal dilatation. The gallbladder is unremarkable. Pancreas: Unremarkable. No pancreatic ductal dilatation or surrounding inflammatory changes. Spleen: Normal in size without focal abnormality. Adrenals/Urinary Tract: The adrenal glands are unremarkable. There is a 2 mm nonobstructing left renal interpolar stone. A 3 mm nonobstructing stone is also noted in the inferior pole of the right kidney. There is no hydronephrosis on either side. The  visualized ureters and urinary bladder appear unremarkable. Stomach/Bowel: There is loose stool throughout the colon compatible with diarrheal state. Multiple normal caliber fluid-filled loops of small bowel may be physiologic or represent mild enteritis. There is no bowel obstruction. The appendix is normal. Vascular/Lymphatic: The abdominal aorta and IVC are unremarkable. No portal venous gas. There is no adenopathy. Reproductive: The prostate and seminal vesicles are grossly unremarkable. No pelvic mass. Other: None Musculoskeletal: No acute or significant osseous findings. IMPRESSION: 1. Diarrheal state with possible mild enteritis. Correlation with clinical exam and stool cultures recommended. No bowel obstruction. Normal appendix. 2. Small nonobstructing bilateral renal calculi.  No hydronephrosis. 3. Mild fatty liver. Electronically Signed   By: Anner Crete M.D.   On: 01/20/2020 22:08    Procedures Procedures (including critical care time)  Medications Ordered in ED Medications  sodium chloride 0.9 % bolus 1,000 mL (has no administration in time range)  ondansetron (ZOFRAN) injection 4 mg (4 mg Intravenous Given 01/20/20 2047)  sodium chloride 0.9 %  bolus 1,000 mL (1,000 mLs Intravenous New Bag/Given 01/20/20 2055)  iohexol (OMNIPAQUE) 300 MG/ML solution 100 mL (100 mLs Intravenous Contrast Given 01/20/20 2144)    ED Course  I have reviewed the triage vital signs and the nursing notes.  Pertinent labs & imaging results that were available during my care of the patient were reviewed by me and considered in my medical decision making (see chart for details).  Clinical Course as of Jan 20 2220  Wed Jan 20, 2020  2105 NEUT#(!): 16.0 [SP]    Clinical Course User Index [SP] Farrel Gordon, PA-C   MDM Rules/Calculators/A&P                      COLONEL KRAUSER is a 39 y.o. male with pertinent past medical history of type 2 diabetes that presents to the emergency department for nausea and  vomiting.  Patient states that he ate leftover [place] food and a couple hours later started having diarrhea and vomiting.  Normal abdominal exam, no back pain, no chest pain, no shortness of breath.  Will order CBC, CMP, lipase, urine.   I ordered, reviewed and interpreted labs which included leukocytosis, in addition to fever tachycardia will order abdominal CT.  CMP shows glucose elevated.  Creatinine most likely elevated due to dehydration.  Normal lipase. I ordered medications in the ER to include Zofran and fluids. I ordered imaging studies to include CT abdomen and I independently reviewed and interpreted by me which showed diarrheal state with possible mild enteritis without bowel obstruction with normal appendix.  Leukocytosis most likely reactive.   Patient with symptoms consistent with viral gastroenteritis.  Vitals are stable, no fever.  No signs of dehydration, tolerating PO fluids > 6 oz.  Lungs are clear.  CT abdomen negative. Pt passed PO challenge. No focal abdominal pain, no concern for appendicitis, cholecystitis, pancreatitis, ruptured viscus, UTI, kidney stone, or any other abdominal etiology.  Patient to be discharged.  Supportive therapy indicated with return if symptoms worsen.  Patient counseled, return precautions given.  I discussed this case with my attending physician, Dr, Hyacinth Meeker, who cosigned this note including patient's presenting symptoms, physical exam, and planned diagnostics and interventions. Attending physician stated agreement with plan or made changes to plan which were implemented.   Attending physician assessed patient at bedside.   Final Clinical Impression(s) / ED Diagnoses Final diagnoses:  None    Rx / DC Orders ED Discharge Orders    None       Farrel Gordon, PA-C 01/20/20 2355    Eber Hong, MD 01/21/20 [phone]

## 2020-01-20 NOTE — ED Provider Notes (Signed)
This patient is a pleasant 39 year old male, presents to the hospital with a complaint of nausea vomiting and diarrhea.  This started after noon today, he has been having severe symptoms starting in the mid afternoon after eating warmed up [place] food from the night before.  The patient has clear heart and lung sounds though he is mildly tachycardic.  He is afebrile.  He has had multiple episodes of watery diarrhea.  On my exam the patient is in no distress, he has absolutely no tenderness to palpation however given his severe leukocytosis of over 19,000 with tachycardia he would benefit from a CT scan to rule out other intra-abdominal pathology.  The patient was recently self treating himself for upper respiratory illness which has improved with over-the-counter Mucinex.  No recent steroids or antibiotics.  Medical screening examination/treatment/procedure(s) were conducted as a shared visit with non-physician practitioner(s) and myself.  I personally evaluated the patient during the encounter.  Clinical Impression:   Final diagnoses:  Nausea vomiting and diarrhea         Eber Hong, MD 01/21/20 1501

## 2020-01-20 NOTE — ED Notes (Signed)
Po challenge with crackers and water, pt denies nausea at this time. pt ambulatory to bathroom with steady gait

## 2020-01-20 NOTE — ED Triage Notes (Signed)
Pt presents to ED with complaints of emesis and generalized abdominal pain since 1730 today.

## 2020-01-20 NOTE — Discharge Instructions (Addendum)
You were seen today in the emergency department for nausea, vomiting, diarrhea.  This is most likely due to viral gastroenteritis (food poisoning).  Your blood work and CT scan were reassuring.  Your CT did show a mild fatty liver that I want you to follow-up with your PCP.  I also want you to talk to your PCP about your blood sugar today which was elevated and your creatinine which was also elevated (this is most likely due to you being dehydrated today).  Come back to the emergency department for worsening abdominal pain, inability to keep anything down, uncontrollable vomiting, fevers, continuous diarrhea.  Try and stick to a bland diet.  Make sure to drink plenty of fluids.  Take Zofran as needed.

## 2020-01-21 NOTE — ED Notes (Signed)
Went to get wheelchair to push pt out of facility and when I got to his room he was not there.

## 2020-01-22 ENCOUNTER — Encounter: Payer: Self-pay | Admitting: Family Medicine

## 2020-01-25 ENCOUNTER — Other Ambulatory Visit: Payer: BC Managed Care – PPO

## 2020-01-25 ENCOUNTER — Other Ambulatory Visit: Payer: Self-pay

## 2020-01-25 DIAGNOSIS — E1169 Type 2 diabetes mellitus with other specified complication: Secondary | ICD-10-CM

## 2020-01-25 LAB — BAYER DCA HB A1C WAIVED: HB A1C (BAYER DCA - WAIVED): 7.7 % — ABNORMAL HIGH (ref <7.0)

## 2020-01-26 LAB — CMP14+EGFR
ALT: 78 IU/L — ABNORMAL HIGH (ref 0–44)
AST: 37 IU/L (ref 0–40)
Albumin/Globulin Ratio: 1.9 (ref 1.2–2.2)
Albumin: 4.1 g/dL (ref 4.0–5.0)
Alkaline Phosphatase: 76 IU/L (ref 39–117)
BUN/Creatinine Ratio: 13 (ref 9–20)
BUN: 14 mg/dL (ref 6–20)
Bilirubin Total: 0.4 mg/dL (ref 0.0–1.2)
CO2: 23 mmol/L (ref 20–29)
Calcium: 9.5 mg/dL (ref 8.7–10.2)
Chloride: 104 mmol/L (ref 96–106)
Creatinine, Ser: 1.07 mg/dL (ref 0.76–1.27)
GFR calc Af Amer: 102 mL/min/{1.73_m2} (ref >59)
GFR calc non Af Amer: 88 mL/min/{1.73_m2} (ref >59)
Globulin, Total: 2.2 g/dL (ref 1.5–4.5)
Glucose: 113 mg/dL — ABNORMAL HIGH (ref 65–99)
Potassium: 4.2 mmol/L (ref 3.5–5.2)
Sodium: 141 mmol/L (ref 134–144)
Total Protein: 6.3 g/dL (ref 6.0–8.5)

## 2020-01-26 LAB — CBC WITH DIFFERENTIAL/PLATELET
Basophils Absolute: 0.1 10*3/uL (ref 0.0–0.2)
Basos: 1 %
EOS (ABSOLUTE): 0.1 10*3/uL (ref 0.0–0.4)
Eos: 2 %
Hematocrit: 43 % (ref 37.5–51.0)
Hemoglobin: 14.4 g/dL (ref 13.0–17.7)
Immature Grans (Abs): 0 10*3/uL (ref 0.0–0.1)
Immature Granulocytes: 0 %
Lymphocytes Absolute: 2.9 10*3/uL (ref 0.7–3.1)
Lymphs: 32 %
MCH: 26.8 pg (ref 26.6–33.0)
MCHC: 33.5 g/dL (ref 31.5–35.7)
MCV: 80 fL (ref 79–97)
Monocytes Absolute: 0.7 10*3/uL (ref 0.1–0.9)
Monocytes: 7 %
Neutrophils Absolute: 5.2 10*3/uL (ref 1.4–7.0)
Neutrophils: 58 %
Platelets: 283 10*3/uL (ref 150–450)
RBC: 5.37 x10E6/uL (ref 4.14–5.80)
RDW: 14.5 % (ref 11.6–15.4)
WBC: 9 10*3/uL (ref 3.4–10.8)

## 2020-01-26 LAB — LIPID PANEL
Chol/HDL Ratio: 5.1 ratio — ABNORMAL HIGH (ref 0.0–5.0)
Cholesterol, Total: 123 mg/dL (ref 100–199)
HDL: 24 mg/dL — ABNORMAL LOW (ref >39)
LDL Chol Calc (NIH): 65 mg/dL (ref 0–99)
Triglycerides: 204 mg/dL — ABNORMAL HIGH (ref 0–149)
VLDL Cholesterol Cal: 34 mg/dL (ref 5–40)

## 2020-01-27 ENCOUNTER — Telehealth (INDEPENDENT_AMBULATORY_CARE_PROVIDER_SITE_OTHER): Payer: BC Managed Care – PPO | Admitting: Family Medicine

## 2020-01-27 ENCOUNTER — Encounter: Payer: Self-pay | Admitting: Family Medicine

## 2020-01-27 DIAGNOSIS — K760 Fatty (change of) liver, not elsewhere classified: Secondary | ICD-10-CM | POA: Diagnosis not present

## 2020-01-27 DIAGNOSIS — E785 Hyperlipidemia, unspecified: Secondary | ICD-10-CM

## 2020-01-27 DIAGNOSIS — E1169 Type 2 diabetes mellitus with other specified complication: Secondary | ICD-10-CM

## 2020-01-27 DIAGNOSIS — K219 Gastro-esophageal reflux disease without esophagitis: Secondary | ICD-10-CM | POA: Diagnosis not present

## 2020-01-27 MED ORDER — OMEPRAZOLE 20 MG PO CPDR: 20.0000 mg | DELAYED_RELEASE_CAPSULE | Freq: Every day | ORAL | 3 refills | Status: DC

## 2020-01-27 MED ORDER — METFORMIN HCL 500 MG PO TABS: 500.0000 mg | ORAL_TABLET | Freq: Two times a day (BID) | ORAL | 3 refills | Status: DC

## 2020-01-27 NOTE — Progress Notes (Signed)
Virtual Visit via telephone Note  I connected with Thomas [name] on 01/27/20 at 1459 by telephone and verified that I am speaking with the correct person using two identifiers. Thomas [name] is currently located at home and no other people are currently with her during visit. The provider, Fransisca Kaufmann Andromeda Poppen, MD is located in their office at time of visit.  Call ended at 1518  I discussed the limitations, risks, security and privacy concerns of performing an evaluation and management service by telephone and the availability of in person appointments. I also discussed with the patient that there may be a patient responsible charge related to this service. The patient expressed understanding and agreed to proceed.   History and Present Illness: Type 2 diabetes mellitus Patient comes in today for recheck of his diabetes. Patient has been currently taking metformin, a1c 7.7. Patient is currently on an ACE inhibitor/ARB. Patient has not seen an ophthalmologist this year. Patient denies any issues with their feet.   GERD Patient is currently on prilosec.  She denies any major symptoms or abdominal pain or belching or burping. She denies any blood in her stool or lightheadedness or dizziness.   Hyperlipidemia Patient is coming in for recheck of his hyperlipidemia. The patient is currently taking diet control. They deny any issues with myalgias or history of liver damage from it. They deny any focal numbness or weakness or chest pain.   Fatty liver disease on ct scan. Discussed weight loss and exercise  No diagnosis found.  Outpatient Encounter Medications as of 01/27/2020  Medication Sig  . CREATINE PO Take 1 Scoop by mouth daily.  Marland Kitchen ibuprofen (ADVIL) 200 MG tablet Take 400 mg by mouth daily as needed for fever or moderate pain.  Marland Kitchen lisinopril (ZESTRIL) 20 MG tablet Take 1 tablet (20 mg total) by mouth daily.  . metFORMIN (GLUCOPHAGE) 500 MG tablet Take 2 tablets (1,000 mg total) by mouth 2  (two) times daily with a meal. (Patient taking differently: Take 500 mg by mouth in the morning. )  . Multiple Vitamin (MULTIVITAMIN WITH MINERALS) TABS tablet Take 1 tablet by mouth daily.  Marland Kitchen omeprazole (PRILOSEC) 20 MG capsule TAKE 1 CAPSULE BY MOUTH EVERY DAY (Patient taking differently: Take 20 mg by mouth daily. )  . ondansetron (ZOFRAN ODT) 4 MG disintegrating tablet Take 1 tablet (4 mg total) by mouth every 8 (eight) hours as needed for nausea or vomiting.   No facility-administered encounter medications on file as of 01/27/2020.    Review of Systems  Constitutional: Negative for chills and fever.  Eyes: Negative for visual disturbance.  Respiratory: Negative for shortness of breath and wheezing.   Cardiovascular: Negative for chest pain and leg swelling.  Musculoskeletal: Negative for back pain and gait problem.  Skin: Negative for rash.  Neurological: Negative for dizziness, weakness and light-headedness.  All other systems reviewed and are negative.   Observations/Objective: Patient sounds comfortable and in no acute distress  Assessment and Plan: Problem List Items Addressed This Visit      Digestive   GERD (gastroesophageal reflux disease)   Relevant Medications   omeprazole (PRILOSEC) 20 MG capsule   Fatty liver     Endocrine   Type 2 diabetes mellitus with other specified complication ([place]) - Primary   Relevant Medications   metFORMIN (GLUCOPHAGE) 500 MG tablet   Hyperlipidemia associated with type 2 diabetes mellitus ([place])   Relevant Medications   metFORMIN (GLUCOPHAGE) 500 MG tablet  Patient had been taking Metformin once a day at the 500 mg, recommended to increase to twice a day at 500 mg and he is continued with diet and exercise and I think eventually he will be able to come off of the medicine but for now we will continue like this. Follow up plan: Return in about 3 months (around 04/27/2020), or if symptoms worsen or fail to improve, for gerd and  diabetes and htn.     I discussed the assessment and treatment plan with the patient. The patient was provided an opportunity to ask questions and all were answered. The patient agreed with the plan and demonstrated an understanding of the instructions.   The patient was advised to call back or seek an in-person evaluation if the symptoms worsen or if the condition fails to improve as anticipated.  The above assessment and management plan was discussed with the patient. The patient verbalized understanding of and has agreed to the management plan. Patient is aware to call the clinic if symptoms persist or worsen. Patient is aware when to return to the clinic for a follow-up visit. Patient educated on when it is appropriate to go to the emergency department.    I provided 19 minutes of non-face-to-face time during this encounter.    Nils Pyle, MD

## 2020-02-02 ENCOUNTER — Telehealth: Payer: Self-pay | Admitting: Family Medicine

## 2020-02-02 NOTE — Telephone Encounter (Signed)
Left message to call back  

## 2020-02-09 NOTE — Telephone Encounter (Signed)
Patient was called .

## 2020-02-16 ENCOUNTER — Telehealth: Payer: Self-pay | Admitting: Family Medicine

## 2020-02-16 NOTE — Telephone Encounter (Signed)
Pt called to let us know that he has already spoken to Dr Louanne Skye and a few other people regarding his most recent lab results. Pt says for some reason he keeps getting calls from people to go over the same lab results. Pt aware of results and does not need to be called again.

## 2020-02-16 NOTE — Telephone Encounter (Signed)
Noted  

## 2020-03-09 ENCOUNTER — Encounter: Payer: Self-pay | Admitting: *Deleted

## 2020-03-16 DIAGNOSIS — M25561 Pain in right knee: Secondary | ICD-10-CM | POA: Diagnosis not present

## 2020-04-29 DIAGNOSIS — Z20822 Contact with and (suspected) exposure to covid-19: Secondary | ICD-10-CM | POA: Diagnosis not present

## 2020-05-08 DIAGNOSIS — Z20822 Contact with and (suspected) exposure to covid-19: Secondary | ICD-10-CM | POA: Diagnosis not present

## 2020-05-10 ENCOUNTER — Encounter: Payer: Self-pay | Admitting: Family Medicine

## 2020-05-11 ENCOUNTER — Ambulatory Visit: Payer: BC Managed Care – PPO | Admitting: Family Medicine

## 2020-05-13 ENCOUNTER — Encounter: Payer: Self-pay | Admitting: Family Medicine

## 2020-05-13 ENCOUNTER — Ambulatory Visit: Payer: BC Managed Care – PPO | Admitting: Family Medicine

## 2020-05-15 ENCOUNTER — Emergency Department (HOSPITAL_COMMUNITY): Payer: BC Managed Care – PPO

## 2020-05-15 ENCOUNTER — Observation Stay (HOSPITAL_COMMUNITY)
Admission: EM | Admit: 2020-05-15 | Discharge: 2020-05-16 | Disposition: A | Discharge status: Home / Self Care | Payer: BC Managed Care – PPO | Attending: Internal Medicine | Admitting: Internal Medicine

## 2020-05-15 ENCOUNTER — Other Ambulatory Visit: Payer: Self-pay

## 2020-05-15 ENCOUNTER — Encounter (HOSPITAL_COMMUNITY): Payer: Self-pay | Admitting: Emergency Medicine

## 2020-05-15 DIAGNOSIS — F17200 Nicotine dependence, unspecified, uncomplicated: Secondary | ICD-10-CM | POA: Insufficient documentation

## 2020-05-15 DIAGNOSIS — J189 Pneumonia, unspecified organism: Secondary | ICD-10-CM | POA: Diagnosis not present

## 2020-05-15 DIAGNOSIS — E860 Dehydration: Secondary | ICD-10-CM | POA: Diagnosis not present

## 2020-05-15 DIAGNOSIS — U071 COVID-19: Principal | ICD-10-CM | POA: Diagnosis present

## 2020-05-15 DIAGNOSIS — R000 Tachycardia, unspecified: Secondary | ICD-10-CM | POA: Diagnosis not present

## 2020-05-15 DIAGNOSIS — I152 Hypertension secondary to endocrine disorders: Secondary | ICD-10-CM | POA: Diagnosis present

## 2020-05-15 DIAGNOSIS — Z7984 Long term (current) use of oral hypoglycemic drugs: Secondary | ICD-10-CM | POA: Diagnosis not present

## 2020-05-15 DIAGNOSIS — K219 Gastro-esophageal reflux disease without esophagitis: Secondary | ICD-10-CM

## 2020-05-15 DIAGNOSIS — E871 Hypo-osmolality and hyponatremia: Secondary | ICD-10-CM | POA: Insufficient documentation

## 2020-05-15 DIAGNOSIS — E1165 Type 2 diabetes mellitus with hyperglycemia: Secondary | ICD-10-CM | POA: Diagnosis not present

## 2020-05-15 DIAGNOSIS — R509 Fever, unspecified: Secondary | ICD-10-CM | POA: Diagnosis not present

## 2020-05-15 DIAGNOSIS — I10 Essential (primary) hypertension: Secondary | ICD-10-CM | POA: Diagnosis present

## 2020-05-15 DIAGNOSIS — E1159 Type 2 diabetes mellitus with other circulatory complications: Secondary | ICD-10-CM | POA: Diagnosis present

## 2020-05-15 LAB — BASIC METABOLIC PANEL
Anion gap: 13 (ref 5–15)
BUN: 14 mg/dL (ref 6–20)
CO2: 20 mmol/L — ABNORMAL LOW (ref 22–32)
Calcium: 8.8 mg/dL — ABNORMAL LOW (ref 8.9–10.3)
Chloride: 100 mmol/L (ref 98–111)
Creatinine, Ser: 1.06 mg/dL (ref 0.61–1.24)
GFR calc Af Amer: >60 mL/min (ref >60)
GFR calc non Af Amer: >60 mL/min (ref >60)
Glucose, Bld: 147 mg/dL — ABNORMAL HIGH (ref 70–99)
Potassium: 3.8 mmol/L (ref 3.5–5.1)
Sodium: 133 mmol/L — ABNORMAL LOW (ref 135–145)

## 2020-05-15 LAB — CBC
HCT: 45.7 % (ref 39.0–52.0)
Hemoglobin: 14.9 g/dL (ref 13.0–17.0)
MCH: 25.9 pg — ABNORMAL LOW (ref 26.0–34.0)
MCHC: 32.6 g/dL (ref 30.0–36.0)
MCV: 79.5 fL — ABNORMAL LOW (ref 80.0–100.0)
Platelets: 187 10*3/uL (ref 150–400)
RBC: 5.75 MIL/uL (ref 4.22–5.81)
RDW: 13.9 % (ref 11.5–15.5)
WBC: 5.2 10*3/uL (ref 4.0–10.5)
nRBC: 0 % (ref 0.0–0.2)

## 2020-05-15 NOTE — ED Triage Notes (Signed)
Pt tested +covid x 1 week ago, reports recurrent fevers and low O2 saturations.

## 2020-05-16 ENCOUNTER — Encounter (HOSPITAL_COMMUNITY): Payer: Self-pay | Admitting: Internal Medicine

## 2020-05-16 DIAGNOSIS — E1165 Type 2 diabetes mellitus with hyperglycemia: Secondary | ICD-10-CM | POA: Diagnosis not present

## 2020-05-16 DIAGNOSIS — U071 COVID-19: Secondary | ICD-10-CM | POA: Diagnosis present

## 2020-05-16 DIAGNOSIS — E860 Dehydration: Secondary | ICD-10-CM | POA: Diagnosis not present

## 2020-05-16 DIAGNOSIS — R509 Fever, unspecified: Secondary | ICD-10-CM | POA: Diagnosis not present

## 2020-05-16 DIAGNOSIS — F17200 Nicotine dependence, unspecified, uncomplicated: Secondary | ICD-10-CM | POA: Diagnosis not present

## 2020-05-16 DIAGNOSIS — K219 Gastro-esophageal reflux disease without esophagitis: Secondary | ICD-10-CM | POA: Diagnosis not present

## 2020-05-16 DIAGNOSIS — E871 Hypo-osmolality and hyponatremia: Secondary | ICD-10-CM | POA: Diagnosis present

## 2020-05-16 DIAGNOSIS — Z7984 Long term (current) use of oral hypoglycemic drugs: Secondary | ICD-10-CM | POA: Diagnosis not present

## 2020-05-16 DIAGNOSIS — I10 Essential (primary) hypertension: Secondary | ICD-10-CM | POA: Diagnosis not present

## 2020-05-16 DIAGNOSIS — R000 Tachycardia, unspecified: Secondary | ICD-10-CM | POA: Diagnosis not present

## 2020-05-16 DIAGNOSIS — E1159 Type 2 diabetes mellitus with other circulatory complications: Secondary | ICD-10-CM | POA: Diagnosis present

## 2020-05-16 LAB — LACTATE DEHYDROGENASE: LDH: 185 U/L (ref 98–192)

## 2020-05-16 LAB — PROCALCITONIN: Procalcitonin: 0.13 ng/mL

## 2020-05-16 LAB — CBG MONITORING, ED: Glucose-Capillary: 195 mg/dL — ABNORMAL HIGH (ref 70–99)

## 2020-05-16 LAB — HEMOGLOBIN A1C
Hgb A1c MFr Bld: 8.3 % — ABNORMAL HIGH (ref 4.8–5.6)
Mean Plasma Glucose: 191.51 mg/dL

## 2020-05-16 LAB — HIV ANTIBODY (ROUTINE TESTING W REFLEX): HIV Screen 4th Generation wRfx: NONREACTIVE

## 2020-05-16 LAB — BRAIN NATRIURETIC PEPTIDE: B Natriuretic Peptide: 7.4 pg/mL (ref 0.0–100.0)

## 2020-05-16 LAB — FERRITIN: Ferritin: 275 ng/mL (ref 24–336)

## 2020-05-16 LAB — D-DIMER, QUANTITATIVE: D-Dimer, Quant: <0.27 ug/mL-FEU (ref 0.00–0.50)

## 2020-05-16 LAB — C-REACTIVE PROTEIN: CRP: 7.7 mg/dL — ABNORMAL HIGH (ref <1.0)

## 2020-05-16 LAB — ABO/RH: ABO/RH(D): A POS

## 2020-05-16 MED ORDER — DEXAMETHASONE 6 MG PO TABS: 6.0000 mg | ORAL_TABLET | Freq: Every day | ORAL | 0 refills | Status: DC

## 2020-05-16 MED ORDER — SODIUM CHLORIDE 0.9 % IV BOLUS
500.0000 mL | Freq: Once | INTRAVENOUS | Status: AC
  Administered 2020-05-16: 500 mL via INTRAVENOUS

## 2020-05-16 MED ORDER — PANTOPRAZOLE SODIUM 40 MG PO TBEC
40.0000 mg | DELAYED_RELEASE_TABLET | Freq: Every day | ORAL | Status: DC
  Administered 2020-05-16: 40 mg via ORAL
  Filled 2020-05-16: qty 1

## 2020-05-16 MED ORDER — GUAIFENESIN-DM 100-10 MG/5ML PO SYRP: 10.0000 mL | ORAL_SOLUTION | ORAL | Status: DC | PRN

## 2020-05-16 MED ORDER — POLYETHYLENE GLYCOL 3350 17 G PO PACK: 17.0000 g | PACK | Freq: Every day | ORAL | Status: DC | PRN

## 2020-05-16 MED ORDER — ONDANSETRON HCL 4 MG/2ML IJ SOLN
4.0000 mg | Freq: Four times a day (QID) | INTRAMUSCULAR | Status: DC | PRN
  Administered 2020-05-16: 4 mg via INTRAVENOUS
  Filled 2020-05-16: qty 2

## 2020-05-16 MED ORDER — LACTATED RINGERS IV BOLUS
1000.0000 mL | Freq: Once | INTRAVENOUS | Status: AC
  Administered 2020-05-16: 1000 mL via INTRAVENOUS

## 2020-05-16 MED ORDER — ASCORBIC ACID 500 MG PO TABS
500.0000 mg | ORAL_TABLET | Freq: Every day | ORAL | Status: DC
  Administered 2020-05-16: 500 mg via ORAL
  Filled 2020-05-16: qty 1

## 2020-05-16 MED ORDER — DEXAMETHASONE SODIUM PHOSPHATE 10 MG/ML IJ SOLN
6.0000 mg | Freq: Every day | INTRAMUSCULAR | Status: DC
  Administered 2020-05-16: 6 mg via INTRAVENOUS
  Filled 2020-05-16: qty 1

## 2020-05-16 MED ORDER — ENOXAPARIN SODIUM 40 MG/0.4ML ~~LOC~~ SOLN
40.0000 mg | Freq: Every day | SUBCUTANEOUS | Status: DC
  Administered 2020-05-16: 40 mg via SUBCUTANEOUS
  Filled 2020-05-16: qty 0.4

## 2020-05-16 MED ORDER — ONDANSETRON HCL 4 MG PO TABS: 4.0000 mg | ORAL_TABLET | Freq: Four times a day (QID) | ORAL | Status: DC | PRN

## 2020-05-16 MED ORDER — ALBUTEROL SULFATE HFA 108 (90 BASE) MCG/ACT IN AERS
2.0000 | INHALATION_SPRAY | Freq: Four times a day (QID) | RESPIRATORY_TRACT | Status: DC
  Administered 2020-05-16: 2 via RESPIRATORY_TRACT
  Filled 2020-05-16: qty 6.7

## 2020-05-16 MED ORDER — ZINC SULFATE 220 (50 ZN) MG PO CAPS
220.0000 mg | ORAL_CAPSULE | Freq: Every day | ORAL | Status: DC
  Administered 2020-05-16: 220 mg via ORAL
  Filled 2020-05-16: qty 1

## 2020-05-16 MED ORDER — LACTATED RINGERS IV SOLN: INTRAVENOUS | Status: AC

## 2020-05-16 MED ORDER — KETOROLAC TROMETHAMINE 30 MG/ML IJ SOLN
30.0000 mg | Freq: Once | INTRAMUSCULAR | Status: AC
  Administered 2020-05-16: 30 mg via INTRAVENOUS
  Filled 2020-05-16: qty 1

## 2020-05-16 MED ORDER — SODIUM CHLORIDE 0.9 % IV SOLN: 100.0000 mg | Freq: Every day | INTRAVENOUS | Status: DC

## 2020-05-16 MED ORDER — SODIUM CHLORIDE 0.9 % IV SOLN
200.0000 mg | Freq: Once | INTRAVENOUS | Status: AC
  Administered 2020-05-16: 200 mg via INTRAVENOUS
  Filled 2020-05-16: qty 40

## 2020-05-16 MED ORDER — ACETAMINOPHEN 325 MG PO TABS: 650.0000 mg | ORAL_TABLET | Freq: Four times a day (QID) | ORAL | Status: DC | PRN

## 2020-05-16 MED ORDER — INSULIN ASPART 100 UNIT/ML ~~LOC~~ SOLN
0.0000 [IU] | Freq: Three times a day (TID) | SUBCUTANEOUS | Status: DC
  Administered 2020-05-16: 3 [IU] via SUBCUTANEOUS

## 2020-05-16 MED ORDER — LISINOPRIL 20 MG PO TABS
20.0000 mg | ORAL_TABLET | Freq: Every day | ORAL | Status: DC
  Administered 2020-05-16: 20 mg via ORAL
  Filled 2020-05-16: qty 1

## 2020-05-16 NOTE — ED Notes (Signed)
Patient verbalizes understanding of discharge instructions. Opportunity for questioning and answers were provided. Armband removed by staff, pt discharged from ED ambulayory.

## 2020-05-16 NOTE — ED Notes (Addendum)
Provider and RN bedside w/ wife on phone.  Pt was started on several medications including eliquis.  Pt has been taking 1g of Tylenol every 6 hr.

## 2020-05-16 NOTE — ED Provider Notes (Signed)
[name] EMERGENCY DEPARTMENT Provider Note   CSN: [id number] Arrival date & time: 05/15/20  2043     History Chief Complaint  Patient presents with   Fever    Thomas [name] is a 39 y.o. male with a history of pityriasis licneoides et varioliformis acuta, diabetes mellitus type 2, hyperlipidemia, HTN, and fatty liver who presents to the emergency department with a chief complaint of fever.  The patient tested positive for COVID-19 1 week ago after he developed a headache.  Reports that he had a fever 6 days ago that seemed to resolve until the last 48 hours.  Reports that he has been taking 1000 mg of Tylenol once every 6 hours for fever at home.  No NSAIDs.  His wife is a respiratory therapist and states that his oxygen saturations have been documented at 85 - 94% on room air at home.  He has not been feeling short of breath and denies chest pain.  He has had associated headaches, cough, loss of sense of taste and smell, and nausea.  He had diarrhea earlier this week, but it has since resolved.  No abdominal pain, dysuria, hematuria, or rash.  Reports that he called his primary care office at [facility] family [facility] and to the on-call clinician called him in prescriptions of Eliquis, ivermectin, and colchicine.  He reports that he started these medications today.   He has not been vaccinated against COVID-19.  He has not been checking his blood sugar at home.  Thomas [name] was evaluated in Emergency Department on 05/16/2020 for the symptoms described in the history of present illness. He was evaluated in the context of the global COVID-19 pandemic, which necessitated consideration that the patient might be at risk for infection with the SARS-CoV-2 virus that causes COVID-19. Institutional protocols and algorithms that pertain to the evaluation of patients at risk for COVID-19 are in a state of rapid change based on information released by regulatory  bodies including the CDC and federal and state organizations. These policies and algorithms were followed during the patient's care in the ED.  The history is provided by the patient and medical records. No language interpreter was used.       Past Medical History:  Diagnosis Date   Plantar fasciitis     Patient Active Problem List   Diagnosis Date Noted   COVID-19 virus infection 05/16/2020   Essential hypertension 05/16/2020   Uncontrolled type 2 diabetes mellitus with hyperglycemia, without long-term current use of insulin (HCC) 05/16/2020   Dehydration with hyponatremia 05/16/2020   Hyperlipidemia associated with type 2 diabetes mellitus (HCC) 01/27/2020   GERD without esophagitis 01/27/2020   Fatty liver 01/27/2020   Type 2 diabetes mellitus with other specified complication (HCC) 01/24/2017   PLEVA (pityriasis lichenoides et varioliformis acuta) 12/28/2015    History reviewed. No pertinent surgical history.     Family History  Problem Relation Age of Onset   Diabetes Father    Kidney Stones Father    Hypertension Father    Heart disease Maternal Grandmother    COPD Maternal Grandmother    Heart disease Maternal Grandfather    Hypertension Maternal Grandfather    Diabetes Paternal Grandmother    Diabetes Paternal Grandfather    Heart disease Paternal Grandfather     Social History   Tobacco Use   Smoking status: Current Every Day Smoker    Packs/day: 0.25   Smokeless tobacco: Never Used  Vaping Use  Vaping Use: Never used  Substance Use Topics   Alcohol use: Yes    Comment: occasional   Drug use: No    Home Medications Prior to Admission medications   Medication Sig Start Date End Date Taking? Authorizing Provider  lisinopril (ZESTRIL) 20 MG tablet Take 1 tablet (20 mg total) by mouth daily. 10/14/19  Yes Dettinger, Elige RadonJoshua A, MD  metFORMIN (GLUCOPHAGE) 500 MG tablet Take 1 tablet (500 mg total) by mouth 2 (two) times daily  with a meal. 01/27/20  Yes Dettinger, Elige RadonJoshua A, MD  Multiple Vitamin (MULTIVITAMIN WITH MINERALS) TABS tablet Take 1 tablet by mouth daily.   Yes [provider]  omeprazole (PRILOSEC) 20 MG capsule Take 1 capsule (20 mg total) by mouth daily. 01/27/20  Yes Dettinger, Elige RadonJoshua A, MD  ondansetron (ZOFRAN ODT) 4 MG disintegrating tablet Take 1 tablet (4 mg total) by mouth every 8 (eight) hours as needed for nausea or vomiting. Patient not taking: Reported on 05/16/2020 01/20/20   Farrel GordonPatel, Shalyn, PA-C    Allergies    Amoxicillin and Penicillins  Review of Systems   Review of Systems  Constitutional: Positive for chills and fever. Negative for appetite change.  HENT:       Loss of sense of taste and smell  Respiratory: Positive for cough. Negative for shortness of breath.   Cardiovascular: Negative for chest pain.  Gastrointestinal: Positive for diarrhea (resolved). Negative for abdominal pain, nausea and vomiting.  Genitourinary: Negative for dysuria and urgency.  Musculoskeletal: Negative for back pain, neck pain and neck stiffness.  Skin: Negative for rash.  Allergic/Immunologic: Negative for immunocompromised state.  Neurological: Positive for headaches. Negative for dizziness, weakness and numbness.  Psychiatric/Behavioral: Negative for confusion.    Physical Exam Updated Vital Signs BP 101/73    Pulse 95    Temp 99.2 F (37.3 C) (Oral)    Resp 18    SpO2 (!) 89%   Physical Exam Vitals and nursing note reviewed.  Constitutional:      General: He is not in acute distress.    Appearance: He is well-developed. He is obese. He is not ill-appearing, toxic-appearing or diaphoretic.     Comments: No acute distress.  Nontoxic-appearing.  HENT:     Head: Normocephalic.     Mouth/Throat:     Mouth: Mucous membranes are moist.  Eyes:     Conjunctiva/sclera: Conjunctivae normal.  Cardiovascular:     Rate and Rhythm: Regular rhythm. Tachycardia present.     Heart sounds: Normal  heart sounds. No murmur heard.  No friction rub. No gallop.   Pulmonary:     Effort: Pulmonary effort is normal. No respiratory distress.     Breath sounds: No stridor. No wheezing, rhonchi or rales.     Comments: Lungs are clear to auscultation bilaterally.  Good air movement throughout. Chest:     Chest wall: No tenderness.  Abdominal:     General: There is no distension.     Palpations: Abdomen is soft. There is no mass.     Tenderness: There is no abdominal tenderness. There is no right CVA tenderness, left CVA tenderness, guarding or rebound.     Hernia: No hernia is present.  Musculoskeletal:     Cervical back: Neck supple.     Right lower leg: No edema.     Left lower leg: No edema.  Skin:    General: Skin is warm and dry.     Capillary Refill: Capillary refill takes less than 2  seconds.  Neurological:     Mental Status: He is alert.  Psychiatric:        Behavior: Behavior normal.     ED Results / Procedures / Treatments   Labs (all labs ordered are listed, but only abnormal results are displayed) Labs Reviewed  BASIC METABOLIC PANEL - Abnormal; Notable for the following components:      Result Value   Sodium 133 (*)    CO2 20 (*)    Glucose, Bld 147 (*)    Calcium 8.8 (*)    All other components within normal limits  CBC - Abnormal; Notable for the following components:   MCV 79.5 (*)    MCH 25.9 (*)    All other components within normal limits  CULTURE, BLOOD (ROUTINE X 2)  CULTURE, BLOOD (ROUTINE X 2)  HEMOGLOBIN A1C  HIV ANTIBODY (ROUTINE TESTING W REFLEX)  C-REACTIVE PROTEIN  BRAIN NATRIURETIC PEPTIDE  D-DIMER, QUANTITATIVE (NOT AT [facility])  FERRITIN  LACTATE DEHYDROGENASE  PROCALCITONIN  ABO/RH    EKG EKG Interpretation  Date/Time:  Sunday May 15 2020 21:09:15 EDT Ventricular Rate:  132 PR Interval:  134 QRS Duration: 84 QT Interval:  292 QTC Calculation: 432 R Axis:   34 Text Interpretation: Sinus tachycardia Possible Left atrial  enlargement Borderline ECG No old tracing to compare Confirmed by Dione Booze ([id number]) on 05/15/2020 11:08:44 PM   Radiology DG Chest Portable 1 View  Result Date: 05/15/2020 CLINICAL DATA:  COVID, fever EXAM: PORTABLE CHEST 1 VIEW COMPARISON:  None. FINDINGS: Patchy airspace opacities in the left lower lung. No pleural effusion. Normal cardiomediastinal silhouette. No pneumothorax. IMPRESSION: Patchy airspace opacities in the left lower lung suspicious for pneumonia. Electronically Signed   By: Jasmine Pang M.D.   On: 05/15/2020 21:12    Procedures Procedures (including critical care time)  Medications Ordered in ED Medications  lisinopril (ZESTRIL) tablet 20 mg (has no administration in time range)  pantoprazole (PROTONIX) EC tablet 40 mg (has no administration in time range)  insulin aspart (novoLOG) injection 0-15 Units (has no administration in time range)  enoxaparin (LOVENOX) injection 40 mg (has no administration in time range)  remdesivir 200 mg in sodium chloride 0.9% 250 mL IVPB (has no administration in time range)    Followed by  remdesivir 100 mg in sodium chloride 0.9 % 100 mL IVPB (has no administration in time range)  albuterol (VENTOLIN HFA) 108 (90 Base) MCG/ACT inhaler 2 puff (has no administration in time range)  dexamethasone (DECADRON) injection 6 mg (has no administration in time range)  guaiFENesin-dextromethorphan (ROBITUSSIN DM) 100-10 MG/5ML syrup 10 mL (has no administration in time range)  ascorbic acid (VITAMIN C) tablet 500 mg (has no administration in time range)  zinc sulfate capsule 220 mg (has no administration in time range)  polyethylene glycol (MIRALAX / GLYCOLAX) packet 17 g (has no administration in time range)  acetaminophen (TYLENOL) tablet 650 mg (has no administration in time range)  ondansetron (ZOFRAN) tablet 4 mg (has no administration in time range)    Or  ondansetron (ZOFRAN) injection 4 mg (has no administration in time range)    lactated ringers bolus 1,000 mL (has no administration in time range)    Followed by  lactated ringers infusion (has no administration in time range)  sodium chloride 0.9 % bolus 500 mL (0 mLs Intravenous Stopped 05/16/20 0159)  ketorolac (TORADOL) 30 MG/ML injection 30 mg (30 mg Intravenous Given 05/16/20 0042)    ED Course  I have  reviewed the triage vital signs and the nursing notes.  Pertinent labs & imaging results that were available during my care of the patient were reviewed by me and considered in my medical decision making (see chart for details).    MDM Rules/Calculators/A&P                          39 year old male with a history of pityriasis licneoides et varioliformis acuta, diabetes mellitus type 2, hyperlipidemia, HTN, and fatty liver who tested positive for COVID-19 on 8/9 and is presenting with a chief complaint of fever.  He has been treating his symptoms with 1000 g of Tylenol every 6 hours.  He was prescibed ivermectin, colchicine, and Eliquis by primary care 1 day ago.  VITALS: Tachycardic in the 130s on arrival that improved to 105-110.  Mildly tachypneic.  Was initially afebrile, but developed a fever to 100.6.  RN notes 2 episodes of hypoxia to 88% and 89% while patient is at rest that lasted for approximately a minute each time with good waveform on the monitor.  INTERVENTIONS: Toradol for fever and 500 cc fluid bolus.  EKG with sinus tachycardia.  IMAGING: Chest x-ray with left lower lung pneumonia on my evaluation.  LABS: No significant metabolic derangements.  Glucose is 147.  Patient febrile on arrival to the room and was treated with Toradol since he has been taking 4 g of Tylenol daily, but no NSAIDs.  He was also treated with 500 cc fluid bolus since he had diarrhea several days ago that is since resolved.  Clinically, he does not look dehydrated.  Heart rate improved to low 100's with this regimen.  Patient was then ambulated for 2-minute walk test by RN  and although he did not have any hypoxia, heart rate significantly elevated into the 130s.  I also assessed the patient at bedside and patient went from sitting to standing and heart rate increased into the 130s.  Discussed the patient with Dr. Preston Fleeting, attending physician.  Given that he has now had 2 abnormal vital signs, intermittent hypoxia, and significant tachycardia with risk factors of hypertension, obesity, and diabetes mellitus type 2, the patient will require admission for COVID-19 for further management and evaluation.  CONSULT to the hospitalist team and Dr. Leafy Half will accept the patient for admission.   The patient appears reasonably stabilized for admission considering the current resources, flow, and capabilities available in the ED at this time, and I doubt any other [facility] requiring further screening and/or treatment in the ED prior to admission.   Final Clinical Impression(s) / ED Diagnoses Final diagnoses:  COVID-19  Tachycardia    Rx / DC Orders ED Discharge Orders    None       Barkley Boards, PA-C 05/16/20 0419    Dione Booze, MD 05/16/20 [phone]

## 2020-05-16 NOTE — ED Notes (Signed)
Ordered breakfast 

## 2020-05-16 NOTE — ED Notes (Signed)
Ambulated pt in room without O2. Pt's O2 was between 95%-97%. Pt's pulse was initially 102, but elevated to 113. Informed Ryan - RN.

## 2020-05-16 NOTE — Discharge Summary (Signed)
Thomas [name], is a 39 y.o. male  DOB [birth year]  MRN [id number].  Admission date:  05/15/2020  Admitting Physician  Marinda Elk, MD  Discharge Date:  05/16/2020   Primary MD  Dettinger, Elige Radon, MD  Recommendations for primary care physician for things to follow:  - please check CBC, CMP during next visit.   Admission Diagnosis  COVID-19 virus infection [U07.1]   Discharge Diagnosis  COVID-19 virus infection [U07.1]   Principal Problem:   COVID-19 virus infection Active Problems:   GERD without esophagitis   Essential hypertension   Uncontrolled type 2 diabetes mellitus with hyperglycemia, without long-term current use of insulin (HCC)   Dehydration with hyponatremia      Past Medical History:  Diagnosis Date  . Plantar fasciitis     History reviewed. No pertinent surgical history.     History of present illness and  Hospital Course:     Kindly see H&P for history of present illness and admission details, please review complete Labs, Consult reports and Test reports for all details in brief  HPI  from the history and physical done on the day of admission 05/15/2020  39 year old male with past medical history of diabetes mellitus type 2, gastroesophageal reflux disease, hyperlipidemia, hypertension and obesity who presents to [facility] emergency department with complaints of fevers and weakness.  Patient explains that slightly over 1 week ago he began to develop headaches, generalized weakness and poor appetite.  Several days afterwards, the patient began to lose his taste and developed a dry nonproductive cough.  Patient presented to a local [facility] urgent care clinic on August 8 and was tested for Covid which came back positive.  In the days that followed, patient developed progressively worsening generalized myalgias and weakness.  This is associated with worsening  poor appetite and dry nonproductive cough with an anosmia and ageusia.  Patient did begin to develop mild shortness of breath, worse with exertion and improved with rest as the patient symptoms worsened.  Patient also exhibited associated headaches.  Patient reports that his wife has been sick with COVID-19 at approximately the same time.  Of note, the patient has not been vaccinated.  In the past few days prior to his presentation, the patient has been experiencing frequent bouts of fever.  This is what prompted him to present to [facility] emergency department for evaluation.  Upon evaluation in the emergency department, patient has been found to exhibit substantial tachycardia and fever of 100.6 F.  Patient is also been exhibiting frequent bouts of hypoxia as low as 88%.  Chest x-ray revealed left lower lobe infiltrates.  Due to the patient's persisting tachycardia with bouts of hypoxia likely requiring oxygen supplementation and known COVID-19 diagnosis the hospitalist group has been called to assess the patient for admission to the hospital.  Hospital Course   COVID 19 infection/COVID-19 pneumonia -Patient presents with generalized symptoms of fatigue, fever, body ache, when I have seen him he denies any dyspnea, he does report  some cough, I have monitored him on room air at rest for 10 minutes with no hypoxia, as well he was ambulated by staff in the room on room air with no hypoxia maintaining 97% on room air, reports he is feeling better, he is started on remdesivir and steroids, with the patient, offered to stay overnight for observation, but he reports he is rather go home, so I have arranged for remdesivir clinic for next 4 days, he will be discharged on 9 days of Decadron course to finish total of 10 days of therapy, he was instructed to come back to ED if he develops any hypoxia or shortness of breath, patient reports he will obtain pulse oximeter to monitor his oxygen  saturation. -Dimer is within normal limit.  Discharge Condition:  Stable.  Follow UP   Follow-up Information    Dettinger, Elige Radon, MD Follow up in 2 week(s).   Specialties: Family Medicine, Cardiology Contact information: [address] [place] [address] [phone]                 Discharge Instructions  and  Discharge Medications     Discharge Instructions    Discharge instructions   Complete by: As directed    Follow with Primary MD Dettinger, Elige Radon, MD in 10 days   Get CBC, CMP, 2 view Chest X ray checked  by Primary MD next visit.    Activity: As tolerated with Full fall precautions use walker/cane & assistance as needed   Disposition Home    Diet: Heart Healthy /carb modified , with feeding assistance and aspiration precautions.  On your next visit with your primary care physician please Get Medicines reviewed and adjusted.   Please request your Prim.MD to go over all Hospital Tests and Procedure/Radiological results at the follow up, please get all Hospital records sent to your Prim MD by signing hospital release before you go home.   If you experience worsening of your admission symptoms, develop shortness of breath, life threatening emergency, suicidal or homicidal thoughts you must seek medical attention immediately by calling 911 or calling your MD immediately  if symptoms less severe.  You Must read complete instructions/literature along with all the possible adverse reactions/side effects for all the Medicines you take and that have been prescribed to you. Take any new Medicines after you have completely understood and accpet all the possible adverse reactions/side effects.   Do not drive, operating heavy machinery, perform activities at heights, swimming or participation in water activities or provide baby sitting services if your were admitted for syncope or siezures until you have seen by Primary MD or a Neurologist and advised to do so  again.  Do not drive when taking Pain medications.    Do not take more than prescribed Pain, Sleep and Anxiety Medications  Special Instructions: If you have smoked or chewed Tobacco  in the last 2 yrs please stop smoking, stop any regular Alcohol  and or any Recreational drug use.  Wear Seat belts while driving.   Please note  You were cared for by a hospitalist during your hospital stay. If you have any questions about your discharge medications or the care you received while you were in the hospital after you are discharged, you can call the unit and asked to speak with the hospitalist on call if the hospitalist that took care of you is not available. Once you are discharged, your primary care physician will handle any further medical issues. Please  note that NO REFILLS for any discharge medications will be authorized once you are discharged, as it is imperative that you return to your primary care physician (or establish a relationship with a primary care physician if you do not have one) for your aftercare needs so that they can reassess your need for medications and monitor your lab values.   Increase activity slowly   Complete by: As directed    MyChart COVID-19 home monitoring program   Complete by: May 16, 2020    Is the patient willing to use the MyChart Mobile App for home monitoring?: Yes   Temperature monitoring   Complete by: May 16, 2020    After how many days would you like to receive a notification of this patient's flowsheet entries?: 1     Allergies as of 05/16/2020      Reactions   Amoxicillin Hives, Rash   Penicillins Hives, Rash      Medication List    TAKE these medications   dexamethasone 6 MG tablet Commonly known as: DECADRON Take 1 tablet (6 mg total) by mouth daily. Start taking on: May 17, 2020   lisinopril 20 MG tablet Commonly known as: ZESTRIL Take 1 tablet (20 mg total) by mouth daily.   metFORMIN 500 MG tablet Commonly known as:  GLUCOPHAGE Take 1 tablet (500 mg total) by mouth 2 (two) times daily with a meal.   multivitamin with minerals Tabs tablet Take 1 tablet by mouth daily.   omeprazole 20 MG capsule Commonly known as: PRILOSEC Take 1 capsule (20 mg total) by mouth daily.   ondansetron 4 MG disintegrating tablet Commonly known as: Zofran ODT Take 1 tablet (4 mg total) by mouth every 8 (eight) hours as needed for nausea or vomiting.         Diet and Activity recommendation: See Discharge Instructions above   Consults obtained -  None  Major procedures and Radiology Reports - PLEASE review detailed and final reports for all details, in brief -      DG Chest Portable 1 View  Result Date: 05/15/2020 CLINICAL DATA:  COVID, fever EXAM: PORTABLE CHEST 1 VIEW COMPARISON:  None. FINDINGS: Patchy airspace opacities in the left lower lung. No pleural effusion. Normal cardiomediastinal silhouette. No pneumothorax. IMPRESSION: Patchy airspace opacities in the left lower lung suspicious for pneumonia. Electronically Signed   By: Jasmine Pang M.D.   On: 05/15/2020 21:12    Micro Results     No results found for this or any previous visit (from the past 240 hour(s)).     Today   Subjective:   Antwine Agosto today has no headache,no chest abdominal pain,no new weakness tingling or numbness, feels much better wants to go home today.  Patient denies any shortness of breath currently, he ambulated in the hallway where he is 97% on room air.  Objective:   Blood pressure (!) 129/104, pulse (!) 111, temperature 98.8 F (37.1 C), resp. rate (!) 21, SpO2 94 %.  No intake or output data in the 24 hours ending 05/16/20 1423  Exam Awake Alert, Oriented x 3, No new F.N deficits, Normal affect  Symmetrical Chest wall movement, Good air movement bilaterally, CTAB RRR,No Gallops,Rubs or new Murmurs, No Parasternal Heave +ve B.Sounds, Abd Soft, Non tender, No organomegaly appriciated, No rebound -guarding or  rigidity. No Cyanosis, Clubbing or edema, No new Rash or bruise  Data Review   CBC w Diff:  Lab Results  Component Value Date   WBC  5.2 05/15/2020   HGB 14.9 05/15/2020   HGB 14.4 01/25/2020   HCT 45.7 05/15/2020   HCT 43.0 01/25/2020   PLT 187 05/15/2020   PLT 283 01/25/2020   LYMPHOPCT 8 01/20/2020   MONOPCT 7 01/20/2020   EOSPCT 1 01/20/2020   BASOPCT 0 01/20/2020    CMP:  Lab Results  Component Value Date   NA 133 (L) 05/15/2020   NA 141 01/25/2020   K 3.8 05/15/2020   CL 100 05/15/2020   CO2 20 (L) 05/15/2020   BUN 14 05/15/2020   BUN 14 01/25/2020   CREATININE 1.06 05/15/2020   PROT 6.3 01/25/2020   ALBUMIN 4.1 01/25/2020   BILITOT 0.4 01/25/2020   ALKPHOS 76 01/25/2020   AST 37 01/25/2020   ALT 78 (H) 01/25/2020  .   Total Time in preparing paper work, data evaluation and todays exam - 30 minutes  Huey Bienenstockawood Genavie Boettger M.D on 05/16/2020 at 2:23 PM  [facility]   Office  [phone]

## 2020-05-16 NOTE — Discharge Instructions (Signed)
You are scheduled for a Remdesivir infusion at 12 noon for the next 4 days (8/17. 8/18, 8/19, 8/20).  Please come to [address], you will see a COVID infusion banner by the road.  Enter there and turn left.  There are marked spaces for Infusion.  Call the number on the sign or [phone] and someone will come out and bring you inside.  If someone is driving you please come to the same area and call the number and someone will come outside to get you. Thank you!     Person Under Monitoring Name: Thomas [name]  Location: [address] [place] [address]   Infection Prevention Recommendations for Individuals Confirmed to have, or Being Evaluated for, 2019 Novel Coronavirus (COVID-19) Infection Who Receive Care at Home  Individuals who are confirmed to have, or are being evaluated for, COVID-19 should follow the prevention steps below until a healthcare provider or local or state health department says they can return to normal activities.  Stay home except to get medical care You should restrict activities outside your home, except for getting medical care. Do not go to work, school, or public areas, and do not use public transportation or taxis.  Call ahead before visiting your doctor Before your medical appointment, call the healthcare provider and tell them that you have, or are being evaluated for, COVID-19 infection. This will help the healthcare provider's office take steps to keep other people from getting infected. Ask your healthcare provider to call the local or state health department.  Monitor your symptoms Seek prompt medical attention if your illness is worsening (e.g., difficulty breathing). Before going to your medical appointment, call the healthcare provider and tell them that you have, or are being evaluated for, COVID-19 infection. Ask your healthcare provider to call the local or state health department.  Wear a facemask You should wear a facemask that  covers your nose and mouth when you are in the same room with other people and when you visit a healthcare provider. People who live with or visit you should also wear a facemask while they are in the same room with you.  Separate yourself from other people in your home As much as possible, you should stay in a different room from other people in your home. Also, you should use a separate bathroom, if available.  Avoid sharing household items You should not share dishes, drinking glasses, cups, eating utensils, towels, bedding, or other items with other people in your home. After using these items, you should wash them thoroughly with soap and water.  Cover your coughs and sneezes Cover your mouth and nose with a tissue when you cough or sneeze, or you can cough or sneeze into your sleeve. Throw used tissues in a lined trash can, and immediately wash your hands with soap and water for at least 20 seconds or use an alcohol-based hand rub.  Wash your [employer] your hands often and thoroughly with soap and water for at least 20 seconds. You can use an alcohol-based hand sanitizer if soap and water are not available and if your hands are not visibly dirty. Avoid touching your eyes, nose, and mouth with unwashed hands.   Prevention Steps for Caregivers and Household Members of Individuals Confirmed to have, or Being Evaluated for, COVID-19 Infection Being Cared for in the Home  If you live with, or provide care at home for, a person confirmed to have, or being evaluated for, COVID-19  infection please follow these guidelines to prevent infection:  Follow healthcare provider's instructions Make sure that you understand and can help the patient follow any healthcare provider instructions for all care.  Provide for the patient's basic needs You should help the patient with basic needs in the home and provide support for getting groceries, prescriptions, and other personal needs.  Monitor  the patient's symptoms If they are getting sicker, call his or her medical provider and tell them that the patient has, or is being evaluated for, COVID-19 infection. This will help the healthcare provider's office take steps to keep other people from getting infected. Ask the healthcare provider to call the local or state health department.  Limit the number of people who have contact with the patient  If possible, have only one caregiver for the patient.  Other household members should stay in another home or place of residence. If this is not possible, they should stay  in another room, or be separated from the patient as much as possible. Use a separate bathroom, if available.  Restrict visitors who do not have an essential need to be in the home.  Keep older adults, very young children, and other sick people away from the patient Keep older adults, very young children, and those who have compromised immune systems or chronic health conditions away from the patient. This includes people with chronic heart, lung, or kidney conditions, diabetes, and cancer.  Ensure good ventilation Make sure that shared spaces in the home have good air flow, such as from an air conditioner or an opened window, weather permitting.  Wash your hands often  Wash your hands often and thoroughly with soap and water for at least 20 seconds. You can use an alcohol based hand sanitizer if soap and water are not available and if your hands are not visibly dirty.  Avoid touching your eyes, nose, and mouth with unwashed hands.  Use disposable paper towels to dry your hands. If not available, use dedicated cloth towels and replace them when they become wet.  Wear a facemask and gloves  Wear a disposable facemask at all times in the room and gloves when you touch or have contact with the patient's blood, body fluids, and/or secretions or excretions, such as sweat, saliva, sputum, nasal mucus, vomit, urine, or  feces.  Ensure the mask fits over your nose and mouth tightly, and do not touch it during use.  Throw out disposable facemasks and gloves after using them. Do not reuse.  Wash your hands immediately after removing your facemask and gloves.  If your personal clothing becomes contaminated, carefully remove clothing and launder. Wash your hands after handling contaminated clothing.  Place all used disposable facemasks, gloves, and other waste in a lined container before disposing them with other household waste.  Remove gloves and wash your hands immediately after handling these items.  Do not share dishes, glasses, or other household items with the patient  Avoid sharing household items. You should not share dishes, drinking glasses, cups, eating utensils, towels, bedding, or other items with a patient who is confirmed to have, or being evaluated for, COVID-19 infection.  After the person uses these items, you should wash them thoroughly with soap and water.  Wash laundry thoroughly  Immediately remove and wash clothes or bedding that have blood, body fluids, and/or secretions or excretions, such as sweat, saliva, sputum, nasal mucus, vomit, urine, or feces, on them.  Wear gloves when handling laundry from the patient.  Read and follow directions on labels of laundry or clothing items and detergent. In general, wash and dry with the warmest temperatures recommended on the label.  Clean all areas the individual has used often  Clean all touchable surfaces, such as counters, tabletops, doorknobs, bathroom fixtures, toilets, phones, keyboards, tablets, and bedside tables, every day. Also, clean any surfaces that may have blood, body fluids, and/or secretions or excretions on them.  Wear gloves when cleaning surfaces the patient has come in contact with.  Use a diluted bleach solution (e.g., dilute bleach with 1 part bleach and 10 parts water) or a household disinfectant with a label that  says EPA-registered for coronaviruses. To make a bleach solution at home, add 1 tablespoon of bleach to 1 quart (4 cups) of water. For a larger supply, add  cup of bleach to 1 gallon (16 cups) of water.  Read labels of cleaning products and follow recommendations provided on product labels. Labels contain instructions for safe and effective use of the cleaning product including precautions you should take when applying the product, such as wearing gloves or eye protection and making sure you have good ventilation during use of the product.  Remove gloves and wash hands immediately after cleaning.  Monitor yourself for signs and symptoms of illness Caregivers and household members are considered close contacts, should monitor their health, and will be asked to limit movement outside of the home to the extent possible. Follow the monitoring steps for close contacts listed on the symptom monitoring form.   ? If you have additional questions, contact your local health department or call the epidemiologist on call at [phone] (available 24/7). ? This guidance is subject to change. For the most up-to-date guidance from [facility], please refer to their website: [email or web address]hu

## 2020-05-16 NOTE — ED Notes (Signed)
RN walked pt in room.  O2 actually increased from 90% resting to 95% while ambulating.  Heart rate went from 106 to 130 when he stood and maintained while standing and walking.  Pt is diaphoretic, temp is now 99.2

## 2020-05-16 NOTE — ED Notes (Signed)
Pt ambulated in room maintaining oxygen saturation of 97% on room air.

## 2020-05-16 NOTE — H&P (Signed)
History and Physical    Thomas [name] KWI:[id number] DOB: [birth year] DOA: 05/15/2020  PCP: Dettinger, Elige Radon, MD  Patient coming from: Home   Chief Complaint:  Chief Complaint  Patient presents with  . Fever     HPI:    39 year old male with past medical history of diabetes mellitus type 2, gastroesophageal reflux disease, hyperlipidemia, hypertension and obesity who presents to [facility] emergency department with complaints of fevers and weakness.  Patient explains that slightly over 1 week ago he began to develop headaches, generalized weakness and poor appetite.  Several days afterwards, the patient began to lose his taste and developed a dry nonproductive cough.  Patient presented to a local [facility] urgent care clinic on August 8 and was tested for Covid which came back positive.  In the days that followed, patient developed progressively worsening generalized myalgias and weakness.  This is associated with worsening poor appetite and dry nonproductive cough with an anosmia and ageusia.  Patient did begin to develop mild shortness of breath, worse with exertion and improved with rest as the patient symptoms worsened.  Patient also exhibited associated headaches.  Patient reports that his wife has been sick with COVID-19 at approximately the same time.  Of note, the patient has not been vaccinated.  In the past few days prior to his presentation, the patient has been experiencing frequent bouts of fever.  This is what prompted him to present to [facility] emergency department for evaluation.  Upon evaluation in the emergency department, patient has been found to exhibit substantial tachycardia and fever of 100.6 F.  Patient is also been exhibiting frequent bouts of hypoxia as low as 88%.  Chest x-ray revealed left lower lobe infiltrates.  Due to the patient's persisting tachycardia with bouts of hypoxia likely requiring oxygen supplementation and known COVID-19  diagnosis the hospitalist group has been called to assess the patient for admission to the hospital.   Review of Systems:   Review of Systems  Constitutional: Positive for fever and malaise/fatigue.  Respiratory: Positive for cough and shortness of breath.   Gastrointestinal: Positive for nausea.  Neurological: Positive for headaches.  All other systems reviewed and are negative.     Past Medical History:  Diagnosis Date  . Plantar fasciitis     History reviewed. No pertinent surgical history.   reports that he has been smoking. He has been smoking about 0.25 packs per day. He has never used smokeless tobacco. He reports current alcohol use. He reports that he does not use drugs.  Allergies  Allergen Reactions  . Amoxicillin Hives and Rash  . Penicillins Hives and Rash    Family History  Problem Relation Age of Onset  . Diabetes Father   . Kidney Stones Father   . Hypertension Father   . Heart disease Maternal Grandmother   . COPD Maternal Grandmother   . Heart disease Maternal Grandfather   . Hypertension Maternal Grandfather   . Diabetes Paternal Grandmother   . Diabetes Paternal Grandfather   . Heart disease Paternal Grandfather      Prior to Admission medications   Medication Sig Start Date End Date Taking? Authorizing Provider  lisinopril (ZESTRIL) 20 MG tablet Take 1 tablet (20 mg total) by mouth daily. 10/14/19  Yes Dettinger, Elige Radon, MD  metFORMIN (GLUCOPHAGE) 500 MG tablet Take 1 tablet (500 mg total) by mouth 2 (two) times daily with a meal. 01/27/20  Yes Dettinger, Elige Radon, MD  Multiple Vitamin (MULTIVITAMIN  WITH MINERALS) TABS tablet Take 1 tablet by mouth daily.   Yes [provider]  omeprazole (PRILOSEC) 20 MG capsule Take 1 capsule (20 mg total) by mouth daily. 01/27/20  Yes Dettinger, Elige Radon, MD  ondansetron (ZOFRAN ODT) 4 MG disintegrating tablet Take 1 tablet (4 mg total) by mouth every 8 (eight) hours as needed for nausea or  vomiting. Patient not taking: Reported on 05/16/2020 01/20/20   Farrel Gordon, PA-C    Physical Exam: Vitals:   05/16/20 0154 05/16/20 0201 05/16/20 0246 05/16/20 0304  BP: 101/73     Pulse: (!) 119  (!) 106 95  Resp: (!) 23  (!) 27 18  Temp:  99.2 F (37.3 C)    TempSrc:  Oral    SpO2: 94%  90% (!) 89%    Constitutional: Acute alert and oriented x3, no associated distress.   Skin: no rashes, no lesions, somewhat poor skin turgor noted. Eyes: Pupils are equally reactive to light.  No evidence of scleral icterus or conjunctival pallor.  ENMT: Moist mucous membranes noted.  Posterior pharynx clear of any exudate or lesions.   Neck: normal, supple, no masses, no thyromegaly.  No evidence of jugular venous distension.   Respiratory: mild bibasilar rales noted without any evidence of wheezing.  Patient is tachypneic without evidence of accessory muscle use.  Cardiovascular: Tachycardic but regular, no murmurs / rubs / gallops. No extremity edema. 2+ pedal pulses. No carotid bruits.  Chest:   Nontender without crepitus or deformity.   Back:   Nontender without crepitus or deformity. Abdomen: Abdomen is soft and nontender.  No evidence of intra-abdominal masses.  Positive bowel sounds noted in all quadrants.   Musculoskeletal: No joint deformity upper and lower extremities. Good ROM, no contractures. Normal muscle tone.  Neurologic: CN 2-12 grossly intact. Sensation intact, strength noted to be 5 out of 5 in all 4 extremities.  Patient is following all commands.  Patient is responsive to verbal stimuli.   Psychiatric: Patient presents as a normal mood with appropriate affect.  Patient seems to possess insight as to theircurrent situation.     Labs on Admission: I have personally reviewed following labs and imaging studies -   CBC: Recent Labs  Lab 05/15/20 2057  WBC 5.2  HGB 14.9  HCT 45.7  MCV 79.5*  PLT 187   Basic Metabolic Panel: Recent Labs  Lab 05/15/20 2057  NA 133*  K  3.8  CL 100  CO2 20*  GLUCOSE 147*  BUN 14  CREATININE 1.06  CALCIUM 8.8*   GFR: CrCl cannot be calculated (Unknown ideal weight.). Liver Function Tests: No results for input(s): AST, ALT, ALKPHOS, BILITOT, PROT, ALBUMIN in the last 168 hours. No results for input(s): LIPASE, AMYLASE in the last 168 hours. No results for input(s): AMMONIA in the last 168 hours. Coagulation Profile: No results for input(s): INR, PROTIME in the last 168 hours. Cardiac Enzymes: No results for input(s): CKTOTAL, CKMB, CKMBINDEX, TROPONINI in the last 168 hours. BNP (last 3 results) No results for input(s): PROBNP in the last 8760 hours. HbA1C: No results for input(s): HGBA1C in the last 72 hours. CBG: No results for input(s): GLUCAP in the last 168 hours. Lipid Profile: No results for input(s): CHOL, HDL, LDLCALC, TRIG, CHOLHDL, LDLDIRECT in the last 72 hours. Thyroid Function Tests: No results for input(s): TSH, T4TOTAL, FREET4, T3FREE, THYROIDAB in the last 72 hours. Anemia Panel: No results for input(s): VITAMINB12, FOLATE, FERRITIN, TIBC, IRON, RETICCTPCT in the last  72 hours. Urine analysis:    Component Value Date/Time   COLORURINE YELLOW 02/24/2016 0612   APPEARANCEUR CLOUDY (A) 02/24/2016 0612   LABSPEC 1.024 02/24/2016 0612   PHURINE 5.5 02/24/2016 0612   GLUCOSEU NEGATIVE 02/24/2016 0612   HGBUR LARGE (A) 02/24/2016 0612   BILIRUBINUR NEGATIVE 02/24/2016 0612   BILIRUBINUR negative 04/07/2015 1619   KETONESUR NEGATIVE 02/24/2016 0612   PROTEINUR NEGATIVE 02/24/2016 0612   UROBILINOGEN negative 04/07/2015 1619   NITRITE NEGATIVE 02/24/2016 0612   LEUKOCYTESUR NEGATIVE 02/24/2016 0612    Radiological Exams on Admission - Personally Reviewed: DG Chest Portable 1 View  Result Date: 05/15/2020 CLINICAL DATA:  COVID, fever EXAM: PORTABLE CHEST 1 VIEW COMPARISON:  None. FINDINGS: Patchy airspace opacities in the left lower lung. No pleural effusion. Normal cardiomediastinal  silhouette. No pneumothorax. IMPRESSION: Patchy airspace opacities in the left lower lung suspicious for pneumonia. Electronically Signed   By: Jasmine Pang M.D.   On: 05/15/2020 21:12    EKG: Personally reviewed.  Rhythm is sinus tachycardia with heart rate of 132 bpm.  No dynamic ST segment changes appreciated.  Assessment/Plan Principal Problem:   COVID-19 virus infection   Patient is exhibiting progressively worsening generalized malaise, weakness, poor appetite, dry cough and mild shortness of breath with observable bouts of hypoxia in the emergency department requiring supplemental oxygen.  Chest x-ray revealing left lower lobe infiltrate  Patient is known to be COVID-19 positive since 9 August.  Considering patient's transient bouts of hypoxia in the emergency department requiring supplemental oxygen will admit patient to COVID-19 unit  We will initiate remdesivir and dexamethasone.  Hydrating patient with intravenous isotonic fluids  Providing patient with bronchodilator therapy via MDI  Obtaining blood culture, D-dimer, procalcitonin, ferritin, LDH.  Active Problems:   Essential hypertension   Continue home regimen of antihypertensive therapy.    Uncontrolled type 2 diabetes mellitus with hyperglycemia, without long-term current use of insulin (HCC)   Accu-Cheks before every meal and nightly with sliding scale insulin  Holding home regimen of Metformin  Hemoglobin A1c pending.    Dehydration with hyponatremia   Clinical evidence of dehydration with hyponatremia  Hydrating patient with intravenous isotonic fluids  Monitoring sodium levels with serial chemistries.    GERD without esophagitis    Continuing home regimen of daily PPI.   Code Status:  Full code Family Communication: Wife has been updated on plan of care by the emergency department staff.  Status is: Observation  The patient remains OBS appropriate and will d/c before 2  midnights.  Dispo: The patient is from: Home              Anticipated d/c is to: Home              Anticipated d/c date is: 2 days              Patient currently is not medically stable to d/c.        Marinda Elk MD [facility] Pager [phone]  If 7PM-7AM, please contact night-coverage [email or web address] Use universal [place] password for that web site. If you do not have the password, please call the hospital operator.  05/16/2020, 3:56 AM

## 2020-05-16 NOTE — ED Notes (Signed)
Pt placed on hospital bed for comfort.  He was able to ambulate w/ no difficulties.   Pt reports he is starting to feel better.

## 2020-05-16 NOTE — Progress Notes (Signed)
Patient scheduled for a Remdesivir infusion at 12 noon on 8/17, 8/18, 8/19, 8/20.  Have patient come to [address], you will see a COVID infusion banner by the road.  Enter there and turn left.  There are marked spaces for Infusion.  Call the number on the sign or [phone] and someone will come out and bring you inside.  If someone is driving you please come to the same area and call the number and someone will come outside to get you. Thank you!

## 2020-05-16 NOTE — ED Notes (Signed)
Pt placed on 2L [place], when resting his O2 drops to 88% at times.

## 2020-05-16 NOTE — ED Notes (Signed)
Admitting provider bedside 

## 2020-05-17 ENCOUNTER — Ambulatory Visit (HOSPITAL_COMMUNITY)
Admit: 2020-05-17 | Discharge: 2020-05-17 | Disposition: A | Discharge status: Home / Self Care | Payer: BC Managed Care – PPO | Attending: Pulmonary Disease | Admitting: Pulmonary Disease

## 2020-05-17 ENCOUNTER — Encounter: Payer: Self-pay | Admitting: Family Medicine

## 2020-05-17 ENCOUNTER — Telehealth (HOSPITAL_BASED_OUTPATIENT_CLINIC_OR_DEPARTMENT_OTHER): Payer: Self-pay | Admitting: Emergency Medicine

## 2020-05-17 DIAGNOSIS — U071 COVID-19: Secondary | ICD-10-CM | POA: Insufficient documentation

## 2020-05-17 LAB — BLOOD CULTURE ID PANEL (REFLEXED) - BCID2

## 2020-05-17 MED ORDER — FAMOTIDINE IN NACL 20-0.9 MG/50ML-% IV SOLN: 20.0000 mg | Freq: Once | INTRAVENOUS | Status: DC | PRN

## 2020-05-17 MED ORDER — EPINEPHRINE 0.3 MG/0.3ML IJ SOAJ: 0.3000 mg | Freq: Once | INTRAMUSCULAR | Status: DC | PRN

## 2020-05-17 MED ORDER — DIPHENHYDRAMINE HCL 50 MG/ML IJ SOLN: 50.0000 mg | Freq: Once | INTRAMUSCULAR | Status: DC | PRN

## 2020-05-17 MED ORDER — METHYLPREDNISOLONE SODIUM SUCC 125 MG IJ SOLR: 125.0000 mg | Freq: Once | INTRAMUSCULAR | Status: DC | PRN

## 2020-05-17 MED ORDER — SODIUM CHLORIDE 0.9 % IV SOLN: INTRAVENOUS | Status: DC | PRN

## 2020-05-17 MED ORDER — SODIUM CHLORIDE 0.9 % IV SOLN
100.0000 mg | Freq: Once | INTRAVENOUS | Status: AC
  Administered 2020-05-17: 100 mg via INTRAVENOUS
  Filled 2020-05-17: qty 20

## 2020-05-17 MED ORDER — ALBUTEROL SULFATE HFA 108 (90 BASE) MCG/ACT IN AERS: 2.0000 | INHALATION_SPRAY | Freq: Once | RESPIRATORY_TRACT | Status: DC | PRN

## 2020-05-17 NOTE — Discharge Instructions (Signed)

## 2020-05-17 NOTE — Progress Notes (Signed)
Patient arrived to [facility]-infusion clinic for remdesivir. Infusion completed. VSS. Patient denied any distress. AVS reviewed. D/c'ed to home.

## 2020-05-18 ENCOUNTER — Telehealth: Payer: Self-pay | Admitting: Family Medicine

## 2020-05-18 ENCOUNTER — Ambulatory Visit (HOSPITAL_COMMUNITY)
Admit: 2020-05-18 | Discharge: 2020-05-18 | Disposition: A | Discharge status: Home / Self Care | Payer: BC Managed Care – PPO | Attending: Pulmonary Disease | Admitting: Pulmonary Disease

## 2020-05-18 DIAGNOSIS — U071 COVID-19: Secondary | ICD-10-CM | POA: Diagnosis not present

## 2020-05-18 MED ORDER — DIPHENHYDRAMINE HCL 50 MG/ML IJ SOLN: 50.0000 mg | Freq: Once | INTRAMUSCULAR | Status: DC | PRN

## 2020-05-18 MED ORDER — SODIUM CHLORIDE 0.9 % IV SOLN
100.0000 mg | Freq: Once | INTRAVENOUS | Status: AC
  Administered 2020-05-18: 100 mg via INTRAVENOUS
  Filled 2020-05-18: qty 20

## 2020-05-18 MED ORDER — EPINEPHRINE 0.3 MG/0.3ML IJ SOAJ: 0.3000 mg | Freq: Once | INTRAMUSCULAR | Status: DC | PRN

## 2020-05-18 MED ORDER — METHYLPREDNISOLONE SODIUM SUCC 125 MG IJ SOLR: 125.0000 mg | Freq: Once | INTRAMUSCULAR | Status: DC | PRN

## 2020-05-18 MED ORDER — ALBUTEROL SULFATE HFA 108 (90 BASE) MCG/ACT IN AERS: 2.0000 | INHALATION_SPRAY | Freq: Once | RESPIRATORY_TRACT | Status: DC | PRN

## 2020-05-18 MED ORDER — SODIUM CHLORIDE 0.9 % IV SOLN: INTRAVENOUS | Status: DC | PRN

## 2020-05-18 MED ORDER — FAMOTIDINE IN NACL 20-0.9 MG/50ML-% IV SOLN: 20.0000 mg | Freq: Once | INTRAVENOUS | Status: DC | PRN

## 2020-05-18 NOTE — Telephone Encounter (Signed)
Pt was having trouble viewing his MyChart message so I reviewed the instructions for Friday's appt and pt voiced understanding.

## 2020-05-18 NOTE — Discharge Instructions (Signed)
10 Things You Can Do to Manage Your COVID-19 Symptoms at Home If you have possible or confirmed COVID-19: 1. Stay home from work and school. And stay away from other public places. If you must go out, avoid using any kind of public transportation, ridesharing, or taxis. 2. Monitor your symptoms carefully. If your symptoms get worse, call your healthcare provider immediately. 3. Get rest and stay hydrated. 4. If you have a medical appointment, call the healthcare provider ahead of time and tell them that you have or may have COVID-19. 5. For medical emergencies, call 911 and notify the dispatch personnel that you have or may have COVID-19. 6. Cover your cough and sneezes with a tissue or use the inside of your elbow. 7. Wash your hands often with soap and water for at least 20 seconds or clean your hands with an alcohol-based hand sanitizer that contains at least 60% alcohol. 8. As much as possible, stay in a specific room and away from other people in your home. Also, you should use a separate bathroom, if available. If you need to be around other people in or outside of the home, wear a mask. 9. Avoid sharing personal items with other people in your household, like dishes, towels, and bedding. 10. Clean all surfaces that are touched often, like counters, tabletops, and doorknobs. Use household cleaning sprays or wipes according to the label instructions. cdc.gov/coronavirus 04/01/2019 This information is not intended to replace advice given to you by your health care provider. Make sure you discuss any questions you have with your health care provider. Document Revised: 09/03/2019 Document Reviewed: 09/03/2019 Elsevier Patient Education  2020 Elsevier Inc.  

## 2020-05-18 NOTE — Progress Notes (Signed)
  Diagnosis: COVID-19  Physician: Dr Patrick Wright  Procedure: [facility] Infusion Clinic Med: remdesivir infusion - Provided patient with remdesivir fact sheet for patients, parents and caregivers prior to infusion.  Complications: No immediate complications noted.  Discharge: Discharged home   Thomas [name] 05/18/2020  

## 2020-05-19 ENCOUNTER — Ambulatory Visit (HOSPITAL_COMMUNITY)
Admit: 2020-05-19 | Discharge: 2020-05-19 | Disposition: A | Discharge status: Home / Self Care | Payer: BC Managed Care – PPO | Attending: Pulmonary Disease | Admitting: Pulmonary Disease

## 2020-05-19 DIAGNOSIS — U071 COVID-19: Secondary | ICD-10-CM | POA: Diagnosis not present

## 2020-05-19 LAB — CULTURE, BLOOD (ROUTINE X 2): Special Requests: ADEQUATE

## 2020-05-19 MED ORDER — DIPHENHYDRAMINE HCL 50 MG/ML IJ SOLN: 50.0000 mg | Freq: Once | INTRAMUSCULAR | Status: DC | PRN

## 2020-05-19 MED ORDER — FAMOTIDINE IN NACL 20-0.9 MG/50ML-% IV SOLN: 20.0000 mg | Freq: Once | INTRAVENOUS | Status: DC | PRN

## 2020-05-19 MED ORDER — SODIUM CHLORIDE 0.9 % IV SOLN
100.0000 mg | Freq: Once | INTRAVENOUS | Status: AC
  Administered 2020-05-19: 100 mg via INTRAVENOUS
  Filled 2020-05-19: qty 20

## 2020-05-19 MED ORDER — SODIUM CHLORIDE 0.9 % IV SOLN: INTRAVENOUS | Status: DC | PRN

## 2020-05-19 MED ORDER — METHYLPREDNISOLONE SODIUM SUCC 125 MG IJ SOLR: 125.0000 mg | Freq: Once | INTRAMUSCULAR | Status: DC | PRN

## 2020-05-19 MED ORDER — SODIUM CHLORIDE 0.9 % IV SOLN: 100.0000 mg | Freq: Once | INTRAVENOUS | Status: DC

## 2020-05-19 MED ORDER — EPINEPHRINE 0.3 MG/0.3ML IJ SOAJ: 0.3000 mg | Freq: Once | INTRAMUSCULAR | Status: DC | PRN

## 2020-05-19 MED ORDER — ALBUTEROL SULFATE HFA 108 (90 BASE) MCG/ACT IN AERS: 2.0000 | INHALATION_SPRAY | Freq: Once | RESPIRATORY_TRACT | Status: DC | PRN

## 2020-05-19 NOTE — Progress Notes (Signed)
  Diagnosis: COVID-19  Physician:Dr Wright  Procedure: [facility] Infusion Clinic Med: remdesivir infusion - Provided patient with remdesivir fact sheet for patients, parents and caregivers prior to infusion.  Complications: No immediate complications noted.  Discharge: Discharged home   Porchia Sinkler Apple 05/19/2020  

## 2020-05-20 ENCOUNTER — Ambulatory Visit (INDEPENDENT_AMBULATORY_CARE_PROVIDER_SITE_OTHER): Payer: BC Managed Care – PPO | Admitting: Family Medicine

## 2020-05-20 ENCOUNTER — Other Ambulatory Visit: Payer: Self-pay

## 2020-05-20 ENCOUNTER — Encounter: Payer: Self-pay | Admitting: Family Medicine

## 2020-05-20 ENCOUNTER — Ambulatory Visit (HOSPITAL_COMMUNITY)
Admit: 2020-05-20 | Discharge: 2020-05-20 | Disposition: A | Discharge status: Home / Self Care | Payer: BC Managed Care – PPO | Attending: Pulmonary Disease | Admitting: Pulmonary Disease

## 2020-05-20 VITALS — BP 131/80 | HR 96 | Temp 98.2°F

## 2020-05-20 DIAGNOSIS — U071 COVID-19: Secondary | ICD-10-CM

## 2020-05-20 MED ORDER — METHYLPREDNISOLONE SODIUM SUCC 125 MG IJ SOLR: 125.0000 mg | Freq: Once | INTRAMUSCULAR | Status: DC | PRN

## 2020-05-20 MED ORDER — SODIUM CHLORIDE 0.9 % IV SOLN: INTRAVENOUS | Status: DC | PRN

## 2020-05-20 MED ORDER — EPINEPHRINE 0.3 MG/0.3ML IJ SOAJ: 0.3000 mg | Freq: Once | INTRAMUSCULAR | Status: DC | PRN

## 2020-05-20 MED ORDER — DIPHENHYDRAMINE HCL 50 MG/ML IJ SOLN: 50.0000 mg | Freq: Once | INTRAMUSCULAR | Status: DC | PRN

## 2020-05-20 MED ORDER — ALBUTEROL SULFATE HFA 108 (90 BASE) MCG/ACT IN AERS: 2.0000 | INHALATION_SPRAY | Freq: Once | RESPIRATORY_TRACT | Status: DC | PRN

## 2020-05-20 MED ORDER — SODIUM CHLORIDE 0.9 % IV SOLN
100.0000 mg | Freq: Once | INTRAVENOUS | Status: AC
  Administered 2020-05-20: 100 mg via INTRAVENOUS
  Filled 2020-05-20: qty 20

## 2020-05-20 MED ORDER — FAMOTIDINE IN NACL 20-0.9 MG/50ML-% IV SOLN: 20.0000 mg | Freq: Once | INTRAVENOUS | Status: DC | PRN

## 2020-05-20 NOTE — Progress Notes (Signed)
  Diagnosis: COVID-19  Physician: Dr. Patrick Wright  Procedure: [facility] Infusion Clinic Med: remdesivir infusion - Provided patient with remdesivir fact sheet for patients, parents and caregivers prior to infusion.  Complications: No immediate complications noted.  Discharge: Discharged home   Thomas [name] 05/20/2020   

## 2020-05-20 NOTE — Progress Notes (Signed)
BP 131/80   Pulse 96   Temp 98.2 F (36.8 C)   SpO2 96%    Subjective:   Patient ID: Thomas [name], male    DOB: [birth year], 39 y.o.   MRN: [id number]  HPI: Thomas [name] is a 39 y.o. male presenting on 05/20/2020 for Covid Clearance (return to work note)   HPI Patient is coming in for covid infection f/u.  He feels like he is doing a lot better, he still cough and some night sweats but has not had any more fevers or chills.  He still has a little bit of chest congestion but is greatly improved.  He did do the infusions and that has helped greatly.  He feels like he is starting to get some energy back but that it is taking some time still.  His wife was sick prior to him but she is doing a lot better.  Relevant past medical, surgical, family and social history reviewed and updated as indicated. Interim medical history since our last visit reviewed. Allergies and medications reviewed and updated.  Review of Systems  Constitutional: Positive for fatigue. Negative for chills and fever.  HENT: Positive for congestion and postnasal drip. Negative for ear discharge, ear pain, rhinorrhea, sinus pressure, sneezing, sore throat and voice change.   Eyes: Negative for pain, discharge, redness and visual disturbance.  Respiratory: Positive for cough. Negative for shortness of breath and wheezing.   Cardiovascular: Negative for chest pain and leg swelling.  Musculoskeletal: Negative for gait problem.  Skin: Negative for rash.  All other systems reviewed and are negative.   Per HPI unless specifically indicated above   Allergies as of 05/20/2020      Reactions   Amoxicillin Hives, Rash   Penicillins Hives, Rash      Medication List       Accurate as of May 20, 2020  4:43 PM. If you have any questions, ask your nurse or doctor.        dexamethasone 6 MG tablet Commonly known as: DECADRON Take 1 tablet (6 mg total) by mouth daily.   lisinopril 20 MG tablet Commonly known as:  ZESTRIL Take 1 tablet (20 mg total) by mouth daily.   metFORMIN 500 MG tablet Commonly known as: GLUCOPHAGE Take 1 tablet (500 mg total) by mouth 2 (two) times daily with a meal.   multivitamin with minerals Tabs tablet Take 1 tablet by mouth daily.   omeprazole 20 MG capsule Commonly known as: PRILOSEC Take 1 capsule (20 mg total) by mouth daily.   ondansetron 4 MG disintegrating tablet Commonly known as: Zofran ODT Take 1 tablet (4 mg total) by mouth every 8 (eight) hours as needed for nausea or vomiting.        Objective:   There were no vitals taken for this visit.  Wt Readings from Last 3 Encounters:  01/20/20 (!) 330 lb (149.7 kg)  10/14/19 (!) 352 lb 12.8 oz (160 kg)  05/05/19 (!) 344 lb (156 kg)    Physical Exam Vitals and nursing note reviewed.  Constitutional:      General: He is not in acute distress.    Appearance: He is well-developed. He is not diaphoretic.  Eyes:     General: No scleral icterus.    Conjunctiva/sclera: Conjunctivae normal.  Cardiovascular:     Rate and Rhythm: Normal rate and regular rhythm.     Heart sounds: Normal heart sounds. No murmur heard.   Pulmonary:  Effort: Pulmonary effort is normal. No respiratory distress.     Breath sounds: Normal breath sounds. No wheezing.  Musculoskeletal:        General: Normal range of motion.     Cervical back: Neck supple.  Skin:    General: Skin is warm and dry.     Findings: No rash.  Neurological:     Mental Status: He is alert and oriented to person, place, and time.     Coordination: Coordination normal.  Psychiatric:        Behavior: Behavior normal.       Assessment & Plan:   Problem List Items Addressed This Visit      Other   COVID-19 virus infection - Primary      Off work until the 13th of next month.  Follow up plan: Return if symptoms worsen or fail to improve.  Counseling provided for all of the vaccine components No orders of the defined types were placed  in this encounter.   Arville Care, MD [facility] 05/20/2020, 4:43 PM

## 2020-05-20 NOTE — Discharge Instructions (Signed)
10 Things You Can Do to Manage Your COVID-19 Symptoms at Home If you have possible or confirmed COVID-19: 1. Stay home from work and school. And stay away from other public places. If you must go out, avoid using any kind of public transportation, ridesharing, or taxis. 2. Monitor your symptoms carefully. If your symptoms get worse, call your healthcare provider immediately. 3. Get rest and stay hydrated. 4. If you have a medical appointment, call the healthcare provider ahead of time and tell them that you have or may have COVID-19. 5. For medical emergencies, call 911 and notify the dispatch personnel that you have or may have COVID-19. 6. Cover your cough and sneezes with a tissue or use the inside of your elbow. 7. Wash your hands often with soap and water for at least 20 seconds or clean your hands with an alcohol-based hand sanitizer that contains at least 60% alcohol. 8. As much as possible, stay in a specific room and away from other people in your home. Also, you should use a separate bathroom, if available. If you need to be around other people in or outside of the home, wear a mask. 9. Avoid sharing personal items with other people in your household, like dishes, towels, and bedding. 10. Clean all surfaces that are touched often, like counters, tabletops, and doorknobs. Use household cleaning sprays or wipes according to the label instructions. cdc.gov/coronavirus 04/01/2019 This information is not intended to replace advice given to you by your health care provider. Make sure you discuss any questions you have with your health care provider. Document Revised: 09/03/2019 Document Reviewed: 09/03/2019 Elsevier Patient Education  2020 Elsevier Inc.  

## 2020-05-21 LAB — CULTURE, BLOOD (ROUTINE X 2): Culture: NO GROWTH

## 2020-06-08 ENCOUNTER — Encounter: Payer: Self-pay | Admitting: Family Medicine

## 2020-06-17 ENCOUNTER — Encounter: Payer: Self-pay | Admitting: Family Medicine

## 2020-09-08 DIAGNOSIS — Z03818 Encounter for observation for suspected exposure to other biological agents ruled out: Secondary | ICD-10-CM | POA: Diagnosis not present

## 2020-12-05 ENCOUNTER — Other Ambulatory Visit: Payer: Self-pay | Admitting: Family Medicine

## 2020-12-05 DIAGNOSIS — I10 Essential (primary) hypertension: Secondary | ICD-10-CM

## 2020-12-05 DIAGNOSIS — E1169 Type 2 diabetes mellitus with other specified complication: Secondary | ICD-10-CM

## 2021-01-02 ENCOUNTER — Encounter: Payer: BC Managed Care – PPO | Admitting: Family Medicine

## 2021-03-14 ENCOUNTER — Other Ambulatory Visit: Payer: Self-pay | Admitting: Family Medicine

## 2021-03-14 DIAGNOSIS — E1169 Type 2 diabetes mellitus with other specified complication: Secondary | ICD-10-CM

## 2021-03-14 DIAGNOSIS — I10 Essential (primary) hypertension: Secondary | ICD-10-CM

## 2021-04-04 ENCOUNTER — Other Ambulatory Visit: Payer: BC Managed Care – PPO

## 2021-04-04 ENCOUNTER — Telehealth: Payer: Self-pay | Admitting: Family Medicine

## 2021-04-04 ENCOUNTER — Other Ambulatory Visit: Payer: Self-pay

## 2021-04-04 DIAGNOSIS — E1169 Type 2 diabetes mellitus with other specified complication: Secondary | ICD-10-CM | POA: Diagnosis not present

## 2021-04-04 DIAGNOSIS — E785 Hyperlipidemia, unspecified: Secondary | ICD-10-CM

## 2021-04-04 DIAGNOSIS — I10 Essential (primary) hypertension: Secondary | ICD-10-CM

## 2021-04-05 ENCOUNTER — Encounter: Payer: Self-pay | Admitting: Family Medicine

## 2021-04-05 ENCOUNTER — Ambulatory Visit (INDEPENDENT_AMBULATORY_CARE_PROVIDER_SITE_OTHER): Payer: BC Managed Care – PPO | Admitting: Family Medicine

## 2021-04-05 VITALS — BP 128/90 | HR 110 | Ht 77.0 in | Wt 330.0 lb

## 2021-04-05 DIAGNOSIS — E781 Pure hyperglyceridemia: Secondary | ICD-10-CM | POA: Diagnosis not present

## 2021-04-05 DIAGNOSIS — E1169 Type 2 diabetes mellitus with other specified complication: Secondary | ICD-10-CM | POA: Diagnosis not present

## 2021-04-05 DIAGNOSIS — E785 Hyperlipidemia, unspecified: Secondary | ICD-10-CM

## 2021-04-05 LAB — CBC WITH DIFFERENTIAL/PLATELET
Basophils Absolute: 0.1 10*3/uL (ref 0.0–0.2)
Basos: 1 %
EOS (ABSOLUTE): 0.2 10*3/uL (ref 0.0–0.4)
Eos: 2 %
Hematocrit: 46.9 % (ref 37.5–51.0)
Hemoglobin: 16.2 g/dL (ref 13.0–17.7)
Immature Grans (Abs): 0 10*3/uL (ref 0.0–0.1)
Immature Granulocytes: 0 %
Lymphocytes Absolute: 3.1 10*3/uL (ref 0.7–3.1)
Lymphs: 42 %
MCH: 27 pg (ref 26.6–33.0)
MCHC: 34.5 g/dL (ref 31.5–35.7)
MCV: 78 fL — ABNORMAL LOW (ref 79–97)
Monocytes Absolute: 0.4 10*3/uL (ref 0.1–0.9)
Monocytes: 6 %
Neutrophils Absolute: 3.7 10*3/uL (ref 1.4–7.0)
Neutrophils: 49 %
Platelets: 298 10*3/uL (ref 150–450)
RBC: 5.99 x10E6/uL — ABNORMAL HIGH (ref 4.14–5.80)
RDW: 14.3 % (ref 11.6–15.4)
WBC: 7.4 10*3/uL (ref 3.4–10.8)

## 2021-04-05 LAB — CMP14+EGFR
ALT: 24 IU/L (ref 0–44)
AST: 16 IU/L (ref 0–40)
Albumin/Globulin Ratio: 1.6 (ref 1.2–2.2)
Albumin: 4.1 g/dL (ref 4.0–5.0)
Alkaline Phosphatase: 95 IU/L (ref 44–121)
BUN/Creatinine Ratio: 17 (ref 9–20)
BUN: 15 mg/dL (ref 6–20)
Bilirubin Total: 0.3 mg/dL (ref 0.0–1.2)
CO2: 22 mmol/L (ref 20–29)
Calcium: 9.3 mg/dL (ref 8.7–10.2)
Chloride: 98 mmol/L (ref 96–106)
Creatinine, Ser: 0.9 mg/dL (ref 0.76–1.27)
Globulin, Total: 2.6 g/dL (ref 1.5–4.5)
Glucose: 201 mg/dL — ABNORMAL HIGH (ref 65–99)
Potassium: 4.5 mmol/L (ref 3.5–5.2)
Sodium: 136 mmol/L (ref 134–144)
Total Protein: 6.7 g/dL (ref 6.0–8.5)
eGFR: 112 mL/min/{1.73_m2} (ref >59)

## 2021-04-05 LAB — LIPID PANEL
Chol/HDL Ratio: 16.8 ratio — ABNORMAL HIGH (ref 0.0–5.0)
Cholesterol, Total: 319 mg/dL — ABNORMAL HIGH (ref 100–199)
HDL: 19 mg/dL — ABNORMAL LOW (ref >39)
Triglycerides: 2131 mg/dL (ref 0–149)

## 2021-04-05 MED ORDER — OZEMPIC (0.25 OR 0.5 MG/DOSE) 2 MG/1.5ML ~~LOC~~ SOPN: 0.5000 mg | PEN_INJECTOR | SUBCUTANEOUS | 3 refills | Status: DC

## 2021-04-05 MED ORDER — LOSARTAN POTASSIUM 50 MG PO TABS: 50.0000 mg | ORAL_TABLET | Freq: Every day | ORAL | 3 refills | Status: DC

## 2021-04-05 MED ORDER — FENOFIBRATE 145 MG PO TABS: 145.0000 mg | ORAL_TABLET | Freq: Every day | ORAL | 3 refills | Status: DC

## 2021-04-05 NOTE — Progress Notes (Signed)
BP 128/90   Pulse (!) 110   Ht 6' 5" (1.956 m)   Wt (!) 330 lb (149.7 kg)   SpO2 96%   BMI 39.13 kg/m    Subjective:   Patient ID: Thomas [name], male    DOB: 1980/12/29, 40 y.o.   MRN: [id number]  HPI: Thomas [name] is a 40 y.o. male presenting on 04/05/2021 for Medical Management of Chronic Issues, Diabetes, and Hypertension   HPI Type 2 diabetes mellitus Patient comes in today for recheck of his diabetes. Patient has been currently taking metformin 500 twice a day although he does admit he has missed some of the evening doses.  A1c is pending still. Patient is currently on an ACE inhibitor/ARB. Patient has not seen an ophthalmologist this year. Patient denies any issues with their feet. The symptom started onset as an adult htn and cholesterol ARE RELATED TO DM   Hypertension Patient is currently on lisinopril, says he is having a cough every day, its dry., and their blood pressure today is 128/90. Patient denies any lightheadedness or dizziness. Patient denies headaches, blurred vision, chest pains, shortness of breath, or weakness. Denies any side effects from medication and is content with current medication.   Hyperlipidemia and hypertriglyceridemia Patient is coming in for recheck of his hyperlipidemia. The patient is currently taking no medication and cholesterol is through the roof with the triglycerides over thousand.. They deny any issues with myalgias or history of liver damage from it. They deny any focal numbness or weakness or chest pain.   Patient says he is having some erectile dysfunction issues, he does want to have a child but he is not able to have erections all the time or sustain erections.  We discussed that his blood flow and blood work is likely an issue with this.  Relevant past medical, surgical, family and social history reviewed and updated as indicated. Interim medical history since our last visit reviewed. Allergies and medications reviewed and  updated.  Review of Systems  Constitutional:  Negative for chills and fever.  Eyes:  Negative for visual disturbance.  Respiratory:  Negative for shortness of breath and wheezing.   Cardiovascular:  Negative for chest pain and leg swelling.  Musculoskeletal:  Negative for back pain and gait problem.  Skin:  Negative for rash.  All other systems reviewed and are negative.  Per HPI unless specifically indicated above   Allergies as of 04/05/2021       Reactions   Amoxicillin Hives, Rash   Penicillins Hives, Rash        Medication List        Accurate as of April 05, 2021  2:55 PM. If you have any questions, ask your nurse or doctor.          STOP taking these medications    dexamethasone 6 MG tablet Commonly known as: DECADRON Stopped by: Fransisca Kaufmann Antwanette Wesche, MD   lisinopril 20 MG tablet Commonly known as: ZESTRIL Stopped by: Fransisca Kaufmann Digby Groeneveld, MD       TAKE these medications    fenofibrate 145 MG tablet Commonly known as: TRICOR Take 1 tablet (145 mg total) by mouth daily. Started by: Worthy Rancher, MD   losartan 50 MG tablet Commonly known as: COZAAR Take 1 tablet (50 mg total) by mouth daily. Started by: Worthy Rancher, MD   metFORMIN 500 MG tablet Commonly known as: GLUCOPHAGE Take 1 tablet (500 mg total) by mouth 2 (two) times daily  with a meal.   multivitamin with minerals Tabs tablet Take 1 tablet by mouth daily.   omeprazole 20 MG capsule Commonly known as: PRILOSEC Take 1 capsule (20 mg total) by mouth daily.   ondansetron 4 MG disintegrating tablet Commonly known as: Zofran ODT Take 1 tablet (4 mg total) by mouth every 8 (eight) hours as needed for nausea or vomiting.   Ozempic (0.25 or 0.5 MG/DOSE) 2 MG/1.5ML Sopn Generic drug: Semaglutide(0.25 or 0.5MG/DOS) Inject 0.5 mg into the skin once a week. Started by: Fransisca Kaufmann Sarabelle Genson, MD         Objective:   BP 128/90   Pulse (!) 110   Ht 6' 5" (1.956 m)   Wt (!) 330 lb  (149.7 kg)   SpO2 96%   BMI 39.13 kg/m   Wt Readings from Last 3 Encounters:  04/05/21 (!) 330 lb (149.7 kg)  01/20/20 (!) 330 lb (149.7 kg)  10/14/19 (!) 352 lb 12.8 oz (160 kg)    Physical Exam Vitals and nursing note reviewed.  Constitutional:      General: He is not in acute distress.    Appearance: He is well-developed. He is not diaphoretic.  Eyes:     General: No scleral icterus. Neck:     Thyroid: No thyromegaly.  Cardiovascular:     Rate and Rhythm: Normal rate and regular rhythm.     Heart sounds: Normal heart sounds. No murmur heard. Pulmonary:     Effort: Pulmonary effort is normal. No respiratory distress.     Breath sounds: Normal breath sounds. No wheezing.  Musculoskeletal:        General: Normal range of motion.     Cervical back: Neck supple.  Lymphadenopathy:     Cervical: No cervical adenopathy.  Skin:    General: Skin is warm and dry.     Findings: No rash.  Neurological:     Mental Status: He is alert and oriented to person, place, and time.     Coordination: Coordination normal.  Psychiatric:        Behavior: Behavior normal.    Results for orders placed or performed in visit on 04/04/21  Lipid panel  Result Value Ref Range   Cholesterol, Total 319 (H) 100 - 199 mg/dL   Triglycerides 2,131 (HH) 0 - 149 mg/dL   HDL 19 (L) >39 mg/dL   VLDL Cholesterol Cal Comment (A) 5 - 40 mg/dL   LDL Chol Calc (NIH) Comment (A) 0 - 99 mg/dL   Chol/HDL Ratio 16.8 (H) 0.0 - 5.0 ratio  CMP14+EGFR  Result Value Ref Range   Glucose 201 (H) 65 - 99 mg/dL   BUN 15 6 - 20 mg/dL   Creatinine, Ser 0.90 0.76 - 1.27 mg/dL   eGFR 112 >59 mL/min/1.73   BUN/Creatinine Ratio 17 9 - 20   Sodium 136 134 - 144 mmol/L   Potassium 4.5 3.5 - 5.2 mmol/L   Chloride 98 96 - 106 mmol/L   CO2 22 20 - 29 mmol/L   Calcium 9.3 8.7 - 10.2 mg/dL   Total Protein 6.7 6.0 - 8.5 g/dL   Albumin 4.1 4.0 - 5.0 g/dL   Globulin, Total 2.6 1.5 - 4.5 g/dL   Albumin/Globulin Ratio 1.6 1.2 -  2.2   Bilirubin Total 0.3 0.0 - 1.2 mg/dL   Alkaline Phosphatase 95 44 - 121 IU/L   AST 16 0 - 40 IU/L   ALT 24 0 - 44 IU/L  CBC with Differential/Platelet  Result Value Ref Range   WBC 7.4 3.4 - 10.8 x10E3/uL   RBC 5.99 (H) 4.14 - 5.80 x10E6/uL   Hemoglobin 16.2 13.0 - 17.7 g/dL   Hematocrit 46.9 37.5 - 51.0 %   MCV 78 (L) 79 - 97 fL   MCH 27.0 26.6 - 33.0 pg   MCHC 34.5 31.5 - 35.7 g/dL   RDW 14.3 11.6 - 15.4 %   Platelets 298 150 - 450 x10E3/uL   Neutrophils 49 Not Estab. %   Lymphs 42 Not Estab. %   Monocytes 6 Not Estab. %   Eos 2 Not Estab. %   Basos 1 Not Estab. %   Neutrophils Absolute 3.7 1.4 - 7.0 x10E3/uL   Lymphocytes Absolute 3.1 0.7 - 3.1 x10E3/uL   Monocytes Absolute 0.4 0.1 - 0.9 x10E3/uL   EOS (ABSOLUTE) 0.2 0.0 - 0.4 x10E3/uL   Basophils Absolute 0.1 0.0 - 0.2 x10E3/uL   Immature Granulocytes 0 Not Estab. %   Immature Grans (Abs) 0.0 0.0 - 0.1 x10E3/uL    Assessment & Plan:   Problem List Items Addressed This Visit       Endocrine   Type 2 diabetes mellitus with other specified complication (HCC) - Primary   Relevant Medications   Semaglutide,0.25 or 0.5MG/DOS, (OZEMPIC, 0.25 OR 0.5 MG/DOSE,) 2 MG/1.5ML SOPN   losartan (COZAAR) 50 MG tablet   Hyperlipidemia associated with type 2 diabetes mellitus (HCC)   Relevant Medications   fenofibrate (TRICOR) 145 MG tablet   Semaglutide,0.25 or 0.5MG/DOS, (OZEMPIC, 0.25 OR 0.5 MG/DOSE,) 2 MG/1.5ML SOPN   losartan (COZAAR) 50 MG tablet   Other Visit Diagnoses     Hypertriglyceridemia       Relevant Medications   fenofibrate (TRICOR) 145 MG tablet   losartan (COZAAR) 50 MG tablet       Will start Ozempic for his sugars at 0.5.  Warned about the stomach issues.  We will start fenofibrate as well for his cholesterol.  Switch from lisinopril to losartan because of cough  Follow up plan: Return in about 3 months (around 07/06/2021), or if symptoms worsen or fail to improve, for Diabetes and cholesterol  recheck.  Counseling provided for all of the vaccine components No orders of the defined types were placed in this encounter.   Caryl Pina, MD [facility] 04/05/2021, 2:55 PM

## 2021-04-06 ENCOUNTER — Encounter: Payer: Self-pay | Admitting: Family Medicine

## 2021-04-06 LAB — SPECIMEN STATUS REPORT

## 2021-04-06 LAB — HGB A1C W/O EAG: Hgb A1c MFr Bld: 10.8 % — ABNORMAL HIGH (ref 4.8–5.6)

## 2021-05-15 DIAGNOSIS — B078 Other viral warts: Secondary | ICD-10-CM | POA: Diagnosis not present

## 2021-05-15 DIAGNOSIS — L918 Other hypertrophic disorders of the skin: Secondary | ICD-10-CM | POA: Diagnosis not present

## 2021-05-15 DIAGNOSIS — D485 Neoplasm of uncertain behavior of skin: Secondary | ICD-10-CM | POA: Diagnosis not present

## 2021-05-15 DIAGNOSIS — D2262 Melanocytic nevi of left upper limb, including shoulder: Secondary | ICD-10-CM | POA: Diagnosis not present

## 2021-05-15 DIAGNOSIS — D225 Melanocytic nevi of trunk: Secondary | ICD-10-CM | POA: Diagnosis not present

## 2021-05-15 DIAGNOSIS — D2261 Melanocytic nevi of right upper limb, including shoulder: Secondary | ICD-10-CM | POA: Diagnosis not present

## 2021-06-14 ENCOUNTER — Other Ambulatory Visit: Payer: Self-pay | Admitting: Family Medicine

## 2021-06-14 DIAGNOSIS — K219 Gastro-esophageal reflux disease without esophagitis: Secondary | ICD-10-CM

## 2021-07-06 ENCOUNTER — Ambulatory Visit: Payer: BC Managed Care – PPO | Admitting: Family Medicine

## 2021-07-18 ENCOUNTER — Encounter: Payer: Self-pay | Admitting: Family Medicine

## 2021-07-18 ENCOUNTER — Ambulatory Visit: Payer: BC Managed Care – PPO | Admitting: Family Medicine

## 2021-07-18 DIAGNOSIS — J069 Acute upper respiratory infection, unspecified: Secondary | ICD-10-CM

## 2021-07-18 NOTE — Progress Notes (Signed)
Virtual Visit via telephone Note Due to COVID-19 pandemic this visit was conducted virtually. This visit type was conducted due to national recommendations for restrictions regarding the COVID-19 Pandemic (e.g. social distancing, sheltering in place) in an effort to limit this patient's exposure and mitigate transmission in our community. All issues noted in this document were discussed and addressed.  A physical exam was not performed with this format.   I connected with Thomas [name] on 07/18/2021 at 0935 by telephone and verified that I am speaking with the correct person using two identifiers. Thomas [name] is currently located at home and  no one  is currently with them during visit. The provider, Kari Baars, FNP is located in their office at time of visit.  I discussed the limitations, risks, security and privacy concerns of performing an evaluation and management service by telephone and the availability of in person appointments. I also discussed with the patient that there may be a patient responsible charge related to this service. The patient expressed understanding and agreed to proceed.  Subjective:  Patient ID: Thomas [name], male    DOB: 1980-11-21, 40 y.o.   MRN: [id number]  Chief Complaint:  URI   HPI: Thomas [name] is a 40 y.o. male presenting on 07/18/2021 for URI   Pt reports cough, congestion, mucus production, and irritated throat for 3 days. He has been taking Mucinex with some relief of the congestion. No reported fever or sick contacts.   URI  This is a new problem. The current episode started in the past 7 days. The problem has been waxing and waning. There has been no fever. Associated symptoms include congestion, coughing, headaches, rhinorrhea and a sore throat. Pertinent negatives include no abdominal pain, chest pain, diarrhea, dysuria, ear pain, joint pain, joint swelling, nausea, neck pain, plugged ear sensation, rash, sinus pain, sneezing, swollen  glands, vomiting or wheezing. He has tried decongestant for the symptoms. The treatment provided mild relief.    Relevant past medical, surgical, family, and social history reviewed and updated as indicated.  Allergies and medications reviewed and updated.   Past Medical History:  Diagnosis Date   Plantar fasciitis     History reviewed. No pertinent surgical history.  Social History   Socioeconomic History   Marital status: Married    Spouse name: Not on file   Number of children: Not on file   Years of education: Not on file   Highest education level: Not on file  Occupational History   Not on file  Tobacco Use   Smoking status: Every Day    Packs/day: 0.25    Types: Cigarettes   Smokeless tobacco: Never  Vaping Use   Vaping Use: Never used  Substance and Sexual Activity   Alcohol use: Yes    Comment: occasional   Drug use: No   Sexual activity: Not on file  Other Topics Concern   Not on file  Social History Narrative   Not on file   Social Determinants of Health   Financial Resource Strain: Not on file  Food Insecurity: Not on file  Transportation Needs: Not on file  Physical Activity: Not on file  Stress: Not on file  Social Connections: Not on file  Intimate Partner Violence: Not on file    Outpatient Encounter Medications as of 07/18/2021  Medication Sig   fenofibrate (TRICOR) 145 MG tablet Take 1 tablet (145 mg total) by mouth daily.   losartan (COZAAR) 50 MG tablet Take  1 tablet (50 mg total) by mouth daily.   metFORMIN (GLUCOPHAGE) 500 MG tablet Take 1 tablet (500 mg total) by mouth 2 (two) times daily with a meal.   Multiple Vitamin (MULTIVITAMIN WITH MINERALS) TABS tablet Take 1 tablet by mouth daily.   omeprazole (PRILOSEC) 20 MG capsule TAKE 1 CAPSULE BY MOUTH EVERY DAY   ondansetron (ZOFRAN ODT) 4 MG disintegrating tablet Take 1 tablet (4 mg total) by mouth every 8 (eight) hours as needed for nausea or vomiting.   Semaglutide,0.25 or 0.5MG /DOS,  (OZEMPIC, 0.25 OR 0.5 MG/DOSE,) 2 MG/1.5ML SOPN Inject 0.5 mg into the skin once a week.   No facility-administered encounter medications on file as of 07/18/2021.    Allergies  Allergen Reactions   Amoxicillin Hives and Rash   Penicillins Hives and Rash    Review of Systems  Constitutional:  Positive for activity change and appetite change. Negative for chills, diaphoresis, fatigue, fever and unexpected weight change.  HENT:  Positive for congestion, postnasal drip, rhinorrhea and sore throat. Negative for dental problem, drooling, ear discharge, ear pain, facial swelling, hearing loss, mouth sores, nosebleeds, sinus pressure, sinus pain, sneezing, tinnitus, trouble swallowing and voice change.   Respiratory:  Positive for cough. Negative for chest tightness, shortness of breath and wheezing.   Cardiovascular:  Negative for chest pain, palpitations and leg swelling.  Gastrointestinal:  Negative for abdominal pain, diarrhea, nausea and vomiting.  Genitourinary:  Negative for decreased urine volume, difficulty urinating and dysuria.  Musculoskeletal:  Negative for arthralgias, back pain, gait problem, joint pain, joint swelling, myalgias, neck pain and neck stiffness.  Skin:  Negative for rash.  Neurological:  Positive for headaches. Negative for weakness.  Psychiatric/Behavioral:  Negative for confusion.   All other systems reviewed and are negative.       Observations/Objective: No vital signs or physical exam, this was a telephone or virtual health encounter.  Pt alert and oriented, answers all questions appropriately, and able to speak in full sentences.    Assessment and Plan: [name] was seen today for uri.  Diagnoses and all orders for this visit:  URI with cough and congestion Symptoms consistent with viral URI. Will test for COVID-19 and influenza. Will initiate antiviral therapy if warranted. Symptomatic care discussed in detail. Pt requesting antibiotics today. Pt  educated on proper use of antibiotics. Pt aware to report any new, worsening, or persistent symptoms.  -     Veritor Flu A/B Waived -     Novel Coronavirus, NAA ([facility])     Follow Up Instructions: Return if symptoms worsen or fail to improve.    I discussed the assessment and treatment plan with the patient. The patient was provided an opportunity to ask questions and all were answered. The patient agreed with the plan and demonstrated an understanding of the instructions.   The patient was advised to call back or seek an in-person evaluation if the symptoms worsen or if the condition fails to improve as anticipated.  The above assessment and management plan was discussed with the patient. The patient verbalized understanding of and has agreed to the management plan. Patient is aware to call the clinic if they develop any new symptoms or if symptoms persist or worsen. Patient is aware when to return to the clinic for a follow-up visit. Patient educated on when it is appropriate to go to the emergency department.    I provided 12 minutes of non-face-to-face time during this encounter. The call started at 0935. The  call ended at 0945. The other time was used for coordination of care.    Kari Baars, FNP-C [facility] [address] [place], [place] [address] [phone] 07/18/2021

## 2021-09-11 ENCOUNTER — Other Ambulatory Visit: Payer: Self-pay | Admitting: Family Medicine

## 2021-09-11 DIAGNOSIS — K219 Gastro-esophageal reflux disease without esophagitis: Secondary | ICD-10-CM

## 2021-10-20 ENCOUNTER — Other Ambulatory Visit: Payer: Self-pay | Admitting: Family Medicine

## 2021-10-20 DIAGNOSIS — K219 Gastro-esophageal reflux disease without esophagitis: Secondary | ICD-10-CM

## 2021-10-20 NOTE — Telephone Encounter (Signed)
Dettinger. NTBS 30 days given 09/11/21

## 2021-10-23 ENCOUNTER — Encounter: Payer: Self-pay | Admitting: Family Medicine

## 2021-10-23 NOTE — Telephone Encounter (Signed)
Made first available appt with dr dettinger in feb. Left message on pt voicemail and will send letter

## 2021-11-06 ENCOUNTER — Ambulatory Visit: Payer: BC Managed Care – PPO | Admitting: Family Medicine

## 2021-11-23 ENCOUNTER — Other Ambulatory Visit: Payer: Self-pay | Admitting: Family Medicine

## 2021-11-23 DIAGNOSIS — K219 Gastro-esophageal reflux disease without esophagitis: Secondary | ICD-10-CM

## 2021-12-03 DIAGNOSIS — Z20822 Contact with and (suspected) exposure to covid-19: Secondary | ICD-10-CM | POA: Diagnosis not present

## 2022-01-15 ENCOUNTER — Ambulatory Visit: Payer: BC Managed Care – PPO | Admitting: Family Medicine

## 2022-01-15 ENCOUNTER — Encounter: Payer: Self-pay | Admitting: Family Medicine

## 2022-01-15 VITALS — BP 122/89 | HR 117 | Temp 97.2°F | Ht 77.0 in | Wt 322.6 lb

## 2022-01-15 DIAGNOSIS — K529 Noninfective gastroenteritis and colitis, unspecified: Secondary | ICD-10-CM

## 2022-01-15 MED ORDER — PROMETHAZINE HCL 25 MG RE SUPP: 25.0000 mg | Freq: Four times a day (QID) | RECTAL | 0 refills | Status: DC | PRN

## 2022-01-15 NOTE — Progress Notes (Signed)
? ?Subjective: ?CC: nausea and vomiting ?PCP: Dettinger, Fransisca Kaufmann, MD ?[id number] Thomas [name] is a 41 y.o. male who is accompanied to today's visit by his wife.  He is presenting to clinic today for: ? ?1.  Nausea and vomiting ?Patient reports onset of nausea, vomiting Wednesday.  He vomited several times.  Had a fever to 100.2 ?F.  This defervesced with Tylenol.  He later developed some diarrhea.  He denies any blood in either stool or vomit.  He continues to have frequent stools about every hour.  He is able to hydrate without difficulty.  No abdominal pain.  He thought perhaps he may be ate some undercooked chicken last week but his wife ate the same thing and she did not get sick.  He has missed several days of work.  He is on a PPI for GERD.  No longer on Ozempic.  Had a deer tick bite but does not report any other systemic symptoms to suggest Lyme disease etc.  Tried to take a Zofran but he vomited up about 1 hour later.  Has not utilize any medication since that time ? ? ?ROS: Per HPI ? ?Allergies  ?Allergen Reactions  ? Amoxicillin Hives and Rash  ? Penicillins Hives and Rash  ? ?Past Medical History:  ?Diagnosis Date  ? Plantar fasciitis   ? ? ?Current Outpatient Medications:  ?  fenofibrate (TRICOR) 145 MG tablet, Take 1 tablet (145 mg total) by mouth daily., Disp: 90 tablet, Rfl: 3 ?  losartan (COZAAR) 50 MG tablet, Take 1 tablet (50 mg total) by mouth daily., Disp: 90 tablet, Rfl: 3 ?  metFORMIN (GLUCOPHAGE) 500 MG tablet, Take 1 tablet (500 mg total) by mouth 2 (two) times daily with a meal., Disp: 180 tablet, Rfl: 3 ?  Multiple Vitamin (MULTIVITAMIN WITH MINERALS) TABS tablet, Take 1 tablet by mouth daily., Disp: , Rfl:  ?  omeprazole (PRILOSEC) 20 MG capsule, Take 1 capsule (20 mg total) by mouth daily. (NEEDS TO BE SEEN BEFORE NEXT REFILL), Disp: 30 capsule, Rfl: 0 ?  ondansetron (ZOFRAN ODT) 4 MG disintegrating tablet, Take 1 tablet (4 mg total) by mouth every 8 (eight) hours as needed for nausea or  vomiting., Disp: 10 tablet, Rfl: 0 ?Social History  ? ?Socioeconomic History  ? Marital status: Married  ?  Spouse name: Not on file  ? Number of children: Not on file  ? Years of education: Not on file  ? Highest education level: Not on file  ?Occupational History  ? Not on file  ?Tobacco Use  ? Smoking status: Every Day  ?  Packs/day: 0.25  ?  Types: Cigarettes  ? Smokeless tobacco: Never  ?Vaping Use  ? Vaping Use: Never used  ?Substance and Sexual Activity  ? Alcohol use: Yes  ?  Comment: occasional  ? Drug use: No  ? Sexual activity: Not on file  ?Other Topics Concern  ? Not on file  ?Social History Narrative  ? Not on file  ? ?Social Determinants of Health  ? ?Financial Resource Strain: Not on file  ?Food Insecurity: Not on file  ?Transportation Needs: Not on file  ?Physical Activity: Not on file  ?Stress: Not on file  ?Social Connections: Not on file  ?Intimate Partner Violence: Not on file  ? ?Family History  ?Problem Relation Age of Onset  ? Diabetes Father   ? Kidney Stones Father   ? Hypertension Father   ? Heart disease Maternal Grandmother   ? COPD Maternal  Grandmother   ? Heart disease Maternal Grandfather   ? Hypertension Maternal Grandfather   ? Diabetes Paternal Grandmother   ? Diabetes Paternal Grandfather   ? Heart disease Paternal Grandfather   ? ? ?Objective: ?Office vital signs reviewed. ?BP 122/89   Pulse (!) 117   Temp (!) 97.2 ?F (36.2 ?C)   Ht 6\' 5"  (1.956 m)   Wt (!) 322 lb 9.6 oz (146.3 kg)   SpO2 97%   BMI 38.25 kg/m?  ? ?Physical Examination:  ?General: Awake, alert, well nourished, well appearing male. No acute distress ?HEENT: Sclera anicteric.  Moist mucous membranes ?GI: soft, non-tender, non-distended, bowel sounds present x4, no hepatomegaly, no splenomegaly, no masses ? ?Assessment/ Plan: ?41 y.o. male  ? ?Gastroenteritis - Plan: promethazine (PHENERGAN) 25 MG suppository ? ?Uncertain etiology of his nausea and vomiting.  Most certainly could be reflective of a food  poisoning but I find it somewhat confounding that his wife would not be sick with similar.  Alternative differentials include viral gastroenteritis.  Regardless, he appears to be well-hydrated albeit slightly tachycardic (this is apparently his baseline however).  Encourage p.o. hydration.  I have given him promethazine suppositories to have on hand if he has refractory nausea and vomiting and cannot tolerate his p.o. Zofran.  We discussed red flag signs and symptoms warranting further evaluation.  Work note provided.  He will follow-up as needed ? ?No orders of the defined types were placed in this encounter. ? ?No orders of the defined types were placed in this encounter. ? ? ? ?Janora Norlander, DO ?[place] ?[phone] ? ?

## 2022-01-15 NOTE — Patient Instructions (Signed)
Probiotic would be a good idea for the diarrhea. ? ?Food Choices to Help Relieve Diarrhea, Adult ?Diarrhea can make you feel weak and cause you to become dehydrated. It is important to choose the right foods and drinks to: ?Relieve diarrhea. ?Replace lost fluids and nutrients. ?Prevent dehydration. ?What are tips for following this plan? ?Relieving diarrhea ?Avoid foods that make your diarrhea worse. These may include: ?Foods and beverages sweetened with high-fructose corn syrup, honey, or sweeteners such as xylitol, sorbitol, and mannitol. ?Fried, greasy, or spicy foods. ?Raw fruits and vegetables. ?Eat foods that are rich in probiotics. These include foods such as yogurt and fermented milk products. Probiotics can help increase healthy bacteria in your stomach and intestines (gastrointestinal tract or GI tract). This may help digestion and stop diarrhea. ?If you have lactose intolerance, avoid dairy products. These may make your diarrhea worse. ?Take medicine to help stop diarrhea only as told by your health care provider. ?Replacing nutrients ? ?Eat bland, easy-to-digest foods in small amounts as you are able, until your diarrhea starts to get better. These foods include bananas, applesauce, rice, toast, and crackers. ?Gradually reintroduce nutrient-rich foods as tolerated or as told by your health care provider. This includes: ?Well-cooked protein foods, such as eggs, lean meats like fish or chicken without skin, and tofu. ?Peeled, seeded, and soft-cooked fruits and vegetables. ?Low-fat dairy products. ?Whole grains. ?Take vitamin and mineral supplements as told by your health care provider. ?Preventing dehydration ? ?Start by sipping water or a solution to prevent dehydration (oral rehydration solution, ORS). This is a drink that helps replace fluids and minerals your body has lost. You can buy an ORS at pharmacies and retail stores. ?Try to drink at least 8-10 cups (2,000-2,500 mL) of fluid each day to help  replace lost fluids. If you have urine that is pale yellow, you are getting enough fluids. ?You may drink other liquids in addition to water, such as fruit juice that you have added water to (diluted fruit juice) or low-calorie sports drinks, as tolerated or as told by your health care provider. ?Avoid drinks with caffeine, such as coffee, tea, or soft drinks. ?Avoid alcohol. ?Summary ?When you have diarrhea, it is important to choose the right foods and drinks to relieve diarrhea, to replace lost fluids and nutrients, and to prevent dehydration. ?Make sure you drink enough fluid to keep your urine pale yellow. ?You may benefit from eating bland foods at first. Gradually reintroduce healthy, nutrient-rich foods as tolerated or as told by your health care provider. ?Avoid foods that make your diarrhea worse, such as fried, greasy, or spicy foods. ?This information is not intended to replace advice given to you by your health care provider. Make sure you discuss any questions you have with your health care provider. ?Document Revised: 11/03/2019 Document Reviewed: 11/03/2019 ?Elsevier Patient Education ? 2023 Elsevier Inc. ? ?

## 2022-03-07 IMAGING — CT CT ABD-PELV W/ CM
2 of 4 series · 16 of 46 positions shown, 18 images · IV contrast (Omnipaque or Isovue)
Comparison: CT abdomen pelvis dated 02/24/2016.

CLINICAL DATA: 39-year-old male with generalized abdominal pain and
vomiting.

EXAM:
CT ABDOMEN AND PELVIS WITH CONTRAST
TECHNIQUE: Multidetector CT imaging of the abdomen and pelvis was performed
using the standard protocol following bolus administration of
intravenous contrast.
CONTRAST:  100mL OMNIPAQUE IOHEXOL 300 MG/ML  SOLN

[Series 2: axial st · axial · 0.98mm/px · z∈[+809,+1314]mm · 13 of 111 slices shown, 15 images]
[im 5/111  soft-tissue]
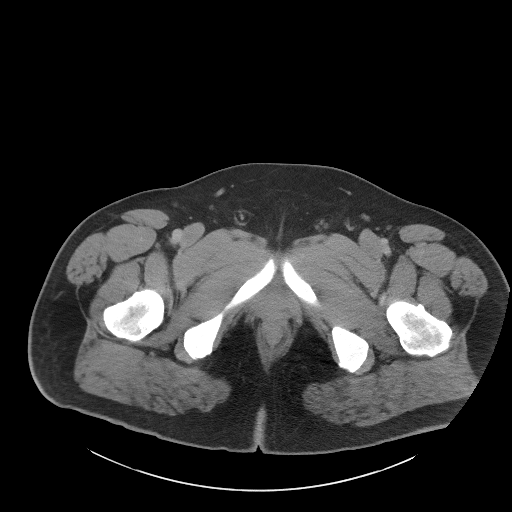
[im 5/111  bone]
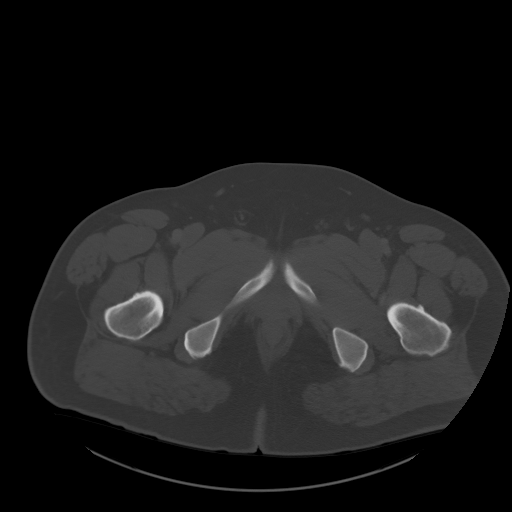
[im 15/111  soft-tissue]
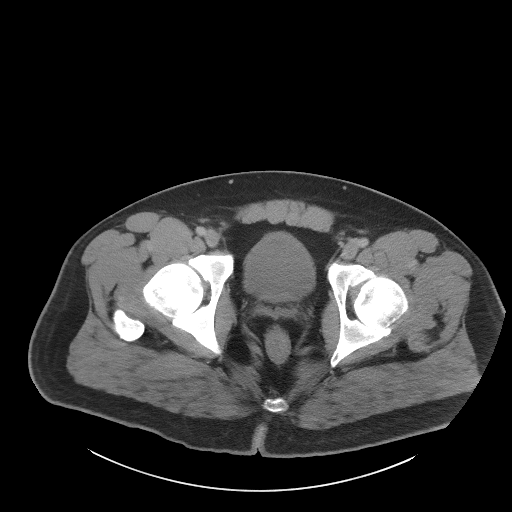
[im 24/111  soft-tissue]
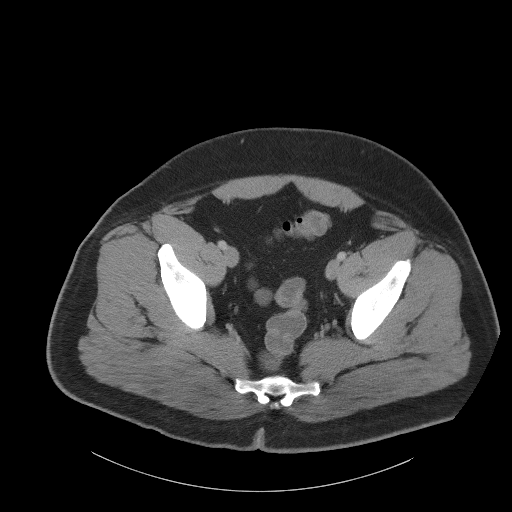
[im 29/111  soft-tissue]
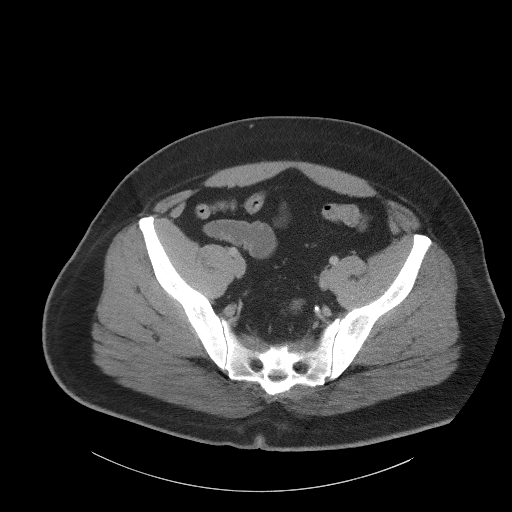
[im 39/111  soft-tissue]
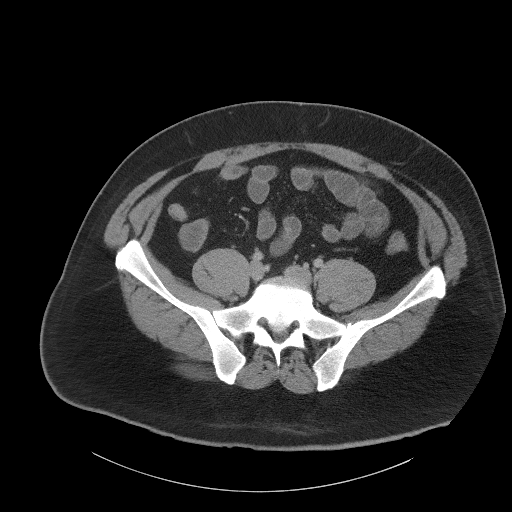
[im 48/111  soft-tissue]
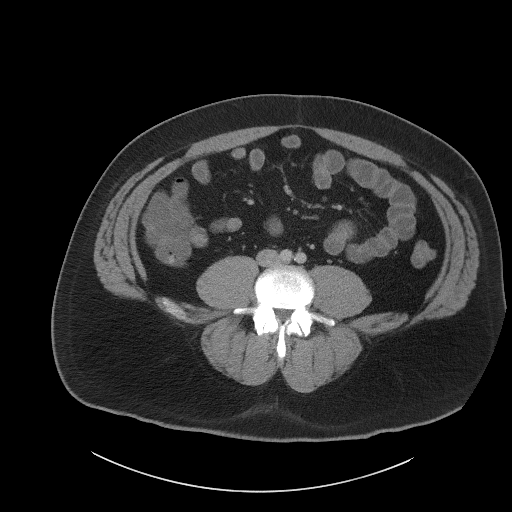
[im 58/111  soft-tissue]
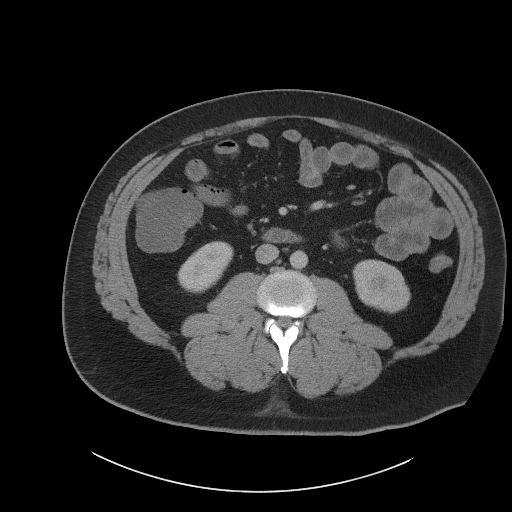
[im 63/111  soft-tissue]
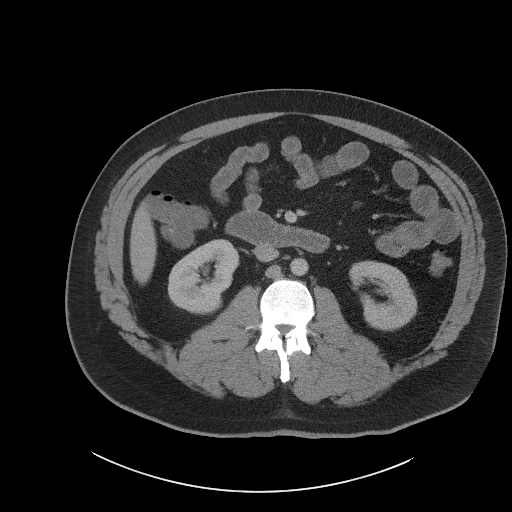
[im 72/111  soft-tissue]
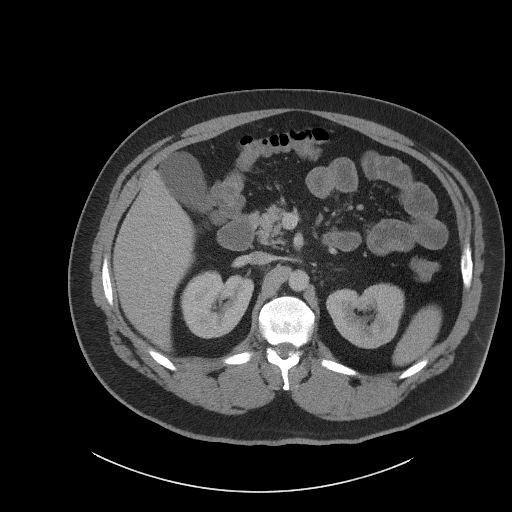
[im 72/111  bone]
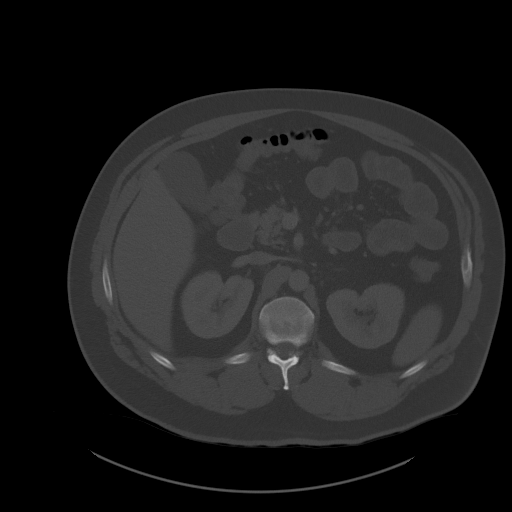
[im 82/111  soft-tissue]
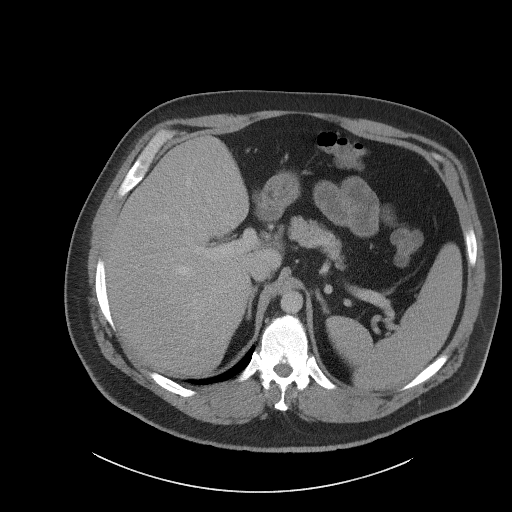
[im 87/111  soft-tissue]
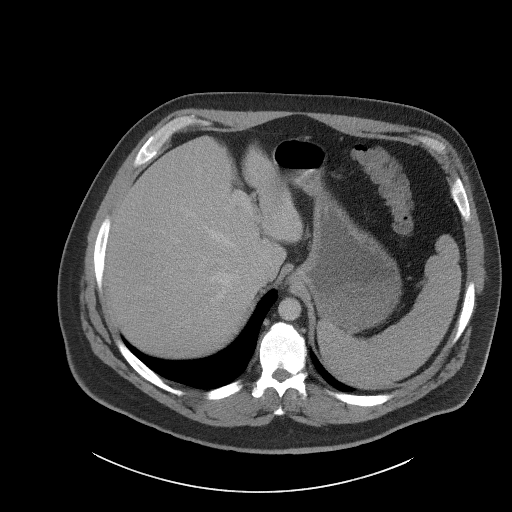
[im 96/111  soft-tissue]
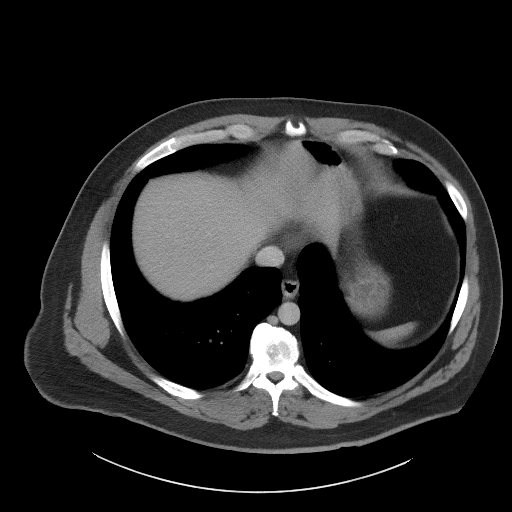
[im 106/111  soft-tissue]
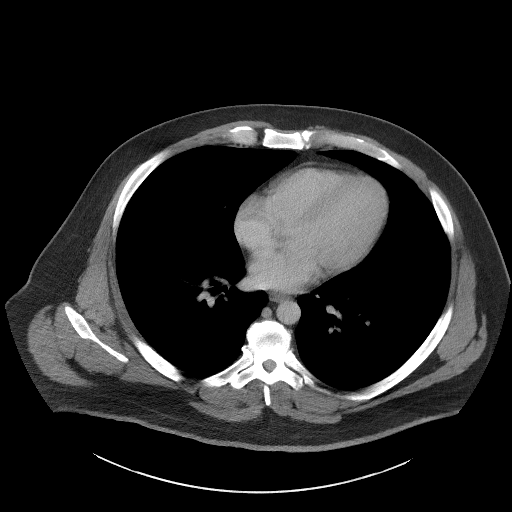

[Series 5: coronal st · coronal · 0.96mm/px · 3 of 117 slices shown]
[im 39/117  soft-tissue]
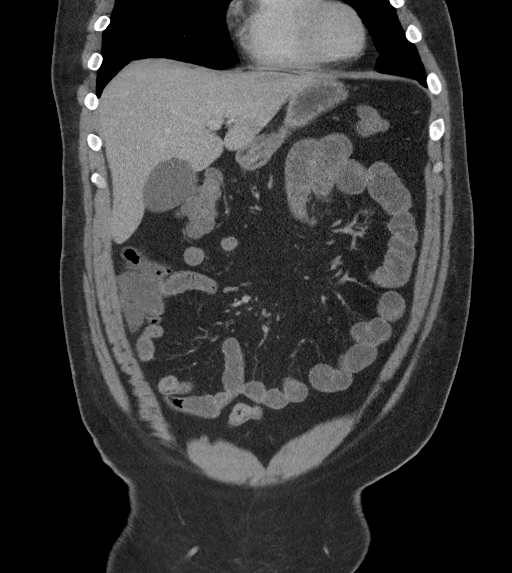
[im 52/117  soft-tissue]
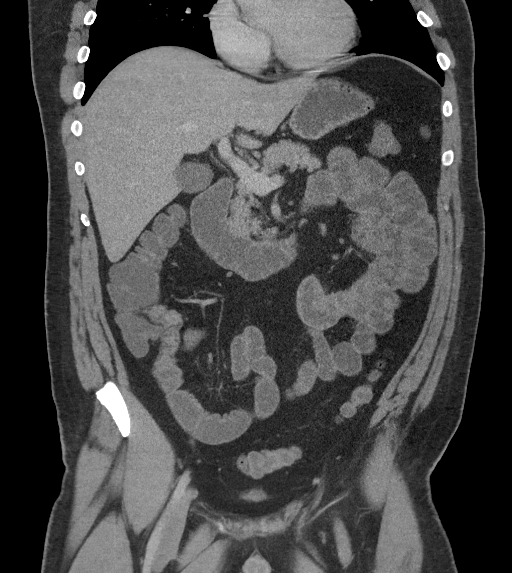
[im 65/117  soft-tissue]
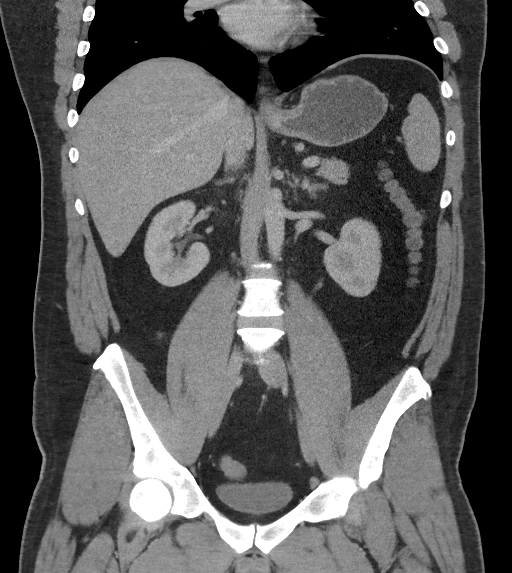

[16 of 46 positions shown; findings below may reference images not displayed]

FINDINGS: Lower chest: With the visualized lung bases are clear.

No intra-abdominal free air or free fluid.

Hepatobiliary: There is mild fatty infiltration of the liver. No
intrahepatic biliary ductal dilatation. The gallbladder is
unremarkable.

Pancreas: Unremarkable. No pancreatic ductal dilatation or
surrounding inflammatory changes.

Spleen: Normal in size without focal abnormality.

Adrenals/Urinary Tract: The adrenal glands are unremarkable. There
is a 2 mm nonobstructing left renal interpolar stone. A 3 mm
nonobstructing stone is also noted in the inferior pole of the right
kidney. There is no hydronephrosis on either side. The visualized
ureters and urinary bladder appear unremarkable.

Stomach/Bowel: There is loose stool throughout the colon compatible
with diarrheal state. Multiple normal caliber fluid-filled loops of
small bowel may be physiologic or represent mild enteritis. There is
no bowel obstruction. The appendix is normal.

Vascular/Lymphatic: The abdominal aorta and IVC are unremarkable. No
portal venous gas. There is no adenopathy.

Reproductive: The prostate and seminal vesicles are grossly
unremarkable. No pelvic mass.

Other: None

Musculoskeletal: No acute or significant osseous findings.
IMPRESSION: 1. Diarrheal state with possible mild enteritis. Correlation with
clinical exam and stool cultures recommended. No bowel obstruction.
Normal appendix.
2. Small nonobstructing bilateral renal calculi.  No hydronephrosis.
3. Mild fatty liver.

## 2022-04-06 DIAGNOSIS — N529 Male erectile dysfunction, unspecified: Secondary | ICD-10-CM | POA: Diagnosis not present

## 2022-04-25 DIAGNOSIS — M25562 Pain in left knee: Secondary | ICD-10-CM | POA: Diagnosis not present

## 2022-04-25 DIAGNOSIS — M25561 Pain in right knee: Secondary | ICD-10-CM | POA: Diagnosis not present

## 2022-05-01 ENCOUNTER — Ambulatory Visit: Payer: BC Managed Care – PPO | Attending: Family Medicine | Admitting: Physical Therapy

## 2022-05-01 ENCOUNTER — Other Ambulatory Visit: Payer: Self-pay

## 2022-05-01 ENCOUNTER — Encounter: Payer: Self-pay | Admitting: Physical Therapy

## 2022-05-01 DIAGNOSIS — G8929 Other chronic pain: Secondary | ICD-10-CM | POA: Insufficient documentation

## 2022-05-01 DIAGNOSIS — M25561 Pain in right knee: Secondary | ICD-10-CM | POA: Insufficient documentation

## 2022-05-01 DIAGNOSIS — M25562 Pain in left knee: Secondary | ICD-10-CM | POA: Diagnosis not present

## 2022-05-01 NOTE — Therapy (Signed)
OUTPATIENT PHYSICAL THERAPY LOWER EXTREMITY EVALUATION   Patient Name: Thomas [name] MRN: [id number] DOB:20-Jan-1981, 41 y.o., male Today's Date: 05/01/2022   PT End of Session - 05/01/22 1547     Visit Number 1    Number of Visits 6    Date for PT Re-Evaluation 05/29/22    PT Start Time 0315    PT Stop Time 0343    PT Time Calculation (min) 28 min    Activity Tolerance Patient tolerated treatment well    Behavior During Therapy [facility] for tasks assessed/performed             Past Medical History:  Diagnosis Date   Plantar fasciitis    History reviewed. No pertinent surgical history. Patient Active Problem List   Diagnosis Date Noted   Essential hypertension 05/16/2020   Uncontrolled type 2 diabetes mellitus with hyperglycemia, without long-term current use of insulin (HCC) 05/16/2020   Dehydration with hyponatremia 05/16/2020   Hyperlipidemia associated with type 2 diabetes mellitus (HCC) 01/27/2020   GERD without esophagitis 01/27/2020   Fatty liver 01/27/2020   Type 2 diabetes mellitus with other specified complication (HCC) 01/24/2017   PLEVA (pityriasis lichenoides et varioliformis acuta) 12/28/2015   REFERRING PROVIDER: Arlyce Harman DO.  REFERRING DIAG: Pain in right knee, pain in left knee.  THERAPY DIAG:  Chronic pain of right knee  Chronic pain of left knee  Rationale for Evaluation and Treatment Rehabilitation  ONSET DATE: About two years left knee.  SUBJECTIVE:   SUBJECTIVE STATEMENT: The patient presents to the clinic today with on/off left knee pain over the last two years and right knee pain with a lesser duration.  He reports flare-ups that can be very severe.  Today, he reports no knee pain.  Meloxicam and rest decrease pain.  Certain activities such as stairs can cause a lot of pain.  Often times her performs stairs in a non-reciprocating fashion.    PERTINENT HISTORY: DM.  PAIN:  Are you having pain? No  PRECAUTIONS: Other: Pain-free  bilateral LE there ex.  WEIGHT BEARING RESTRICTIONS No  FALLS:  Has patient fallen in last 6 months? 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  PLOF: Independent  PATIENT GOALS To be able to workout for weight loss   OBJECTIVE: POSTURE:  Bilateral patella appear to sit a bit laterally.  PALPATION: No palpable pain today.  LOWER EXTREMITY ROM:  Normal bilateral active knee range of motion.  LOWER EXTREMITY MMT:                      Normal LE strength.  LOWER EXTREMITY SPECIAL TESTS:  Normal bilateral knee stability. (-) bilateral Clarke's test.  GAIT: WNL.   ASSESSMENT:  CLINICAL IMPRESSION: The patient presents to the clinic today with bilateral knee pain.  Today, he reports no pain.  He has normal range of motion and strength.  Special testing was negative.  Stairs can be very painful, therefore, he performs a non-reciprocating stair gait pattern.  We discussed establishing an exercise program that does not increase his pain and such that he can reproduce in a gym he is a member of.  He is expected to do well with with skilled PT intervention. OBJECTIVE IMPAIRMENTS decreased activity tolerance.   ACTIVITY LIMITATIONS squatting and stairs  PARTICIPATION LIMITATIONS: occupation  REHAB POTENTIAL: Excellent  CLINICAL DECISION MAKING: Stable/uncomplicated  EVALUATION COMPLEXITY: Low   GOALS:   LONG TERM GOALS: Target date: 06/12/2022  Independent with an HEP. Baseline:  Goal status: INITIAL  2.  Perform ADL's with bilateral knee pain not > 2/10. Baseline:  Goal status: INITIAL PLAN: PT FREQUENCY: 1-2x/week  PT DURATION: 6 weeks  PLANNED INTERVENTIONS: Therapeutic exercises, Therapeutic activity, Patient/Family education, Self Care, Vasopneumatic device, and Traction  PLAN FOR NEXT SESSION: Pain-free bilateral LE there ex (O and CKC).  Recumbent bike.     Cypress Hinkson, [place], PT 05/01/2022, 3:48 PM

## 2022-05-04 ENCOUNTER — Telehealth: Payer: Self-pay

## 2022-05-04 NOTE — Chronic Care Management (AMB) (Signed)
  Care Management   Outreach Note  05/04/2022 Name: Thomas [name] MRN: [id number] DOB: 1981/08/25  An unsuccessful telephone outreach was attempted today. The patient was referred to the case management team for assistance with care management and care coordination.   Follow Up Plan:  A HIPAA compliant phone message was left for the patient providing contact information and requesting a return call.  The care management team will reach out to the patient again over the next 7 days.  If patient returns call to provider office, please advise to call Care Management Care Guide Penne Lash * at [phone]*  Penne Lash, RMA Care Guide [facility]  [place], [place] [address] Direct Dial: [phone] [email or web address]

## 2022-05-14 ENCOUNTER — Encounter: Payer: BC Managed Care – PPO | Admitting: Physical Therapy

## 2022-05-23 NOTE — Chronic Care Management (AMB) (Signed)
  Care Management   Outreach Note  05/23/2022 Name: Thomas [name] MRN: [id number] DOB: [birth year]  A second unsuccessful telephone outreach was attempted today. The patient was referred to the case management team for assistance with care management and care coordination.   Follow Up Plan:  A HIPAA compliant phone message was left for the patient providing contact information and requesting a return call.  The care management team will reach out to the patient again over the next 7 days.  If patient returns call to provider office, please advise to call Care Management Care Guide Thomas [name] * at [phone]*  Thomas [name], RMA Care Guide [facility]  [place], [place] [address] Direct Dial: [phone] [name]

## 2022-06-05 NOTE — Chronic Care Management (AMB) (Signed)
  Care Coordination  Outreach Note  06/05/2022 Name: Thomas [name] MRN: [id number] DOB: 12/02/1980   Care Coordination Outreach Attempts  A third unsuccessful outreach was attempted today to offer the patient with information about available care coordination services as a benefit of their health plan.   Follow Up Plan:  No further outreach attempts will be made at this time. We have been unable to contact the patient to offer or enroll patient in care coordination services  Encounter Outcome:  No Answer  Sig Penne Lash, RMA Care Guide [facility]  [place], [place] [address] Direct Dial: [phone] [email or web address]

## 2022-06-26 ENCOUNTER — Other Ambulatory Visit: Payer: Self-pay | Admitting: Family Medicine

## 2022-06-26 DIAGNOSIS — E1169 Type 2 diabetes mellitus with other specified complication: Secondary | ICD-10-CM

## 2022-06-26 MED ORDER — METFORMIN HCL 500 MG PO TABS: 500.0000 mg | ORAL_TABLET | Freq: Two times a day (BID) | ORAL | 0 refills | Status: DC

## 2022-07-01 ENCOUNTER — Other Ambulatory Visit: Payer: Self-pay | Admitting: Family Medicine

## 2022-07-01 DIAGNOSIS — E781 Pure hyperglyceridemia: Secondary | ICD-10-CM

## 2022-07-01 DIAGNOSIS — E1169 Type 2 diabetes mellitus with other specified complication: Secondary | ICD-10-CM

## 2022-07-01 IMAGING — DX DG CHEST 1V PORT
1 series · 2 of 2 positions shown · non-contrast
Comparison: None.

CLINICAL DATA: COVID, fever

EXAM:
PORTABLE CHEST 1 VIEW

[Series 1: chest · 0.14mm/px · 2 of 2 slices shown]
[im 1/2]
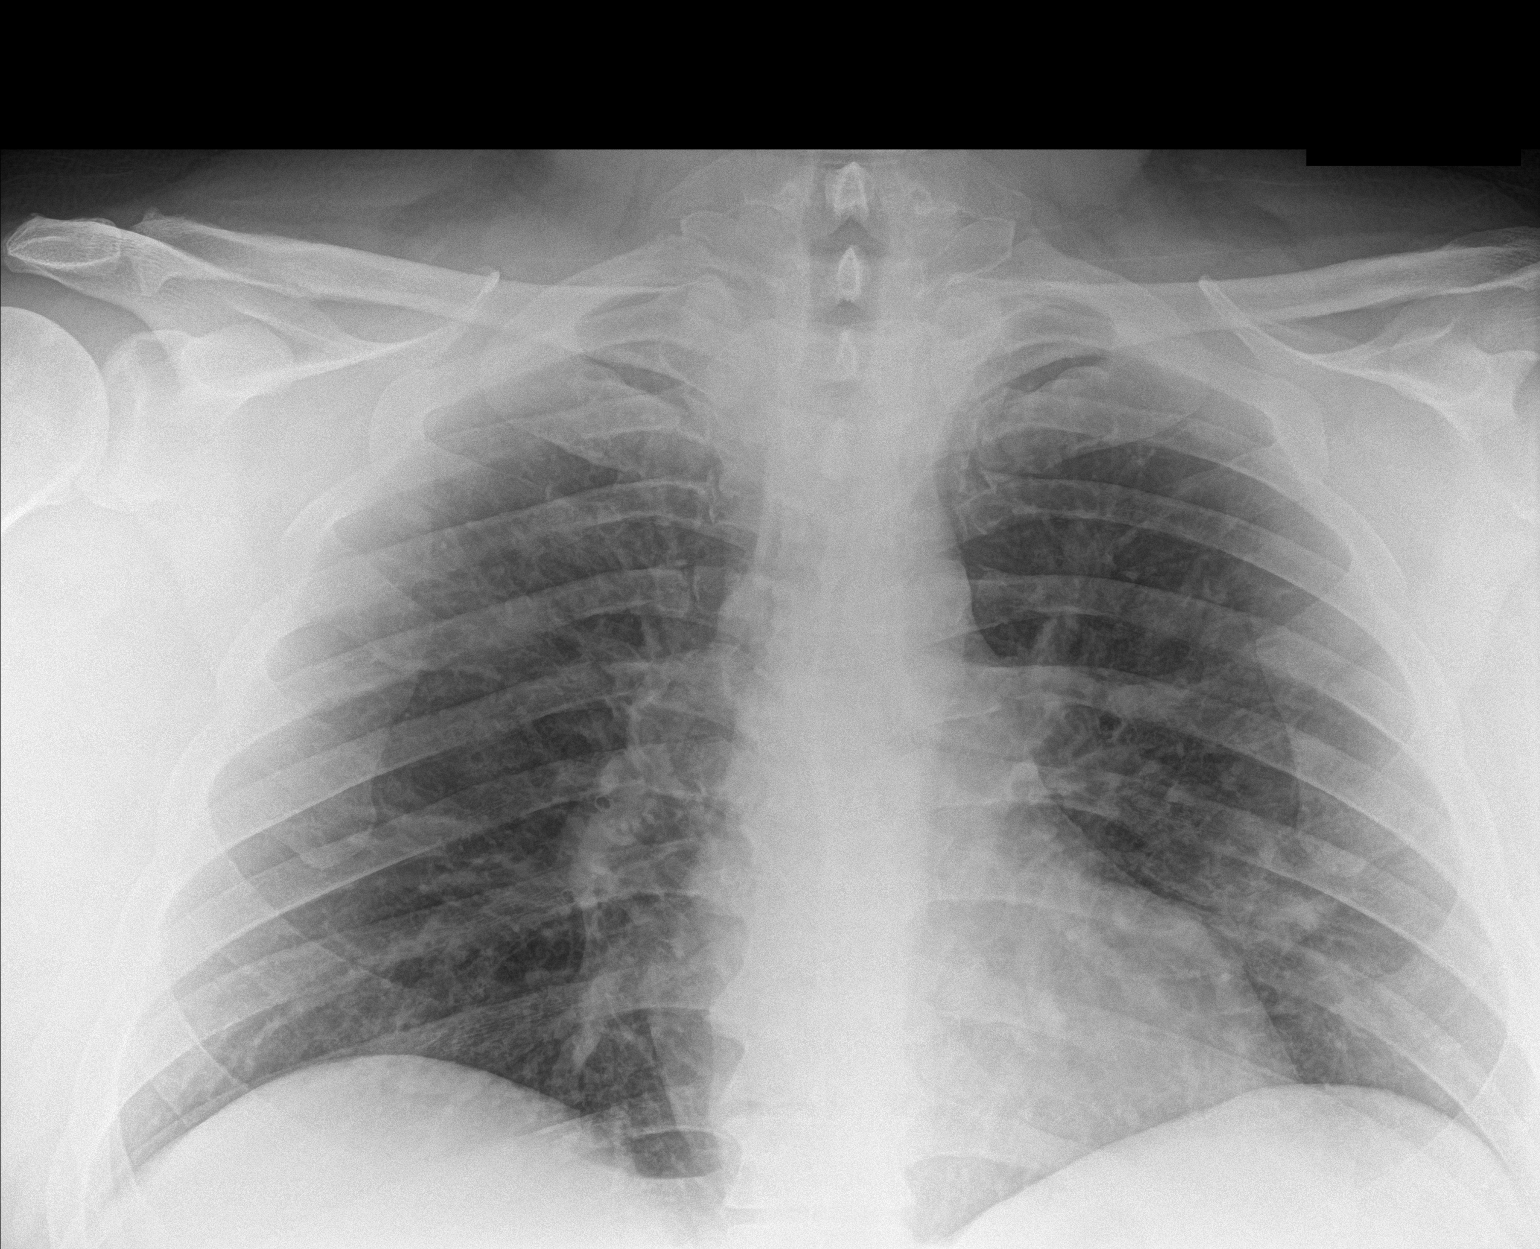
[im 2/2]
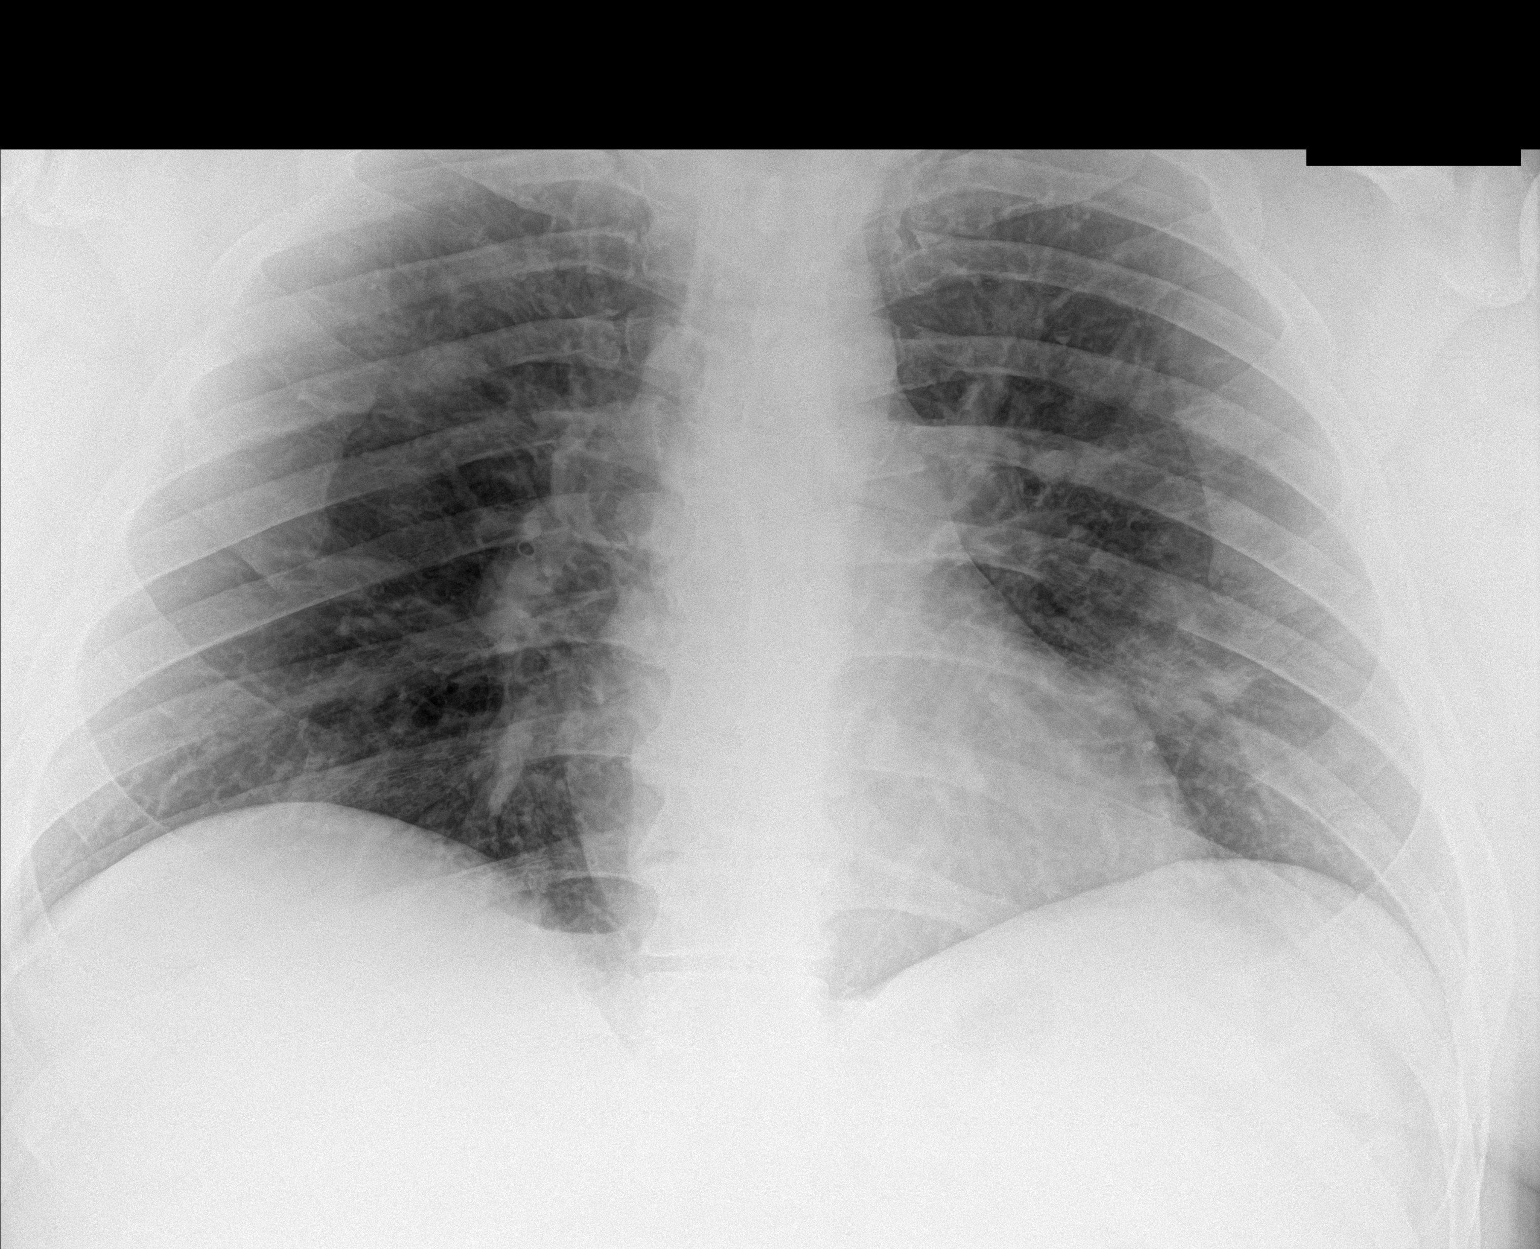

[2 of 2 positions shown; findings below may reference images not displayed]

FINDINGS: Patchy airspace opacities in the left lower lung. No pleural
effusion. Normal cardiomediastinal silhouette. No pneumothorax.
IMPRESSION: Patchy airspace opacities in the left lower lung suspicious for
pneumonia.

## 2022-07-26 ENCOUNTER — Other Ambulatory Visit: Payer: Self-pay | Admitting: Family Medicine

## 2022-07-26 DIAGNOSIS — E1169 Type 2 diabetes mellitus with other specified complication: Secondary | ICD-10-CM

## 2022-07-27 ENCOUNTER — Ambulatory Visit: Payer: BC Managed Care – PPO | Admitting: Family Medicine

## 2022-08-15 ENCOUNTER — Encounter: Payer: BC Managed Care – PPO | Admitting: Family Medicine

## 2022-09-28 ENCOUNTER — Telehealth: Payer: BC Managed Care – PPO | Admitting: Physician Assistant

## 2022-09-28 DIAGNOSIS — R059 Cough, unspecified: Secondary | ICD-10-CM | POA: Diagnosis not present

## 2022-09-28 DIAGNOSIS — J069 Acute upper respiratory infection, unspecified: Secondary | ICD-10-CM

## 2022-09-28 MED ORDER — FLUTICASONE PROPIONATE 50 MCG/ACT NA SUSP: 2.0000 | Freq: Every day | NASAL | 0 refills | Status: DC

## 2022-09-28 MED ORDER — BENZONATATE 100 MG PO CAPS: 100.0000 mg | ORAL_CAPSULE | Freq: Three times a day (TID) | ORAL | 0 refills | Status: DC | PRN

## 2022-09-28 NOTE — Progress Notes (Signed)
E-Visit for Upper Respiratory Infection   We are sorry you are not feeling well.  Here is how we plan to help!  Based on what you have shared with me, it looks like you may have a viral upper respiratory infection.  Upper respiratory infections are caused by a large number of viruses; however, rhinovirus is the most common cause.   Symptoms vary from person to person, with common symptoms including sore throat, cough, fatigue or lack of energy and feeling of general discomfort.  A low-grade fever of up to 100.4 may present, but is often uncommon.  Symptoms vary however, and are closely related to a person's age or underlying illnesses.  The most common symptoms associated with an upper respiratory infection are nasal discharge or congestion, cough, sneezing, headache and pressure in the ears and face.  These symptoms usually persist for about 3 to 10 days, but can last up to 2 weeks.  It is important to know that upper respiratory infections do not cause serious illness or complications in most cases.    Upper respiratory infections can be transmitted from person to person, with the most common method of transmission being a person's hands.  The virus is able to live on the skin and can infect other persons for up to 2 hours after direct contact.  Also, these can be transmitted when someone coughs or sneezes; thus, it is important to cover the mouth to reduce this risk.  To keep the spread of the illness at bay, good hand hygiene is very important.  This is an infection that is most likely caused by a virus. There are no specific treatments other than to help you with the symptoms until the infection runs its course.  We are sorry you are not feeling well.  Here is how we plan to help!   For nasal congestion, you may use an oral decongestants such as Mucinex D or if you have glaucoma or high blood pressure use plain Mucinex.  Saline nasal spray or nasal drops can help and can safely be used as often as  needed for congestion.  For your congestion, I have prescribed Fluticasone nasal spray one spray in each nostril twice a day  If you do not have a history of heart disease, hypertension, diabetes or thyroid disease, prostate/bladder issues or glaucoma, you may also use Sudafed to treat nasal congestion.  It is highly recommended that you consult with a pharmacist or your primary care physician to ensure this medication is safe for you to take.     If you have a cough, you may use cough suppressants such as Delsym and Robitussin.  If you have glaucoma or high blood pressure, you can also use Coricidin HBP.   For cough I have prescribed for you A prescription cough medication called Tessalon Perles 100 mg. You may take 1-2 capsules every 8 hours as needed for cough  If you have a sore or scratchy throat, use a saltwater gargle-  to  teaspoon of salt dissolved in a 4-ounce to 8-ounce glass of warm water.  Gargle the solution for approximately 15-30 seconds and then spit.  It is important not to swallow the solution.  You can also use throat lozenges/cough drops and Chloraseptic spray to help with throat pain or discomfort.  Warm or cold liquids can also be helpful in relieving throat pain.  For headache, pain or general discomfort, you can use Ibuprofen or Tylenol as directed.   Some authorities believe   that zinc sprays or the use of Echinacea may shorten the course of your symptoms.   HOME CARE Only take medications as instructed by your medical team. Be sure to drink plenty of fluids. Water is fine as well as fruit juices, sodas and electrolyte beverages. You may want to stay away from caffeine or alcohol. If you are nauseated, try taking small sips of liquids. How do you know if you are getting enough fluid? Your urine should be a pale yellow or almost colorless. Get rest. Taking a steamy shower or using a humidifier may help nasal congestion and ease sore throat pain. You can place a towel over  your head and breathe in the steam from hot water coming from a faucet. Using a saline nasal spray works much the same way. Cough drops, hard candies and sore throat lozenges may ease your cough. Avoid close contacts especially the very young and the elderly Cover your mouth if you cough or sneeze Always remember to wash your hands.   GET HELP RIGHT AWAY IF: You develop worsening fever. If your symptoms do not improve within 10 days You develop yellow or green discharge from your nose over 3 days. You have coughing fits You develop a severe head ache or visual changes. You develop shortness of breath, difficulty breathing or start having chest pain Your symptoms persist after you have completed your treatment plan  MAKE SURE YOU  Understand these instructions. Will watch your condition. Will get help right away if you are not doing well or get worse.  Thank you for choosing an e-visit.  Your e-visit answers were reviewed by a board certified advanced clinical practitioner to complete your personal care plan. Depending upon the condition, your plan could have included both over the counter or prescription medications.  Please review your pharmacy choice. Make sure the pharmacy is open so you can pick up prescription now. If there is a problem, you may contact your provider through MyChart messaging and have the prescription routed to another pharmacy.  Your safety is important to us. If you have drug allergies check your prescription carefully.   For the next 24 hours you can use MyChart to ask questions about today's visit, request a non-urgent call back, or ask for a work or school excuse. You will get an email in the next two days asking about your experience. I hope that your e-visit has been valuable and will speed your recovery.   I have spent 5 minutes in review of e-visit questionnaire, review and updating patient chart, medical decision making and response to patient.    Thaniel Coluccio M Rowyn Spilde, PA-C  

## 2022-10-04 ENCOUNTER — Encounter: Payer: BC Managed Care – PPO | Admitting: Family Medicine

## 2022-10-15 ENCOUNTER — Encounter: Payer: BC Managed Care – PPO | Admitting: Family Medicine

## 2022-10-16 ENCOUNTER — Encounter: Payer: Self-pay | Admitting: Family Medicine

## 2022-11-23 ENCOUNTER — Other Ambulatory Visit: Payer: Self-pay | Admitting: Family Medicine

## 2022-11-23 DIAGNOSIS — E1169 Type 2 diabetes mellitus with other specified complication: Secondary | ICD-10-CM

## 2022-11-28 ENCOUNTER — Encounter: Payer: Self-pay | Admitting: Family Medicine

## 2022-11-28 ENCOUNTER — Ambulatory Visit (INDEPENDENT_AMBULATORY_CARE_PROVIDER_SITE_OTHER): Payer: BC Managed Care – PPO | Admitting: Family Medicine

## 2022-11-28 VITALS — BP 122/86 | HR 101 | Ht 77.0 in | Wt 320.0 lb

## 2022-11-28 DIAGNOSIS — Z0001 Encounter for general adult medical examination with abnormal findings: Secondary | ICD-10-CM

## 2022-11-28 DIAGNOSIS — K219 Gastro-esophageal reflux disease without esophagitis: Secondary | ICD-10-CM

## 2022-11-28 DIAGNOSIS — Z0000 Encounter for general adult medical examination without abnormal findings: Secondary | ICD-10-CM | POA: Diagnosis not present

## 2022-11-28 DIAGNOSIS — Z23 Encounter for immunization: Secondary | ICD-10-CM

## 2022-11-28 DIAGNOSIS — E1165 Type 2 diabetes mellitus with hyperglycemia: Secondary | ICD-10-CM

## 2022-11-28 DIAGNOSIS — E1159 Type 2 diabetes mellitus with other circulatory complications: Secondary | ICD-10-CM

## 2022-11-28 DIAGNOSIS — E785 Hyperlipidemia, unspecified: Secondary | ICD-10-CM

## 2022-11-28 DIAGNOSIS — I152 Hypertension secondary to endocrine disorders: Secondary | ICD-10-CM

## 2022-11-28 DIAGNOSIS — E781 Pure hyperglyceridemia: Secondary | ICD-10-CM | POA: Diagnosis not present

## 2022-11-28 DIAGNOSIS — E1169 Type 2 diabetes mellitus with other specified complication: Secondary | ICD-10-CM

## 2022-11-28 LAB — BAYER DCA HB A1C WAIVED: HB A1C (BAYER DCA - WAIVED): 11 % — ABNORMAL HIGH (ref 4.8–5.6)

## 2022-11-28 MED ORDER — ROSUVASTATIN CALCIUM 10 MG PO TABS: 10.0000 mg | ORAL_TABLET | Freq: Every day | ORAL | 3 refills | Status: DC

## 2022-11-28 MED ORDER — METFORMIN HCL 1000 MG PO TABS: 1000.0000 mg | ORAL_TABLET | Freq: Two times a day (BID) | ORAL | 3 refills | Status: DC

## 2022-11-28 MED ORDER — FREESTYLE LIBRE 2 SENSOR MISC: 1.0000 | 11 refills | Status: DC

## 2022-11-28 MED ORDER — METFORMIN HCL 500 MG PO TABS: 500.0000 mg | ORAL_TABLET | Freq: Two times a day (BID) | ORAL | 3 refills | Status: DC

## 2022-11-28 MED ORDER — TRULICITY 0.75 MG/0.5ML ~~LOC~~ SOAJ: 0.7500 mg | SUBCUTANEOUS | 3 refills | Status: DC

## 2022-11-28 MED ORDER — LOSARTAN POTASSIUM 50 MG PO TABS: 50.0000 mg | ORAL_TABLET | Freq: Every day | ORAL | 3 refills | Status: DC

## 2022-11-28 MED ORDER — OMEPRAZOLE 20 MG PO CPDR: 20.0000 mg | DELAYED_RELEASE_CAPSULE | Freq: Every day | ORAL | 3 refills | Status: DC

## 2022-11-28 MED ORDER — FENOFIBRATE 145 MG PO TABS: 145.0000 mg | ORAL_TABLET | Freq: Every day | ORAL | 3 refills | Status: DC

## 2022-11-28 NOTE — Progress Notes (Signed)
BP 122/86   Pulse (!) 101   Ht '6\' 5"'$  (1.956 m)   Wt (!) 320 lb (145.2 kg)   SpO2 98%   BMI 37.95 kg/m    Subjective:   Patient ID: Thomas [name], male    DOB: 15-Sep-1981, 42 y.o.   MRN: [id number]  HPI: Thomas [name] is a 42 y.o. male presenting on 11/28/2022 for Medical Management of Chronic Issues, Diabetes, Hyperlipidemia, and Hypertension   HPI Physical Exam Patient denies any chest pain, shortness of breath, headaches or vision issues, abdominal complaints, diarrhea, nausea, vomiting, or joint issues.   Type 2 diabetes mellitus Patient comes in today for recheck of his diabetes. Patient has been currently taking metformin. Patient is currently on an ACE inhibitor/ARB. Patient has not seen an ophthalmologist this year. Patient denies any issues with their feet. The symptom started onset as an adult hypertension and hyperlipidemia ARE RELATED TO DM   Hypertension Patient is currently on losartan, and their blood pressure today is 122/86. Patient denies any lightheadedness or dizziness. Patient denies headaches, blurred vision, chest pains, shortness of breath, or weakness. Denies any side effects from medication and is content with current medication.   Hyperlipidemia Patient is coming in for recheck of his hyperlipidemia. The patient is currently taking fenofibrate. They deny any issues with myalgias or history of liver damage from it. They deny any focal numbness or weakness or chest pain.   Relevant past medical, surgical, family and social history reviewed and updated as indicated. Interim medical history since our last visit reviewed. Allergies and medications reviewed and updated.  Review of Systems  Constitutional:  Negative for chills and fever.  Eyes:  Negative for visual disturbance.  Respiratory:  Negative for shortness of breath and wheezing.   Cardiovascular:  Negative for chest pain and leg swelling.  Musculoskeletal:  Negative for back pain and gait problem.   Skin:  Negative for rash.  Neurological:  Negative for dizziness, weakness and numbness.  All other systems reviewed and are negative.   Per HPI unless specifically indicated above   Allergies as of 11/28/2022       Reactions   Amoxicillin Hives, Rash   Penicillins Hives, Rash        Medication List        Accurate as of November 28, 2022 11:59 PM. If you have any questions, ask your nurse or doctor.          STOP taking these medications    benzonatate 100 MG capsule Commonly known as: TESSALON Stopped by: Fransisca Kaufmann Celestine Bougie, MD       TAKE these medications    fenofibrate 145 MG tablet Commonly known as: TRICOR Take 1 tablet (145 mg total) by mouth daily.   fluticasone 50 MCG/ACT nasal spray Commonly known as: FLONASE Place 2 sprays into both nostrils daily.   FreeStyle Libre 2 Sensor Misc 1 each by Does not apply route every 14 (fourteen) days. Started by: Worthy Rancher, MD   losartan 50 MG tablet Commonly known as: COZAAR Take 1 tablet (50 mg total) by mouth daily.   metFORMIN 1000 MG tablet Commonly known as: GLUCOPHAGE Take 1 tablet (1,000 mg total) by mouth 2 (two) times daily with a meal. What changed:  medication strength how much to take Changed by: Worthy Rancher, MD   multivitamin with minerals Tabs tablet Take 1 tablet by mouth daily.   omeprazole 20 MG capsule Commonly known as: PRILOSEC Take 1 capsule (  20 mg total) by mouth daily. What changed: additional instructions Changed by: Fransisca Kaufmann Davine Coba, MD   ondansetron 4 MG disintegrating tablet Commonly known as: Zofran ODT Take 1 tablet (4 mg total) by mouth every 8 (eight) hours as needed for nausea or vomiting.   promethazine 25 MG suppository Commonly known as: PHENERGAN Place 1 suppository (25 mg total) rectally every 6 (six) hours as needed for nausea or vomiting.   rosuvastatin 10 MG tablet Commonly known as: Crestor Take 1 tablet (10 mg total) by mouth  daily. Started by: Fransisca Kaufmann Xaden Kaufman, MD   Trulicity [id number] 0000000 Sopn Generic drug: Dulaglutide Inject 0.75 mg into the skin once a week. Started by: Fransisca Kaufmann Arch Methot, MD         Objective:   BP 122/86   Pulse (!) 101   Ht '6\' 5"'$  (1.956 m)   Wt (!) 320 lb (145.2 kg)   SpO2 98%   BMI 37.95 kg/m   Wt Readings from Last 3 Encounters:  11/28/22 (!) 320 lb (145.2 kg)  01/15/22 (!) 322 lb 9.6 oz (146.3 kg)  04/05/21 (!) 330 lb (149.7 kg)    Physical Exam Vitals and nursing note reviewed.  Constitutional:      General: He is not in acute distress.    Appearance: He is well-developed. He is not diaphoretic.  Eyes:     General: No scleral icterus.    Conjunctiva/sclera: Conjunctivae normal.  Neck:     Thyroid: No thyromegaly.  Cardiovascular:     Rate and Rhythm: Normal rate and regular rhythm.     Heart sounds: Normal heart sounds. No murmur heard. Pulmonary:     Effort: Pulmonary effort is normal. No respiratory distress.     Breath sounds: Normal breath sounds. No wheezing.  Musculoskeletal:        General: No swelling. Normal range of motion.     Cervical back: Neck supple.  Lymphadenopathy:     Cervical: No cervical adenopathy.  Skin:    General: Skin is warm and dry.     Findings: No rash.  Neurological:     Mental Status: He is alert and oriented to person, place, and time.     Coordination: Coordination normal.  Psychiatric:        Behavior: Behavior normal.       Assessment & Plan:   Problem List Items Addressed This Visit       Endocrine   Type 2 diabetes mellitus with other specified complication (HCC)   Relevant Medications   losartan (COZAAR) 50 MG tablet   Dulaglutide (TRULICITY) A999333 0000000 SOPN   rosuvastatin (CRESTOR) 10 MG tablet   metFORMIN (GLUCOPHAGE) 1000 MG tablet   Other Relevant Orders   Microalbumin / creatinine urine ratio   Hyperlipidemia associated with type 2 diabetes mellitus (HCC)   Relevant Medications    fenofibrate (TRICOR) 145 MG tablet   losartan (COZAAR) 50 MG tablet   Dulaglutide (TRULICITY) A999333 0000000 SOPN   rosuvastatin (CRESTOR) 10 MG tablet   metFORMIN (GLUCOPHAGE) 1000 MG tablet   Uncontrolled type 2 diabetes mellitus with hyperglycemia, without long-term current use of insulin (HCC)   Relevant Medications   losartan (COZAAR) 50 MG tablet   Dulaglutide (TRULICITY) A999333 0000000 SOPN   rosuvastatin (CRESTOR) 10 MG tablet   metFORMIN (GLUCOPHAGE) 1000 MG tablet   Other Visit Diagnoses     Well adult exam    -  Primary   Relevant Orders   CBC with Differential/Platelet (Completed)  CMP14+EGFR (Completed)   Lipid panel (Completed)   Bayer DCA Hb A1c Waived (Completed)   Hypertriglyceridemia       Relevant Medications   fenofibrate (TRICOR) 145 MG tablet   losartan (COZAAR) 50 MG tablet   rosuvastatin (CRESTOR) 10 MG tablet   Gastroesophageal reflux disease without esophagitis       Relevant Medications   omeprazole (PRILOSEC) 20 MG capsule   Need for Tdap vaccination       Relevant Orders   Tdap vaccine greater than or equal to 7yo IM       Will start Trulicity, will also start Crestor.  Increase metformin to 1000 mg twice daily. Follow up plan: Return in about 3 months (around 02/26/2023), or if symptoms worsen or fail to improve, for Diabetes and hypertension and cholesterol recheck.  Counseling provided for all of the vaccine components Orders Placed This Encounter  Procedures   Tdap vaccine greater than or equal to 7yo IM   CBC with Differential/Platelet   CMP14+EGFR   Lipid panel   Bayer DCA Hb A1c Waived   Microalbumin / creatinine urine ratio    Caryl Pina, MD [facility] 11/29/2022, 7:45 AM

## 2022-11-29 LAB — CBC WITH DIFFERENTIAL/PLATELET
Basophils Absolute: 0 10*3/uL (ref 0.0–0.2)
Basos: 1 %
EOS (ABSOLUTE): 0.2 10*3/uL (ref 0.0–0.4)
Eos: 4 %
Hematocrit: 46.8 % (ref 37.5–51.0)
Hemoglobin: 15.7 g/dL (ref 13.0–17.7)
Immature Grans (Abs): 0 10*3/uL (ref 0.0–0.1)
Immature Granulocytes: 0 %
Lymphocytes Absolute: 2.9 10*3/uL (ref 0.7–3.1)
Lymphs: 44 %
MCH: 26.5 pg — ABNORMAL LOW (ref 26.6–33.0)
MCHC: 33.5 g/dL (ref 31.5–35.7)
MCV: 79 fL (ref 79–97)
Monocytes Absolute: 0.6 10*3/uL (ref 0.1–0.9)
Monocytes: 10 %
Neutrophils Absolute: 2.7 10*3/uL (ref 1.4–7.0)
Neutrophils: 41 %
Platelets: 351 10*3/uL (ref 150–450)
RBC: 5.92 x10E6/uL — ABNORMAL HIGH (ref 4.14–5.80)
RDW: 13.9 % (ref 11.6–15.4)
WBC: 6.5 10*3/uL (ref 3.4–10.8)

## 2022-11-29 LAB — CMP14+EGFR
ALT: 16 IU/L (ref 0–44)
AST: 16 IU/L (ref 0–40)
Albumin/Globulin Ratio: 1.8 (ref 1.2–2.2)
Albumin: 4.3 g/dL (ref 4.1–5.1)
Alkaline Phosphatase: 73 IU/L (ref 44–121)
BUN/Creatinine Ratio: 15 (ref 9–20)
BUN: 18 mg/dL (ref 6–24)
Bilirubin Total: 0.7 mg/dL (ref 0.0–1.2)
CO2: 23 mmol/L (ref 20–29)
Calcium: 9.4 mg/dL (ref 8.7–10.2)
Chloride: 99 mmol/L (ref 96–106)
Creatinine, Ser: 1.19 mg/dL (ref 0.76–1.27)
Globulin, Total: 2.4 g/dL (ref 1.5–4.5)
Glucose: 177 mg/dL — ABNORMAL HIGH (ref 70–99)
Potassium: 4.1 mmol/L (ref 3.5–5.2)
Sodium: 138 mmol/L (ref 134–144)
Total Protein: 6.7 g/dL (ref 6.0–8.5)
eGFR: 79 mL/min/{1.73_m2} (ref >59)

## 2022-11-29 LAB — LIPID PANEL
Chol/HDL Ratio: 6 ratio — ABNORMAL HIGH (ref 0.0–5.0)
Cholesterol, Total: 175 mg/dL (ref 100–199)
HDL: 29 mg/dL — ABNORMAL LOW (ref >39)
LDL Chol Calc (NIH): 116 mg/dL — ABNORMAL HIGH (ref 0–99)
Triglycerides: 169 mg/dL — ABNORMAL HIGH (ref 0–149)
VLDL Cholesterol Cal: 30 mg/dL (ref 5–40)

## 2022-12-03 ENCOUNTER — Other Ambulatory Visit: Payer: Self-pay

## 2022-12-03 MED ORDER — ROSUVASTATIN CALCIUM 20 MG PO TABS: 20.0000 mg | ORAL_TABLET | Freq: Every day | ORAL | 3 refills | Status: DC

## 2022-12-05 DIAGNOSIS — Z0001 Encounter for general adult medical examination with abnormal findings: Secondary | ICD-10-CM | POA: Diagnosis not present

## 2022-12-05 DIAGNOSIS — Z23 Encounter for immunization: Secondary | ICD-10-CM | POA: Diagnosis not present

## 2022-12-07 ENCOUNTER — Encounter: Payer: Self-pay | Admitting: Family Medicine

## 2022-12-07 MED ORDER — ONDANSETRON 4 MG PO TBDP: 4.0000 mg | ORAL_TABLET | Freq: Three times a day (TID) | ORAL | 1 refills | Status: DC | PRN

## 2022-12-12 ENCOUNTER — Encounter: Payer: Self-pay | Admitting: Family Medicine

## 2022-12-12 ENCOUNTER — Ambulatory Visit: Payer: BC Managed Care – PPO | Admitting: Family Medicine

## 2022-12-12 VITALS — BP 125/87 | HR 109 | Ht 77.0 in | Wt 315.0 lb

## 2022-12-12 DIAGNOSIS — T50905A Adverse effect of unspecified drugs, medicaments and biological substances, initial encounter: Secondary | ICD-10-CM

## 2022-12-12 DIAGNOSIS — R112 Nausea with vomiting, unspecified: Secondary | ICD-10-CM

## 2022-12-12 MED ORDER — SUCRALFATE 1 G PO TABS: 1.0000 g | ORAL_TABLET | Freq: Three times a day (TID) | ORAL | 1 refills | Status: DC

## 2022-12-12 NOTE — Progress Notes (Signed)
BP 125/87   Pulse (!) 109   Ht '6\' 5"'$  (1.956 m)   Wt (!) 315 lb (142.9 kg)   SpO2 96%   BMI 37.35 kg/m    Subjective:   Patient ID: Thomas [name], male    DOB: 1980/12/23, 42 y.o.   MRN: [id number]  HPI: Thomas [name] is a 42 y.o. male presenting on 12/12/2022 for Medical Management of Chronic Issues, Diarrhea, Nausea, Emesis, and Gas   HPI Nausea vomiting and indigestion. Patient was feeling a little ill before we started his medicine and he had some indigestion.  He was already taking omeprazole but since he started Trulicity he has had episodes the past couple weeks including last Thursday and then last night and today as well where he has nausea and vomiting diarrhea and gas and lump in his throat.  He says it was a little bit less last night today than it was the week before this is the second time taking the injection.  His only concern is that he has missed some work because of it.  He denies any abdominal pain or fevers or chills.  He denies any constipation or blood in his stool.  Relevant past medical, surgical, family and social history reviewed and updated as indicated. Interim medical history since our last visit reviewed. Allergies and medications reviewed and updated.  Review of Systems  Constitutional:  Negative for chills and fever.  Respiratory:  Negative for shortness of breath and wheezing.   Cardiovascular:  Negative for chest pain and leg swelling.  Gastrointestinal:  Positive for abdominal distention, diarrhea, nausea and vomiting. Negative for abdominal pain and constipation.  Musculoskeletal:  Negative for back pain and gait problem.  Skin:  Negative for rash.  All other systems reviewed and are negative.   Per HPI unless specifically indicated above   Allergies as of 12/12/2022       Reactions   Amoxicillin Hives, Rash   Penicillins Hives, Rash        Medication List        Accurate as of December 12, 2022  5:02 PM. If you have any questions,  ask your nurse or doctor.          fenofibrate 145 MG tablet Commonly known as: TRICOR Take 1 tablet (145 mg total) by mouth daily.   fluticasone 50 MCG/ACT nasal spray Commonly known as: FLONASE Place 2 sprays into both nostrils daily.   FreeStyle Libre 2 Sensor Misc 1 each by Does not apply route every 14 (fourteen) days.   losartan 50 MG tablet Commonly known as: COZAAR Take 1 tablet (50 mg total) by mouth daily.   metFORMIN 1000 MG tablet Commonly known as: GLUCOPHAGE Take 1 tablet (1,000 mg total) by mouth 2 (two) times daily with a meal.   multivitamin with minerals Tabs tablet Take 1 tablet by mouth daily.   omeprazole 20 MG capsule Commonly known as: PRILOSEC Take 1 capsule (20 mg total) by mouth daily.   ondansetron 4 MG disintegrating tablet Commonly known as: Zofran ODT Take 1 tablet (4 mg total) by mouth every 8 (eight) hours as needed for nausea or vomiting.   promethazine 25 MG suppository Commonly known as: PHENERGAN Place 1 suppository (25 mg total) rectally every 6 (six) hours as needed for nausea or vomiting.   rosuvastatin 20 MG tablet Commonly known as: Crestor Take 1 tablet (20 mg total) by mouth daily.   sucralfate 1 g tablet Commonly known as: Carafate Take  1 tablet (1 g total) by mouth 4 (four) times daily -  with meals and at bedtime. Started by: Fransisca Kaufmann Derelle Cockrell, MD   Trulicity A999333 0000000 Sopn Generic drug: Dulaglutide Inject 0.75 mg into the skin once a week.         Objective:   BP 125/87   Pulse (!) 109   Ht '6\' 5"'$  (1.956 m)   Wt (!) 315 lb (142.9 kg)   SpO2 96%   BMI 37.35 kg/m   Wt Readings from Last 3 Encounters:  12/12/22 (!) 315 lb (142.9 kg)  11/28/22 (!) 320 lb (145.2 kg)  01/15/22 (!) 322 lb 9.6 oz (146.3 kg)    Physical Exam Vitals and nursing note reviewed.  Constitutional:      General: He is not in acute distress.    Appearance: He is well-developed. He is not diaphoretic.  Eyes:     General: No  scleral icterus.    Conjunctiva/sclera: Conjunctivae normal.  Neck:     Thyroid: No thyromegaly.  Cardiovascular:     Rate and Rhythm: Normal rate and regular rhythm.     Heart sounds: Normal heart sounds. No murmur heard. Pulmonary:     Effort: Pulmonary effort is normal. No respiratory distress.     Breath sounds: Normal breath sounds. No wheezing.  Abdominal:     General: Abdomen is flat. Bowel sounds are normal. There is no distension.     Tenderness: There is no abdominal tenderness. There is no guarding or rebound.  Musculoskeletal:     Cervical back: Neck supple.  Lymphadenopathy:     Cervical: No cervical adenopathy.  Skin:    General: Skin is warm and dry.     Findings: No rash.  Neurological:     Mental Status: He is alert and oriented to person, place, and time.     Coordination: Coordination normal.  Psychiatric:        Behavior: Behavior normal.       Assessment & Plan:   Problem List Items Addressed This Visit   None Visit Diagnoses     Drug-induced nausea and vomiting    -  Primary   Relevant Medications   sucralfate (CARAFATE) 1 g tablet       Continue omeprazole and continue Pepcid and recommended that he also use the Zofran still and the Phenergan but that he could also add the Carafate to help.  Do think that this is going to be less each week so he should be better next week. Follow up plan: Return if symptoms worsen or fail to improve.  Counseling provided for all of the vaccine components No orders of the defined types were placed in this encounter.   Caryl Pina, MD [facility] 12/12/2022, 5:02 PM

## 2022-12-25 ENCOUNTER — Other Ambulatory Visit: Payer: Self-pay | Admitting: Family Medicine

## 2022-12-25 MED ORDER — FREESTYLE LIBRE 3 SENSOR MISC: 1.0000 | 12 refills | Status: DC

## 2022-12-25 NOTE — Addendum Note (Signed)
Addended by: Thana Ates on: 12/25/2022 03:53 PM   Modules accepted: Orders

## 2022-12-29 ENCOUNTER — Other Ambulatory Visit: Payer: Self-pay | Admitting: Family Medicine

## 2022-12-29 DIAGNOSIS — R112 Nausea with vomiting, unspecified: Secondary | ICD-10-CM

## 2023-03-01 ENCOUNTER — Encounter: Payer: Self-pay | Admitting: Family Medicine

## 2023-03-01 ENCOUNTER — Ambulatory Visit: Payer: BC Managed Care – PPO | Admitting: Family Medicine

## 2023-03-01 VITALS — BP 127/78 | HR 101 | Ht 77.0 in | Wt 319.0 lb

## 2023-03-01 DIAGNOSIS — E1165 Type 2 diabetes mellitus with hyperglycemia: Secondary | ICD-10-CM

## 2023-03-01 DIAGNOSIS — E785 Hyperlipidemia, unspecified: Secondary | ICD-10-CM

## 2023-03-01 DIAGNOSIS — Z7984 Long term (current) use of oral hypoglycemic drugs: Secondary | ICD-10-CM

## 2023-03-01 DIAGNOSIS — E1169 Type 2 diabetes mellitus with other specified complication: Secondary | ICD-10-CM

## 2023-03-01 LAB — BAYER DCA HB A1C WAIVED: HB A1C (BAYER DCA - WAIVED): 9 % — ABNORMAL HIGH (ref 4.8–5.6)

## 2023-03-01 MED ORDER — EMPAGLIFLOZIN 25 MG PO TABS
25.0000 mg | ORAL_TABLET | Freq: Every day | ORAL | 3 refills | Status: DC
Start: 2023-03-01 — End: 2023-03-12

## 2023-03-01 NOTE — Progress Notes (Signed)
BP 127/78   Pulse (!) 101   Ht 6\' 5"  (1.956 m)   Wt (!) 319 lb (144.7 kg)   SpO2 96%   BMI 37.83 kg/m    Subjective:   Patient ID: Thomas [name], male    DOB: January 04, 1981, 42 y.o.   MRN: [id number]  HPI: Thomas [name] is a 42 y.o. male presenting on 03/01/2023 for Medical Management of Chronic Issues and Diabetes   HPI Type 2 diabetes mellitus Patient comes in today for recheck of his diabetes. Patient has been currently taking metformin. Patient is currently on an ACE inhibitor/ARB. Patient has not seen an ophthalmologist this year. Patient denies any new issues with their feet. The symptom started onset as an adult hyperlipidemia and hypertriglyceridemia and fatty liver ARE RELATED TO DM. he got too sick to his stomach Ozempic and Trulicity  Hyperlipidemia hypertriglyceridemia Patient is coming in for recheck of his hyperlipidemia. The patient is currently taking fenofibrate and Crestor. They deny any issues with myalgias or history of liver damage from it. They deny any focal numbness or weakness or chest pain.   Relevant past medical, surgical, family and social history reviewed and updated as indicated. Interim medical history since our last visit reviewed. Allergies and medications reviewed and updated.  Review of Systems  Constitutional:  Negative for chills and fever.  Eyes:  Negative for visual disturbance.  Respiratory:  Negative for shortness of breath and wheezing.   Cardiovascular:  Negative for chest pain and leg swelling.  Musculoskeletal:  Negative for back pain and gait problem.  Skin:  Negative for rash.  Neurological:  Negative for dizziness.  All other systems reviewed and are negative.   Per HPI unless specifically indicated above   Allergies as of 03/01/2023       Reactions   Amoxicillin Hives, Rash   Penicillins Hives, Rash        Medication List        Accurate as of Mar 01, 2023  4:06 PM. If you have any questions, ask your nurse or  doctor.          STOP taking these medications    Trulicity 0.75 MG/0.5ML Sopn Generic drug: Dulaglutide Stopped by: Elige Radon Deo Mehringer, MD       TAKE these medications    empagliflozin 25 MG Tabs tablet Commonly known as: Jardiance Take 1 tablet (25 mg total) by mouth daily before breakfast. Started by: Elige Radon Blimy Napoleon, MD   fenofibrate 145 MG tablet Commonly known as: TRICOR Take 1 tablet (145 mg total) by mouth daily.   fluticasone 50 MCG/ACT nasal spray Commonly known as: FLONASE Place 2 sprays into both nostrils daily.   FreeStyle Libre 3 Sensor Misc 1 Device by Does not apply route every 14 (fourteen) days. Place 1 sensor on the skin every 14 days. Use to check glucose continuously DX: E11.69   losartan 50 MG tablet Commonly known as: COZAAR Take 1 tablet (50 mg total) by mouth daily.   metFORMIN 1000 MG tablet Commonly known as: GLUCOPHAGE Take 1 tablet (1,000 mg total) by mouth 2 (two) times daily with a meal.   multivitamin with minerals Tabs tablet Take 1 tablet by mouth daily.   omeprazole 20 MG capsule Commonly known as: PRILOSEC Take 1 capsule (20 mg total) by mouth daily.   ondansetron 4 MG disintegrating tablet Commonly known as: Zofran ODT Take 1 tablet (4 mg total) by mouth every 8 (eight) hours as needed for nausea or  vomiting.   promethazine 25 MG suppository Commonly known as: PHENERGAN Place 1 suppository (25 mg total) rectally every 6 (six) hours as needed for nausea or vomiting.   rosuvastatin 20 MG tablet Commonly known as: Crestor Take 1 tablet (20 mg total) by mouth daily.   sucralfate 1 g tablet Commonly known as: CARAFATE TAKE 1 TABLET (1 G TOTAL) BY MOUTH 4 TIMES A DAY WITH MEALS AND AT BEDTIME         Objective:   BP 127/78   Pulse (!) 101   Ht 6\' 5"  (1.956 m)   Wt (!) 319 lb (144.7 kg)   SpO2 96%   BMI 37.83 kg/m   Wt Readings from Last 3 Encounters:  03/01/23 (!) 319 lb (144.7 kg)  12/12/22 (!) 315 lb  (142.9 kg)  11/28/22 (!) 320 lb (145.2 kg)    Physical Exam Vitals and nursing note reviewed.  Constitutional:      General: He is not in acute distress.    Appearance: He is well-developed. He is not diaphoretic.  Eyes:     General: No scleral icterus.    Conjunctiva/sclera: Conjunctivae normal.  Neck:     Thyroid: No thyromegaly.  Cardiovascular:     Rate and Rhythm: Normal rate and regular rhythm.     Heart sounds: Normal heart sounds. No murmur heard. Pulmonary:     Effort: Pulmonary effort is normal. No respiratory distress.     Breath sounds: Normal breath sounds. No wheezing.  Musculoskeletal:        General: Normal range of motion.     Cervical back: Neck supple.  Lymphadenopathy:     Cervical: No cervical adenopathy.  Skin:    General: Skin is warm and dry.     Findings: No rash.  Neurological:     Mental Status: He is alert and oriented to person, place, and time.     Coordination: Coordination normal.  Psychiatric:        Behavior: Behavior normal.     A1C 9.0.  Assessment & Plan:   Problem List Items Addressed This Visit       Endocrine   Type 2 diabetes mellitus with other specified complication (HCC) - Primary   Relevant Medications   empagliflozin (JARDIANCE) 25 MG TABS tablet   Other Relevant Orders   Bayer DCA Hb A1c Waived   Hyperlipidemia associated with type 2 diabetes mellitus (HCC)   Relevant Medications   empagliflozin (JARDIANCE) 25 MG TABS tablet   Uncontrolled type 2 diabetes mellitus with hyperglycemia, without long-term current use of insulin (HCC)   Relevant Medications   empagliflozin (JARDIANCE) 25 MG TABS tablet    A1c improved but still need help with medicine, will try Jardiance  Blood pressure and everything else looks good, continue with diet as well Follow up plan: Return in about 3 months (around 06/01/2023), or if symptoms worsen or fail to improve, for Diabetes.  Counseling provided for all of the vaccine  components Orders Placed This Encounter  Procedures   Bayer DCA Hb A1c Waived    Arville Care, MD [facility] 03/01/2023, 4:06 PM

## 2023-03-12 ENCOUNTER — Other Ambulatory Visit: Payer: Self-pay | Admitting: Family Medicine

## 2023-03-12 DIAGNOSIS — E1169 Type 2 diabetes mellitus with other specified complication: Secondary | ICD-10-CM

## 2023-03-12 DIAGNOSIS — E1165 Type 2 diabetes mellitus with hyperglycemia: Secondary | ICD-10-CM

## 2023-03-12 MED ORDER — EMPAGLIFLOZIN 25 MG PO TABS
25.0000 mg | ORAL_TABLET | Freq: Every day | ORAL | 3 refills | Status: DC
Start: 2023-03-12 — End: 2023-10-30

## 2023-05-24 DIAGNOSIS — N529 Male erectile dysfunction, unspecified: Secondary | ICD-10-CM | POA: Diagnosis not present

## 2023-06-06 ENCOUNTER — Encounter: Payer: Self-pay | Admitting: Family Medicine

## 2023-06-06 ENCOUNTER — Ambulatory Visit: Payer: BC Managed Care – PPO | Admitting: Family Medicine

## 2023-06-06 VITALS — BP 121/82 | HR 72 | Ht 77.0 in | Wt 318.0 lb

## 2023-06-06 DIAGNOSIS — E1165 Type 2 diabetes mellitus with hyperglycemia: Secondary | ICD-10-CM | POA: Diagnosis not present

## 2023-06-06 DIAGNOSIS — E1169 Type 2 diabetes mellitus with other specified complication: Secondary | ICD-10-CM | POA: Diagnosis not present

## 2023-06-06 DIAGNOSIS — I10 Essential (primary) hypertension: Secondary | ICD-10-CM | POA: Diagnosis not present

## 2023-06-06 DIAGNOSIS — Z7984 Long term (current) use of oral hypoglycemic drugs: Secondary | ICD-10-CM

## 2023-06-06 DIAGNOSIS — E785 Hyperlipidemia, unspecified: Secondary | ICD-10-CM

## 2023-06-06 LAB — BAYER DCA HB A1C WAIVED: HB A1C (BAYER DCA - WAIVED): 7.3 % — ABNORMAL HIGH (ref 4.8–5.6)

## 2023-06-06 NOTE — Progress Notes (Signed)
BP 121/82   Pulse 72   Ht 6\' 5"  (1.956 m)   Wt (!) 318 lb (144.2 kg)   SpO2 96%   BMI 37.71 kg/m    Subjective:   Patient ID: Thomas [name], male    DOB: 09/03/1981, 42 y.o.   MRN: [id number]  HPI: Thomas [name] is a 42 y.o. male presenting on 06/06/2023 for Medical Management of Chronic Issues and Diabetes   HPI Type 2 diabetes mellitus Patient comes in today for recheck of his diabetes. Patient has been currently taking Jardiance and metformin. Patient is currently on an ACE inhibitor/ARB. Patient has not seen an ophthalmologist this year. Patient denies any new issues with their feet. The symptom started onset as an adult hypertension and hyperlipidemia ARE RELATED TO DM   Hyperlipidemia Patient is coming in for recheck of his hyperlipidemia. The patient is currently taking Crestor. They deny any issues with myalgias or history of liver damage from it. They deny any focal numbness or weakness or chest pain.   Hypertension Patient is currently on losartan, and their blood pressure today is 121/82. Patient denies any lightheadedness or dizziness. Patient denies headaches, blurred vision, chest pains, shortness of breath, or weakness. Denies any side effects from medication and is content with current medication.   Relevant past medical, surgical, family and social history reviewed and updated as indicated. Interim medical history since our last visit reviewed. Allergies and medications reviewed and updated.  Review of Systems  Constitutional:  Negative for chills and fever.  Eyes:  Negative for visual disturbance.  Respiratory:  Negative for shortness of breath and wheezing.   Cardiovascular:  Negative for chest pain and leg swelling.  Musculoskeletal:  Negative for back pain and gait problem.  Skin:  Negative for rash.  Neurological:  Negative for dizziness and light-headedness.  All other systems reviewed and are negative.   Per HPI unless specifically indicated  above   Allergies as of 06/06/2023       Reactions   Amoxicillin Hives, Rash   Penicillins Hives, Rash        Medication List        Accurate as of June 06, 2023  4:12 PM. If you have any questions, ask your nurse or doctor.          STOP taking these medications    FreeStyle Libre 3 Sensor Misc Stopped by: Elige Radon Dionte [place]in       TAKE these medications    empagliflozin 25 MG Tabs tablet Commonly known as: Jardiance Take 1 tablet (25 mg total) by mouth daily before breakfast.   fenofibrate 145 MG tablet Commonly known as: TRICOR Take 1 tablet (145 mg total) by mouth daily.   fluticasone 50 MCG/ACT nasal spray Commonly known as: FLONASE Place 2 sprays into both nostrils daily.   losartan 50 MG tablet Commonly known as: COZAAR Take 1 tablet (50 mg total) by mouth daily.   metFORMIN 1000 MG tablet Commonly known as: GLUCOPHAGE Take 1 tablet (1,000 mg total) by mouth 2 (two) times daily with a meal.   multivitamin with minerals Tabs tablet Take 1 tablet by mouth daily.   omeprazole 20 MG capsule Commonly known as: PRILOSEC Take 1 capsule (20 mg total) by mouth daily.   ondansetron 4 MG disintegrating tablet Commonly known as: Zofran ODT Take 1 tablet (4 mg total) by mouth every 8 (eight) hours as needed for nausea or vomiting.   promethazine 25 MG suppository Commonly known as:  PHENERGAN Place 1 suppository (25 mg total) rectally every 6 (six) hours as needed for nausea or vomiting.   rosuvastatin 20 MG tablet Commonly known as: Crestor Take 1 tablet (20 mg total) by mouth daily.   sucralfate 1 g tablet Commonly known as: CARAFATE TAKE 1 TABLET (1 G TOTAL) BY MOUTH 4 TIMES A DAY WITH MEALS AND AT BEDTIME         Objective:   BP 121/82   Pulse 72   Ht 6\' 5"  (1.956 m)   Wt (!) 318 lb (144.2 kg)   SpO2 96%   BMI 37.71 kg/m   Wt Readings from Last 3 Encounters:  06/06/23 (!) 318 lb (144.2 kg)  03/01/23 (!) 319 lb (144.7 kg)   12/12/22 (!) 315 lb (142.9 kg)    Physical Exam Vitals and nursing note reviewed.  Constitutional:      General: He is not in acute distress.    Appearance: He is well-developed. He is not diaphoretic.  Eyes:     General: No scleral icterus.    Conjunctiva/sclera: Conjunctivae normal.  Neck:     Thyroid: No thyromegaly.  Cardiovascular:     Rate and Rhythm: Normal rate and regular rhythm.     Heart sounds: Normal heart sounds. No murmur heard. Pulmonary:     Effort: Pulmonary effort is normal. No respiratory distress.     Breath sounds: Normal breath sounds. No wheezing.  Musculoskeletal:        General: No swelling. Normal range of motion.     Cervical back: Neck supple.  Lymphadenopathy:     Cervical: No cervical adenopathy.  Skin:    General: Skin is warm and dry.     Findings: No rash.  Neurological:     Mental Status: He is alert and oriented to person, place, and time.     Coordination: Coordination normal.  Psychiatric:        Behavior: Behavior normal.       Assessment & Plan:   Problem List Items Addressed This Visit       Cardiovascular and Mediastinum   Essential hypertension     Endocrine   Type 2 diabetes mellitus with other specified complication (HCC) - Primary   Relevant Orders   CBC with Differential/Platelet   CMP14+EGFR   Lipid panel   Bayer DCA Hb A1c Waived   Hyperlipidemia associated with type 2 diabetes mellitus (HCC)   Uncontrolled type 2 diabetes mellitus with hyperglycemia, without long-term current use of insulin (HCC)    He has had trouble with freestyle libre sensor falling off, he will try it just on the inside of his arm and see if it does better.  A1c is 7.3, keep up the good work and it is improving and keep current medicines Follow up plan: Return in about 3 months (around 09/05/2023), or if symptoms worsen or fail to improve, for diabetes and hypertension.  Counseling provided for all of the vaccine components Orders  Placed This Encounter  Procedures   CBC with Differential/Platelet   CMP14+EGFR   Lipid panel   Bayer DCA Hb A1c Waived    Arville Care, MD [facility] 06/06/2023, 4:12 PM

## 2023-06-07 LAB — CMP14+EGFR
ALT: 23 IU/L (ref 0–44)
AST: 16 IU/L (ref 0–40)
Albumin: 4.5 g/dL (ref 4.1–5.1)
Alkaline Phosphatase: 61 IU/L (ref 44–121)
BUN/Creatinine Ratio: 17 (ref 9–20)
BUN: 18 mg/dL (ref 6–24)
Bilirubin Total: 0.3 mg/dL (ref 0.0–1.2)
CO2: 23 mmol/L (ref 20–29)
Calcium: 9.4 mg/dL (ref 8.7–10.2)
Chloride: 104 mmol/L (ref 96–106)
Creatinine, Ser: 1.05 mg/dL (ref 0.76–1.27)
Globulin, Total: 2.2 g/dL (ref 1.5–4.5)
Glucose: 115 mg/dL — ABNORMAL HIGH (ref 70–99)
Potassium: 4.1 mmol/L (ref 3.5–5.2)
Sodium: 141 mmol/L (ref 134–144)
Total Protein: 6.7 g/dL (ref 6.0–8.5)
eGFR: 92 mL/min/{1.73_m2} (ref >59)

## 2023-06-07 LAB — CBC WITH DIFFERENTIAL/PLATELET
Basophils Absolute: 0.1 10*3/uL (ref 0.0–0.2)
Basos: 1 %
EOS (ABSOLUTE): 0.2 10*3/uL (ref 0.0–0.4)
Eos: 2 %
Hematocrit: 44.8 % (ref 37.5–51.0)
Hemoglobin: 14.2 g/dL (ref 13.0–17.7)
Immature Grans (Abs): 0 10*3/uL (ref 0.0–0.1)
Immature Granulocytes: 0 %
Lymphocytes Absolute: 2.8 10*3/uL (ref 0.7–3.1)
Lymphs: 37 %
MCH: 25.7 pg — ABNORMAL LOW (ref 26.6–33.0)
MCHC: 31.7 g/dL (ref 31.5–35.7)
MCV: 81 fL (ref 79–97)
Monocytes Absolute: 0.6 10*3/uL (ref 0.1–0.9)
Monocytes: 9 %
Neutrophils Absolute: 3.9 10*3/uL (ref 1.4–7.0)
Neutrophils: 51 %
Platelets: 314 10*3/uL (ref 150–450)
RBC: 5.52 x10E6/uL (ref 4.14–5.80)
RDW: 13.6 % (ref 11.6–15.4)
WBC: 7.6 10*3/uL (ref 3.4–10.8)

## 2023-06-07 LAB — LIPID PANEL
Chol/HDL Ratio: 4.4 ratio (ref 0.0–5.0)
Cholesterol, Total: 142 mg/dL (ref 100–199)
HDL: 32 mg/dL — ABNORMAL LOW (ref >39)
LDL Chol Calc (NIH): 78 mg/dL (ref 0–99)
Triglycerides: 191 mg/dL — ABNORMAL HIGH (ref 0–149)
VLDL Cholesterol Cal: 32 mg/dL (ref 5–40)

## 2023-08-28 ENCOUNTER — Telehealth: Payer: Self-pay | Admitting: Family Medicine

## 2023-09-12 ENCOUNTER — Ambulatory Visit: Payer: BC Managed Care – PPO | Admitting: Family Medicine

## 2023-10-30 ENCOUNTER — Ambulatory Visit: Payer: BC Managed Care – PPO | Admitting: Family Medicine

## 2023-10-30 ENCOUNTER — Encounter: Payer: Self-pay | Admitting: Family Medicine

## 2023-10-30 VITALS — BP 136/85 | HR 90 | Ht 77.0 in | Wt 325.0 lb

## 2023-10-30 DIAGNOSIS — E1159 Type 2 diabetes mellitus with other circulatory complications: Secondary | ICD-10-CM

## 2023-10-30 DIAGNOSIS — K219 Gastro-esophageal reflux disease without esophagitis: Secondary | ICD-10-CM

## 2023-10-30 DIAGNOSIS — Z7984 Long term (current) use of oral hypoglycemic drugs: Secondary | ICD-10-CM

## 2023-10-30 DIAGNOSIS — E1169 Type 2 diabetes mellitus with other specified complication: Secondary | ICD-10-CM

## 2023-10-30 DIAGNOSIS — E785 Hyperlipidemia, unspecified: Secondary | ICD-10-CM

## 2023-10-30 DIAGNOSIS — E1165 Type 2 diabetes mellitus with hyperglycemia: Secondary | ICD-10-CM

## 2023-10-30 DIAGNOSIS — Z23 Encounter for immunization: Secondary | ICD-10-CM

## 2023-10-30 DIAGNOSIS — E781 Pure hyperglyceridemia: Secondary | ICD-10-CM | POA: Diagnosis not present

## 2023-10-30 DIAGNOSIS — I10 Essential (primary) hypertension: Secondary | ICD-10-CM

## 2023-10-30 LAB — BAYER DCA HB A1C WAIVED: HB A1C (BAYER DCA - WAIVED): 8.2 % — ABNORMAL HIGH (ref 4.8–5.6)

## 2023-10-30 MED ORDER — ROSUVASTATIN CALCIUM 20 MG PO TABS: 20.0000 mg | ORAL_TABLET | Freq: Every day | ORAL | 3 refills | Status: DC

## 2023-10-30 MED ORDER — RYBELSUS 7 MG PO TABS: 7.0000 mg | ORAL_TABLET | Freq: Every day | ORAL | 3 refills | Status: DC

## 2023-10-30 MED ORDER — OMEPRAZOLE 20 MG PO CPDR: 20.0000 mg | DELAYED_RELEASE_CAPSULE | Freq: Every day | ORAL | 3 refills | Status: DC

## 2023-10-30 MED ORDER — LOSARTAN POTASSIUM 50 MG PO TABS: 50.0000 mg | ORAL_TABLET | Freq: Every day | ORAL | 3 refills | Status: DC

## 2023-10-30 MED ORDER — METFORMIN HCL 1000 MG PO TABS
1000.0000 mg | ORAL_TABLET | Freq: Two times a day (BID) | ORAL | 3 refills | Status: DC
Start: 2023-10-30 — End: 2024-08-05

## 2023-10-30 MED ORDER — FENOFIBRATE 145 MG PO TABS: 145.0000 mg | ORAL_TABLET | Freq: Every day | ORAL | 3 refills | Status: DC

## 2023-10-30 MED ORDER — EMPAGLIFLOZIN 25 MG PO TABS: 25.0000 mg | ORAL_TABLET | Freq: Every day | ORAL | 3 refills | Status: DC

## 2023-10-30 NOTE — Progress Notes (Signed)
BP 136/85   Pulse 90   Ht 6\' 5"  (1.956 m)   Wt (!) 325 lb (147.4 kg)   SpO2 98%   BMI 38.54 kg/m    Subjective:   Patient ID: Thomas [name], male    DOB: March 24, 1981, 43 y.o.   MRN: [id number]  HPI: Thomas [name] is a 43 y.o. male presenting on 10/30/2023 for Medical Management of Chronic Issues and Diabetes   HPI Type 2 diabetes mellitus Patient comes in today for recheck of his diabetes. Patient has been currently taking Jardiance and metformin. Patient is currently on an ACE inhibitor/ARB. Patient has not seen an ophthalmologist this year. Patient denies any new issues with their feet. The symptom started onset as an adult hypertension that hyperlipidemia ARE RELATED TO DM   Hyperlipidemia Patient is coming in for recheck of his hyperlipidemia. The patient is currently taking fenofibrate and rosuvastatin. They deny any issues with myalgias or history of liver damage from it. They deny any focal numbness or weakness or chest pain.   Hypertension Patient is currently on losartan, and their blood pressure today is 136/85. Patient denies any lightheadedness or dizziness. Patient denies headaches, blurred vision, chest pains, shortness of breath, or weakness. Denies any side effects from medication and is content with current medication.   Relevant past medical, surgical, family and social history reviewed and updated as indicated. Interim medical history since our last visit reviewed. Allergies and medications reviewed and updated.  Review of Systems  Constitutional:  Negative for chills and fever.  Eyes:  Negative for visual disturbance.  Respiratory:  Negative for shortness of breath and wheezing.   Cardiovascular:  Negative for chest pain and leg swelling.  Musculoskeletal:  Negative for back pain and gait problem.  Skin:  Negative for rash.  Neurological:  Negative for dizziness, weakness and numbness.  All other systems reviewed and are negative.   Per HPI unless  specifically indicated above   Allergies as of 10/30/2023       Reactions   Amoxicillin Hives, Rash   Penicillins Hives, Rash        Medication List        Accurate as of October 30, 2023  4:27 PM. If you have any questions, ask your nurse or doctor.          empagliflozin 25 MG Tabs tablet Commonly known as: Jardiance Take 1 tablet (25 mg total) by mouth daily before breakfast.   fenofibrate 145 MG tablet Commonly known as: TRICOR Take 1 tablet (145 mg total) by mouth daily.   fluticasone 50 MCG/ACT nasal spray Commonly known as: FLONASE Place 2 sprays into both nostrils daily.   losartan 50 MG tablet Commonly known as: COZAAR Take 1 tablet (50 mg total) by mouth daily.   metFORMIN 1000 MG tablet Commonly known as: GLUCOPHAGE Take 1 tablet (1,000 mg total) by mouth 2 (two) times daily with a meal.   multivitamin with minerals Tabs tablet Take 1 tablet by mouth daily.   omeprazole 20 MG capsule Commonly known as: PRILOSEC Take 1 capsule (20 mg total) by mouth daily.   ondansetron 4 MG disintegrating tablet Commonly known as: Zofran ODT Take 1 tablet (4 mg total) by mouth every 8 (eight) hours as needed for nausea or vomiting.   promethazine 25 MG suppository Commonly known as: PHENERGAN Place 1 suppository (25 mg total) rectally every 6 (six) hours as needed for nausea or vomiting.   rosuvastatin 20 MG tablet Commonly known  as: Crestor Take 1 tablet (20 mg total) by mouth daily.   Rybelsus 7 MG Tabs Generic drug: Semaglutide Take 1 tablet (7 mg total) by mouth daily. Started by: Elige Radon Emmaus Brandi   sucralfate 1 g tablet Commonly known as: CARAFATE TAKE 1 TABLET (1 G TOTAL) BY MOUTH 4 TIMES A DAY WITH MEALS AND AT BEDTIME         Objective:   BP 136/85   Pulse 90   Ht 6\' 5"  (1.956 m)   Wt (!) 325 lb (147.4 kg)   SpO2 98%   BMI 38.54 kg/m   Wt Readings from Last 3 Encounters:  10/30/23 (!) 325 lb (147.4 kg)  06/06/23 (!) 318 lb  (144.2 kg)  03/01/23 (!) 319 lb (144.7 kg)    Physical Exam Vitals and nursing note reviewed.  Constitutional:      General: He is not in acute distress.    Appearance: He is well-developed. He is not diaphoretic.  Eyes:     General: No scleral icterus.    Conjunctiva/sclera: Conjunctivae normal.  Neck:     Thyroid: No thyromegaly.  Cardiovascular:     Rate and Rhythm: Normal rate and regular rhythm.     Heart sounds: Normal heart sounds. No murmur heard. Pulmonary:     Effort: Pulmonary effort is normal. No respiratory distress.     Breath sounds: Normal breath sounds. No wheezing.  Musculoskeletal:        General: No swelling. Normal range of motion.     Cervical back: Neck supple.  Lymphadenopathy:     Cervical: No cervical adenopathy.  Skin:    General: Skin is warm and dry.     Findings: No rash.  Neurological:     Mental Status: He is alert and oriented to person, place, and time.     Coordination: Coordination normal.  Psychiatric:        Behavior: Behavior normal.       Assessment & Plan:   Problem List Items Addressed This Visit       Cardiovascular and Mediastinum   Essential hypertension   Relevant Medications   fenofibrate (TRICOR) 145 MG tablet   losartan (COZAAR) 50 MG tablet   rosuvastatin (CRESTOR) 20 MG tablet   Other Relevant Orders   Amb ref to Medical Nutrition Therapy-MNT     Endocrine   Type 2 diabetes mellitus with other specified complication (HCC) - Primary   Relevant Medications   losartan (COZAAR) 50 MG tablet   metFORMIN (GLUCOPHAGE) 1000 MG tablet   empagliflozin (JARDIANCE) 25 MG TABS tablet   rosuvastatin (CRESTOR) 20 MG tablet   Semaglutide (RYBELSUS) 7 MG TABS   Other Relevant Orders   Bayer DCA Hb A1c Waived   Microalbumin / creatinine urine ratio   Amb ref to Medical Nutrition Therapy-MNT   Hyperlipidemia associated with type 2 diabetes mellitus (HCC)   Relevant Medications   fenofibrate (TRICOR) 145 MG tablet    losartan (COZAAR) 50 MG tablet   metFORMIN (GLUCOPHAGE) 1000 MG tablet   empagliflozin (JARDIANCE) 25 MG TABS tablet   rosuvastatin (CRESTOR) 20 MG tablet   Semaglutide (RYBELSUS) 7 MG TABS   Other Relevant Orders   Amb ref to Medical Nutrition Therapy-MNT   Uncontrolled type 2 diabetes mellitus with hyperglycemia, without long-term current use of insulin (HCC)   Relevant Medications   losartan (COZAAR) 50 MG tablet   metFORMIN (GLUCOPHAGE) 1000 MG tablet   empagliflozin (JARDIANCE) 25 MG TABS tablet   rosuvastatin (  CRESTOR) 20 MG tablet   Semaglutide (RYBELSUS) 7 MG TABS   Other Relevant Orders   Amb ref to Medical Nutrition Therapy-MNT   Other Visit Diagnoses       Hypertriglyceridemia       Relevant Medications   fenofibrate (TRICOR) 145 MG tablet   losartan (COZAAR) 50 MG tablet   rosuvastatin (CRESTOR) 20 MG tablet   Other Relevant Orders   Amb ref to Medical Nutrition Therapy-MNT     Gastroesophageal reflux disease without esophagitis       Relevant Medications   omeprazole (PRILOSEC) 20 MG capsule       A1c 8.2.  Gave sample of Rybelsus 3 mg to start and then go up to 7 mg, sent a prescription in for Rybelsus 7 mg, instructed patient to use coupon card to pharmacy. Follow up plan: Return in about 3 months (around 01/28/2024), or if symptoms worsen or fail to improve, for Diabetes recheck.  Counseling provided for all of the vaccine components Orders Placed This Encounter  Procedures   Bayer DCA Hb A1c Waived   Microalbumin / creatinine urine ratio   Amb ref to Medical Nutrition Therapy-MNT    Arville Care, MD [facility] 10/30/2023, 4:27 PM

## 2023-10-31 DIAGNOSIS — Z23 Encounter for immunization: Secondary | ICD-10-CM

## 2023-10-31 LAB — MICROALBUMIN / CREATININE URINE RATIO
Creatinine, Urine: 120.8 mg/dL
Microalb/Creat Ratio: 8 mg/g{creat} (ref 0–29)
Microalbumin, Urine: 9.4 ug/mL

## 2023-11-07 ENCOUNTER — Encounter: Payer: Self-pay | Admitting: Family Medicine

## 2023-11-28 ENCOUNTER — Other Ambulatory Visit: Payer: Self-pay | Admitting: Family Medicine

## 2023-11-28 DIAGNOSIS — E1169 Type 2 diabetes mellitus with other specified complication: Secondary | ICD-10-CM

## 2023-12-04 DIAGNOSIS — E119 Type 2 diabetes mellitus without complications: Secondary | ICD-10-CM | POA: Diagnosis not present

## 2023-12-04 DIAGNOSIS — H35033 Hypertensive retinopathy, bilateral: Secondary | ICD-10-CM | POA: Diagnosis not present

## 2023-12-04 LAB — HM DIABETES EYE EXAM

## 2023-12-12 ENCOUNTER — Encounter: Payer: BC Managed Care – PPO | Attending: Family Medicine | Admitting: Nutrition

## 2023-12-12 ENCOUNTER — Encounter: Payer: Self-pay | Admitting: Nutrition

## 2023-12-12 DIAGNOSIS — I10 Essential (primary) hypertension: Secondary | ICD-10-CM | POA: Diagnosis not present

## 2023-12-12 DIAGNOSIS — E781 Pure hyperglyceridemia: Secondary | ICD-10-CM | POA: Diagnosis not present

## 2023-12-12 DIAGNOSIS — Z713 Dietary counseling and surveillance: Secondary | ICD-10-CM | POA: Insufficient documentation

## 2023-12-12 DIAGNOSIS — E1169 Type 2 diabetes mellitus with other specified complication: Secondary | ICD-10-CM | POA: Diagnosis not present

## 2023-12-12 DIAGNOSIS — E785 Hyperlipidemia, unspecified: Secondary | ICD-10-CM | POA: Diagnosis not present

## 2023-12-12 DIAGNOSIS — E1165 Type 2 diabetes mellitus with hyperglycemia: Secondary | ICD-10-CM | POA: Diagnosis not present

## 2023-12-12 NOTE — Patient Instructions (Signed)
 Goals Established by Pt Eat three meals per day Focus on more whole plant based foods Cut out processed foods Cut out sweets and eating after 7 pm. Increase exericse to 150-225 mins a week. Get A1C to 6.5% or less. Lose 10 lbs in the next 2 months.

## 2023-12-12 NOTE — Progress Notes (Signed)
 Medical Nutrition Therapy  Appointment Start time:  1530  Appointment End time:  1700  Primary concerns today: DM Type 2  Referral diagnosis: E11.8 Preferred learning style: Hands on  Learning readiness: Ready   NUTRITION ASSESSMENT  43 yr old wmale here with his wife. Newly diagnosed Type 2 DM.  Has libre. He is eating much better the last few weeks. Has cut out sodas and a lot of junk food. Josephine Igo shows avg BS 110 now.  GMI is 7%. PCP Dr. Louanne Skye.  Has Hyperlipidemia. TG 191  mg/dl and HDL low at 35. He is on Metformin 1000 mg a day. Is not taking 2000 mg a day as prescribed.Marland Kitchen He takes Jardiance also. Didn't take Rybelsus. Skips breakfast and eats after supper- snacks. BMI 37. Needs to lose weight.  Willing to make further changes and focus on a more plant predominant lifestyle to reverse or improve his DM and CVD.  Clinical Medical Hx:  Past Medical History:  Diagnosis Date   Plantar fasciitis     Medications:  Current Outpatient Medications on File Prior to Visit  Medication Sig Dispense Refill   empagliflozin (JARDIANCE) 25 MG TABS tablet Take 1 tablet (25 mg total) by mouth daily before breakfast. 90 tablet 3   fenofibrate (TRICOR) 145 MG tablet Take 1 tablet (145 mg total) by mouth daily. 90 tablet 3   fluticasone (FLONASE) 50 MCG/ACT nasal spray Place 2 sprays into both nostrils daily. 16 g 0   losartan (COZAAR) 50 MG tablet Take 1 tablet (50 mg total) by mouth daily. 90 tablet 3   metFORMIN (GLUCOPHAGE) 1000 MG tablet Take 1 tablet (1,000 mg total) by mouth 2 (two) times daily with a meal. 180 tablet 3   Multiple Vitamin (MULTIVITAMIN WITH MINERALS) TABS tablet Take 1 tablet by mouth daily.     omeprazole (PRILOSEC) 20 MG capsule Take 1 capsule (20 mg total) by mouth daily. 90 capsule 3   ondansetron (ZOFRAN ODT) 4 MG disintegrating tablet Take 1 tablet (4 mg total) by mouth every 8 (eight) hours as needed for nausea or vomiting. 30 tablet 1   promethazine (PHENERGAN)  25 MG suppository Place 1 suppository (25 mg total) rectally every 6 (six) hours as needed for nausea or vomiting. 12 each 0   rosuvastatin (CRESTOR) 20 MG tablet Take 1 tablet (20 mg total) by mouth daily. 90 tablet 3   Semaglutide (RYBELSUS) 7 MG TABS Take 1 tablet (7 mg total) by mouth daily. 30 tablet 3   sucralfate (CARAFATE) 1 g tablet TAKE 1 TABLET (1 G TOTAL) BY MOUTH 4 TIMES A DAY WITH MEALS AND AT BEDTIME 90 tablet 0   No current facility-administered medications on file prior to visit.    Labs:  Lab Results  Component Value Date   HGBA1C 8.2 (H) 10/30/2023      Latest Ref Rng & Units 06/06/2023    3:24 PM 11/28/2022    3:27 PM 04/04/2021   12:48 PM  CMP  Glucose 70 - 99 mg/dL 413  244  010   BUN 6 - 24 mg/dL 18  18  15    Creatinine 0.76 - 1.27 mg/dL 2.72  5.36  6.44   Sodium 134 - 144 mmol/L 141  138  136   Potassium 3.5 - 5.2 mmol/L 4.1  4.1  4.5   Chloride 96 - 106 mmol/L 104  99  98   CO2 20 - 29 mmol/L 23  23  22    Calcium 8.7 -  10.2 mg/dL 9.4  9.4  9.3   Total Protein 6.0 - 8.5 g/dL 6.7  6.7  6.7   Total Bilirubin 0.0 - 1.2 mg/dL 0.3  0.7  0.3   Alkaline Phos 44 - 121 IU/L 61  73  95   AST 0 - 40 IU/L 16  16  16    ALT 0 - 44 IU/L 23  16  24     Lipid Panel     Component Value Date/Time   CHOL 142 06/06/2023 1524   TRIG 191 (H) 06/06/2023 1524   HDL 32 (L) 06/06/2023 1524   CHOLHDL 4.4 06/06/2023 1524   LDLCALC 78 06/06/2023 1524   LABVLDL 32 06/06/2023 1524    Notable Signs/Symptoms: tired,   Lifestyle & Dietary Hx Lives with his wife He cooks often. Use to eat out a lot more.  Estimated daily fluid intake: 80 oz Supplements:  Sleep: varies Stress / self-care:  Current average weekly physical activity: walks some  24-Hr Dietary Recall Eats 2 meals per day.. Skips breakfast often.. Trying to eat better balance meals.  Estimated Energy Needs Calories: 1800-2000 Carbohydrate: 200g Protein: 135g Fat: 50g   NUTRITION DIAGNOSIS  NB-1.1 Food and  nutrition-related knowledge deficit As related to DM Type 2.  As evidenced by A1C 8.2%.Marland Kitchen   NUTRITION INTERVENTION  Nutrition education (E-1) on the following topics:   Nutrition and Diabetes education provided on My Plate, CHO counting, meal planning, portion sizes, timing of meals, avoiding snacks between meals unless having a low blood sugar, target ranges for A1C and blood sugars, signs/symptoms and treatment of hyper/hypoglycemia, monitoring blood sugars, taking medications as prescribed, benefits of exercising 30 minutes per day and prevention of complications of DM. Lifestyle Medicine  - Whole Food, Plant Predominant Nutrition is highly recommended: Eat Plenty of vegetables, Mushrooms, fruits, Legumes, Whole Grains, Nuts, seeds in lieu of processed meats, processed snacks/pastries red meat, poultry, eggs.    -It is better to avoid simple carbohydrates including: Cakes, Sweet Desserts, Ice Cream, Soda (diet and regular), Sweet Tea, Candies, Chips, Cookies, Store Bought Juices, Alcohol in Excess of  1-2 drinks a day, Lemonade,  Artificial Sweeteners, Doughnuts, Coffee Creamers, "Sugar-free" Products, etc, etc.  This is not a complete list.....  Exercise: If you are able: 30 -60 minutes a day ,4 days a week, or 150 minutes a week.  The longer the better.  Combine stretch, strength, and aerobic activities.  If you were told in the past that you have high risk for cardiovascular diseases, you may seek evaluation by your heart doctor prior to initiating moderate to intense exercise programs.   Handouts Provided Include  Lifestyle Medicine Handouts   Learning Style & Readiness for Change Teaching method utilized: Visual & Auditory  Demonstrated degree of understanding via: Teach Back  Barriers to learning/adherence to lifestyle change: None  Goals Established by Pt Eat three meals per day Focus on more whole plant based foods Cut out processed foods Cut out sweets and eating after 7  pm. Increase exericse to 150-225 mins a week. Get A1C to 6.5% or less. Lose 10 lbs in the next 2 months.   MONITORING & EVALUATION Dietary intake, weekly physical activity, and blood sugars in 1 month.  Next Steps  Patient is to work on meal planning and exercise.Marland Kitchen

## 2023-12-16 ENCOUNTER — Other Ambulatory Visit: Payer: Self-pay | Admitting: Family Medicine

## 2023-12-16 DIAGNOSIS — E1169 Type 2 diabetes mellitus with other specified complication: Secondary | ICD-10-CM

## 2024-01-03 ENCOUNTER — Telehealth: Payer: Self-pay | Admitting: Family Medicine

## 2024-01-14 ENCOUNTER — Encounter: Payer: Self-pay | Admitting: *Deleted

## 2024-01-14 ENCOUNTER — Encounter: Payer: Self-pay | Admitting: Family Medicine

## 2024-01-14 ENCOUNTER — Other Ambulatory Visit: Payer: Self-pay | Admitting: Family Medicine

## 2024-01-14 ENCOUNTER — Ambulatory Visit: Admitting: Family Medicine

## 2024-01-14 ENCOUNTER — Ambulatory Visit: Admitting: Nurse Practitioner

## 2024-01-14 VITALS — BP 138/95 | HR 112 | Temp 97.2°F | Ht 77.0 in | Wt 317.2 lb

## 2024-01-14 DIAGNOSIS — R0989 Other specified symptoms and signs involving the circulatory and respiratory systems: Secondary | ICD-10-CM

## 2024-01-14 DIAGNOSIS — R0981 Nasal congestion: Secondary | ICD-10-CM | POA: Diagnosis not present

## 2024-01-14 DIAGNOSIS — J069 Acute upper respiratory infection, unspecified: Secondary | ICD-10-CM

## 2024-01-14 DIAGNOSIS — R062 Wheezing: Secondary | ICD-10-CM

## 2024-01-14 DIAGNOSIS — E1165 Type 2 diabetes mellitus with hyperglycemia: Secondary | ICD-10-CM | POA: Diagnosis not present

## 2024-01-14 DIAGNOSIS — Z7984 Long term (current) use of oral hypoglycemic drugs: Secondary | ICD-10-CM

## 2024-01-14 LAB — VERITOR FLU A/B WAIVED
Influenza A: NEGATIVE
Influenza B: NEGATIVE

## 2024-01-14 MED ORDER — DOXYCYCLINE HYCLATE 100 MG PO TABS: 100.0000 mg | ORAL_TABLET | Freq: Two times a day (BID) | ORAL | 0 refills | Status: AC

## 2024-01-14 MED ORDER — ALBUTEROL SULFATE HFA 108 (90 BASE) MCG/ACT IN AERS: 2.0000 | INHALATION_SPRAY | Freq: Four times a day (QID) | RESPIRATORY_TRACT | 2 refills | Status: AC | PRN

## 2024-01-14 MED ORDER — AZITHROMYCIN 250 MG PO TABS: ORAL_TABLET | ORAL | 0 refills | Status: AC

## 2024-01-14 NOTE — Progress Notes (Signed)
 Subjective:  Patient ID: Thomas [name], male    DOB: 1981-01-14, 43 y.o.   MRN: [id number]  Patient Care Team: Dettinger, Elige Radon, MD as PCP - General (Family Medicine)   Chief Complaint:  Nasal Congestion, Fever, Generalized Body Aches, and Cough (States it started last Thursday but got worse Saturday )   HPI: Thomas [name] is a 42 y.o. male presenting on 01/14/2024 for Nasal Congestion, Fever, Generalized Body Aches, and Cough (States it started last Thursday but got worse Saturday )   Discussed the use of AI scribe software for clinical note transcription with the patient, who gave verbal consent to proceed.  History of Present Illness   He is a 43 year old male with diabetes who presents with fever, fatigue, and respiratory symptoms.  He has been feeling unwell since Thursday, initially experiencing chest congestion. He took some emergency medication but is unsure of any other treatments at that time. By Friday, he expected to feel worse but did not. On Saturday, after taking a child hunting, he felt very fatigued. Sunday brought a fever, joint aches, body aches, and increased fatigue after attending church.  On Monday, he felt better in the morning with reduced aching but remained congested. However, by the afternoon, his fever returned and persisted throughout the night, despite alternating between ibuprofen and Tylenol to manage it. He has not been sleeping well due to the fever and has been sweating profusely since sunrise on Tuesday.  He describes a cough and has started to blow his nose, which he was unable to do until Tuesday. He has been coughing up yellow sputum. He has been taking Mucinex and an antibiotic, levofloxacin, for two days, which was left over from a previous illness. His temperature has been around 99.6 to 99.45F. No ear pain and he has not been around anyone known to be sick.  He has a history of pneumonia during a previous COVID-19 infection, which was  severe. He notes that lying on his right side exacerbates his symptoms, while lying on his left side alleviates them.  He has diabetes and mentions that his blood sugar levels have been relatively stable, ranging from 115 to 120 mg/dL, occasionally rising to 180 mg/dL after meals. His glucose sensor recently fell out, and he is in the process of obtaining a replacement.          Relevant past medical, surgical, family, and social history reviewed and updated as indicated.  Allergies and medications reviewed and updated. Data reviewed: Chart in Epic.   Past Medical History:  Diagnosis Date   Plantar fasciitis     History reviewed. No pertinent surgical history.  Social History   Socioeconomic History   Marital status: Married    Spouse name: Not on file   Number of children: Not on file   Years of education: Not on file   Highest education level: Not on file  Occupational History   Not on file  Tobacco Use   Smoking status: Every Day    Current packs/day: 0.25    Types: Cigarettes   Smokeless tobacco: Never  Vaping Use   Vaping status: Never Used  Substance and Sexual Activity   Alcohol use: Yes    Comment: occasional   Drug use: No   Sexual activity: Not on file  Other Topics Concern   Not on file  Social History Narrative   Not on file   Social Drivers of Health   Financial Resource Strain: Not  on file  Food Insecurity: Not on file  Transportation Needs: Not on file  Physical Activity: Not on file  Stress: Not on file  Social Connections: Unknown (02/09/2022)   Received from [facility], [facility]   Social Network    Social Network: Not on file  Intimate Partner Violence: Unknown (01/03/2022)   Received from [facility], [facility]   HITS    Physically Hurt: Not on file    Insult or Talk Down To: Not on file    Threaten Physical Harm: Not on file    Scream or Curse: Not on file    Outpatient Encounter Medications as of 01/14/2024  Medication  Sig   albuterol (VENTOLIN HFA) 108 (90 Base) MCG/ACT inhaler Inhale 2 puffs into the lungs every 6 (six) hours as needed for wheezing or shortness of breath.   azithromycin (ZITHROMAX) 250 MG tablet Take 2 tablets on day 1, then 1 tablet daily on days 2 through 5   Continuous Glucose Sensor (FREESTYLE LIBRE 3 SENSOR) MISC PLACE 1 SENSOR ONTO THE SKIN EVERY 14 DAYS TO CHECK GLUCOSE   doxycycline (VIBRA-TABS) 100 MG tablet Take 1 tablet (100 mg total) by mouth 2 (two) times daily for 7 days. 1 po bid   empagliflozin (JARDIANCE) 25 MG TABS tablet Take 1 tablet (25 mg total) by mouth daily before breakfast.   fenofibrate (TRICOR) 145 MG tablet Take 1 tablet (145 mg total) by mouth daily.   fluticasone (FLONASE) 50 MCG/ACT nasal spray Place 2 sprays into both nostrils daily.   losartan (COZAAR) 50 MG tablet Take 1 tablet (50 mg total) by mouth daily.   metFORMIN (GLUCOPHAGE) 1000 MG tablet Take 1 tablet (1,000 mg total) by mouth 2 (two) times daily with a meal.   Multiple Vitamin (MULTIVITAMIN WITH MINERALS) TABS tablet Take 1 tablet by mouth daily.   omeprazole (PRILOSEC) 20 MG capsule Take 1 capsule (20 mg total) by mouth daily.   rosuvastatin (CRESTOR) 10 MG tablet TAKE 1 TABLET BY MOUTH EVERY DAY   rosuvastatin (CRESTOR) 20 MG tablet Take 1 tablet (20 mg total) by mouth daily.   Semaglutide (RYBELSUS) 7 MG TABS Take 1 tablet (7 mg total) by mouth daily.   sucralfate (CARAFATE) 1 g tablet TAKE 1 TABLET (1 G TOTAL) BY MOUTH 4 TIMES A DAY WITH MEALS AND AT BEDTIME   ondansetron (ZOFRAN ODT) 4 MG disintegrating tablet Take 1 tablet (4 mg total) by mouth every 8 (eight) hours as needed for nausea or vomiting. (Patient not taking: Reported on 01/14/2024)   promethazine (PHENERGAN) 25 MG suppository Place 1 suppository (25 mg total) rectally every 6 (six) hours as needed for nausea or vomiting. (Patient not taking: Reported on 01/14/2024)   No facility-administered encounter medications on file as of  01/14/2024.    Allergies  Allergen Reactions   Penicillins Hives, Rash and Other (See Comments)    As a child   Amoxicillin Hives and Rash    Pertinent ROS per HPI, otherwise unremarkable      Objective:  BP (!) 138/95   Pulse (!) 112   Temp (!) 97.2 F (36.2 C)   Ht 6\' 5"  (1.956 m)   Wt (!) 317 lb 3.2 oz (143.9 kg)   SpO2 95%   BMI 37.61 kg/m    Wt Readings from Last 3 Encounters:  01/14/24 (!) 317 lb 3.2 oz (143.9 kg)  12/12/23 (!) 320 lb (145.2 kg)  10/30/23 (!) 325 lb (147.4 kg)    Physical  Exam Vitals and nursing note reviewed.  Constitutional:      General: He is not in acute distress.    Appearance: Normal appearance. He is obese. He is ill-appearing. He is not toxic-appearing or diaphoretic.  HENT:     Head: Normocephalic and atraumatic.     Right Ear: Tympanic membrane, ear canal and external ear normal.     Left Ear: Tympanic membrane, ear canal and external ear normal.     Nose: Congestion present.     Mouth/Throat:     Mouth: Mucous membranes are moist.     Pharynx: Posterior oropharyngeal erythema present. No oropharyngeal exudate.  Eyes:     Conjunctiva/sclera: Conjunctivae normal.     Pupils: Pupils are equal, round, and reactive to light.  Cardiovascular:     Rate and Rhythm: Normal rate and regular rhythm.     Heart sounds: Normal heart sounds.  Pulmonary:     Effort: Pulmonary effort is normal.     Breath sounds: Wheezing (RUL) and rhonchi (RUL) present.  Musculoskeletal:     Cervical back: Neck supple.     Right lower leg: No edema.     Left lower leg: No edema.  Lymphadenopathy:     Cervical: No cervical adenopathy.  Skin:    General: Skin is warm and moist.     Capillary Refill: Capillary refill takes less than 2 seconds.  Neurological:     General: No focal deficit present.     Mental Status: He is alert and oriented to person, place, and time.  Psychiatric:        Mood and Affect: Mood normal.        Behavior: Behavior normal.         Thought Content: Thought content normal.        Judgment: Judgment normal.     Results for orders placed or performed in visit on 10/30/23  Bayer DCA Hb A1c Waived   Collection Time: 10/30/23  3:45 PM  Result Value Ref Range   HB A1C (BAYER DCA - WAIVED) 8.2 (H) 4.8 - 5.6 %  Microalbumin / creatinine urine ratio   Collection Time: 10/30/23  4:48 PM  Result Value Ref Range   Creatinine, Urine 120.8 Not Estab. mg/dL   Microalbumin, Urine 9.4 Not Estab. ug/mL   Microalb/Creat Ratio 8 0 - 29 mg/g creat       Pertinent labs & imaging results that were available during my care of the patient were reviewed by me and considered in my medical decision making.  Assessment & Plan:  Garrett was seen today for nasal congestion, fever, generalized body aches and cough.  Diagnoses and all orders for this visit:  URI with cough and congestion -     albuterol (VENTOLIN HFA) 108 (90 Base) MCG/ACT inhaler; Inhale 2 puffs into the lungs every 6 (six) hours as needed for wheezing or shortness of breath. -     doxycycline (VIBRA-TABS) 100 MG tablet; Take 1 tablet (100 mg total) by mouth 2 (two) times daily for 7 days. 1 po bid -     azithromycin (ZITHROMAX) 250 MG tablet; Take 2 tablets on day 1, then 1 tablet daily on days 2 through 5  Nasal congestion -     COVID-19, Flu A+B and RSV -     Veritor Flu A/B Waived -     albuterol (VENTOLIN HFA) 108 (90 Base) MCG/ACT inhaler; Inhale 2 puffs into the lungs every 6 (six) hours as needed for  wheezing or shortness of breath. -     doxycycline (VIBRA-TABS) 100 MG tablet; Take 1 tablet (100 mg total) by mouth 2 (two) times daily for 7 days. 1 po bid -     azithromycin (ZITHROMAX) 250 MG tablet; Take 2 tablets on day 1, then 1 tablet daily on days 2 through 5  Wheezing -     albuterol (VENTOLIN HFA) 108 (90 Base) MCG/ACT inhaler; Inhale 2 puffs into the lungs every 6 (six) hours as needed for wheezing or shortness of breath. -     doxycycline  (VIBRA-TABS) 100 MG tablet; Take 1 tablet (100 mg total) by mouth 2 (two) times daily for 7 days. 1 po bid -     azithromycin (ZITHROMAX) 250 MG tablet; Take 2 tablets on day 1, then 1 tablet daily on days 2 through 5  Scattered rhonchi of right lung -     albuterol (VENTOLIN HFA) 108 (90 Base) MCG/ACT inhaler; Inhale 2 puffs into the lungs every 6 (six) hours as needed for wheezing or shortness of breath. -     doxycycline (VIBRA-TABS) 100 MG tablet; Take 1 tablet (100 mg total) by mouth 2 (two) times daily for 7 days. 1 po bid -     azithromycin (ZITHROMAX) 250 MG tablet; Take 2 tablets on day 1, then 1 tablet daily on days 2 through 5  Uncontrolled type 2 diabetes mellitus with hyperglycemia, without long-term current use of insulin (HCC) Prednisone avoided due to uncontrolled DM    Assessment and Plan    Pneumonia Suspected pneumonia in the right upper lung, indicated by fever, sweating, fatigue, cough with dark sputum, and wheezing. Previous pneumonia during COVID-19 infection. Diabetes increases risk of complications, necessitating prompt treatment. Discontinued inappropriate self-medication with leftover levofloxacin due to concerns about antibiotic resistance and inappropriate use. Completing the full course of prescribed antibiotics is crucial to prevent resistance. - Prescribe doxycycline for 7 days and azithromycin for 5 days - Prescribe albuterol inhaler for use every 4-6 hours as needed for cough and wheezing - Continue Mucinex, acetaminophen, and ibuprofen as needed - Send prescriptions to the pharmacy - Review influenza report before he leaves - Advise return to work 24 hours after starting antibiotics - Provide work note for absence  Diabetes Mellitus Chronic condition managed with a sensor for blood glucose monitoring. Sensor recently dislodged, and blood glucose levels have ranged from 115 to 180 mg/dL. Prednisone was avoided due to its potential to elevate blood glucose  levels, opting instead for an albuterol inhaler to manage wheezing. - Provide a replacement Freestyle Libre sensor  Follow-up Necessary to monitor response to treatment and review results of COVID-19, influenza, and RSV tests available the following day. - Advise contact if symptoms worsen or new symptoms develop - Review COVID-19, influenza, and RSV test results when available          Continue all other maintenance medications.  Follow up plan: Return in 2 weeks (on 01/28/2024), or if symptoms worsen or fail to improve, for CAP follow up .   Continue healthy lifestyle choices, including diet (rich in fruits, vegetables, and lean proteins, and low in salt and simple carbohydrates) and exercise (at least 30 minutes of moderate physical activity daily).  Educational handout given for URI  The above assessment and management plan was discussed with the patient. The patient verbalized understanding of and has agreed to the management plan. Patient is aware to call the clinic if they develop any new symptoms or if symptoms  persist or worsen. Patient is aware when to return to the clinic for a follow-up visit. Patient educated on when it is appropriate to go to the emergency department.   Kattie Parrot, FNP-C [facility] [phone]

## 2024-01-15 LAB — COVID-19, FLU A+B AND RSV
Influenza A, NAA: NOT DETECTED
Influenza B, NAA: NOT DETECTED
RSV, NAA: NOT DETECTED
SARS-CoV-2, NAA: NOT DETECTED

## 2024-01-16 ENCOUNTER — Encounter: Payer: Self-pay | Admitting: Family Medicine

## 2024-01-28 ENCOUNTER — Ambulatory Visit: Admitting: Nutrition

## 2024-01-30 DIAGNOSIS — M25512 Pain in left shoulder: Secondary | ICD-10-CM | POA: Diagnosis not present

## 2024-01-30 DIAGNOSIS — M9901 Segmental and somatic dysfunction of cervical region: Secondary | ICD-10-CM | POA: Diagnosis not present

## 2024-01-31 ENCOUNTER — Ambulatory Visit: Payer: BC Managed Care – PPO | Admitting: Family Medicine

## 2024-01-31 ENCOUNTER — Encounter: Payer: Self-pay | Admitting: Family Medicine

## 2024-01-31 VITALS — BP 133/93 | HR 105 | Ht 77.0 in | Wt 314.0 lb

## 2024-01-31 DIAGNOSIS — E785 Hyperlipidemia, unspecified: Secondary | ICD-10-CM

## 2024-01-31 DIAGNOSIS — I152 Hypertension secondary to endocrine disorders: Secondary | ICD-10-CM

## 2024-01-31 DIAGNOSIS — E1169 Type 2 diabetes mellitus with other specified complication: Secondary | ICD-10-CM

## 2024-01-31 DIAGNOSIS — E781 Pure hyperglyceridemia: Secondary | ICD-10-CM | POA: Diagnosis not present

## 2024-01-31 DIAGNOSIS — M25512 Pain in left shoulder: Secondary | ICD-10-CM | POA: Diagnosis not present

## 2024-01-31 DIAGNOSIS — E1159 Type 2 diabetes mellitus with other circulatory complications: Secondary | ICD-10-CM | POA: Diagnosis not present

## 2024-01-31 DIAGNOSIS — I10 Essential (primary) hypertension: Secondary | ICD-10-CM

## 2024-01-31 DIAGNOSIS — Z7984 Long term (current) use of oral hypoglycemic drugs: Secondary | ICD-10-CM

## 2024-01-31 LAB — LIPID PANEL

## 2024-01-31 LAB — BAYER DCA HB A1C WAIVED: HB A1C (BAYER DCA - WAIVED): 5 % (ref 4.8–5.6)

## 2024-01-31 NOTE — Progress Notes (Signed)
 BP (!) 133/93   Pulse (!) 105   Ht 6\' 5"  (1.956 m)   Wt (!) 314 lb (142.4 kg)   SpO2 97%   BMI 37.23 kg/m    Subjective:   Patient ID: Thomas [name], male    DOB: 04/10/1981, 43 y.o.   MRN: [id number]  HPI: Thomas [name] is a 43 y.o. male presenting on 01/31/2024 for Medical Management of Chronic Issues, Diabetes, Hyperlipidemia, and Hypertension   HPI Type 2 diabetes mellitus Patient comes in today for recheck of his diabetes. Patient has been currently taking metformin  and Jardiance  and Rybelsus . Patient is currently on an ACE inhibitor/ARB. Patient has not seen an ophthalmologist this year. Patient denies any new issues with their feet. The symptom started onset as an adult hypertension and hyperlipidemia ARE RELATED TO DM   Hypertension Patient is currently on losartan , and their blood pressure today is 133/93. Patient denies any lightheadedness or dizziness. Patient denies headaches, blurred vision, chest pains, shortness of breath, or weakness. Denies any side effects from medication and is content with current medication.   Hyperlipidemia Patient is coming in for recheck of his hyperlipidemia. The patient is currently taking fenofibrate  and Crestor . They deny any issues with myalgias or history of liver damage from it. They deny any focal numbness or weakness or chest pain.   Patient hurt his left shoulder lifting weights a couple days ago and he has seen an orthopedic for already, they are doing an MRI soon  Relevant past medical, surgical, family and social history reviewed and updated as indicated. Interim medical history since our last visit reviewed. Allergies and medications reviewed and updated.  Review of Systems  Constitutional:  Negative for chills and fever.  Respiratory:  Negative for shortness of breath and wheezing.   Cardiovascular:  Negative for chest pain and leg swelling.  Musculoskeletal:  Positive for arthralgias. Negative for back pain and gait  problem.  Skin:  Negative for rash.  All other systems reviewed and are negative.   Per HPI unless specifically indicated above   Allergies as of 01/31/2024       Reactions   Penicillins Hives, Rash, Other (See Comments)   As a child   Amoxicillin Hives, Rash        Medication List        Accurate as of Jan 31, 2024  4:22 PM. If you have any questions, ask your nurse or doctor.          albuterol  108 (90 Base) MCG/ACT inhaler Commonly known as: VENTOLIN  HFA Inhale 2 puffs into the lungs every 6 (six) hours as needed for wheezing or shortness of breath.   empagliflozin  25 MG Tabs tablet Commonly known as: Jardiance  Take 1 tablet (25 mg total) by mouth daily before breakfast.   fenofibrate  145 MG tablet Commonly known as: TRICOR  Take 1 tablet (145 mg total) by mouth daily.   fluticasone  50 MCG/ACT nasal spray Commonly known as: FLONASE  Place 2 sprays into both nostrils daily.   FreeStyle Libre 3 Sensor Misc PLACE 1 SENSOR ONTO THE SKIN EVERY 14 DAYS TO CHECK GLUCOSE   losartan  50 MG tablet Commonly known as: COZAAR  Take 1 tablet (50 mg total) by mouth daily.   metFORMIN  1000 MG tablet Commonly known as: GLUCOPHAGE  Take 1 tablet (1,000 mg total) by mouth 2 (two) times daily with a meal.   multivitamin with minerals Tabs tablet Take 1 tablet by mouth daily.   omeprazole  20 MG capsule Commonly  known as: PRILOSEC Take 1 capsule (20 mg total) by mouth daily.   ondansetron  4 MG disintegrating tablet Commonly known as: Zofran  ODT Take 1 tablet (4 mg total) by mouth every 8 (eight) hours as needed for nausea or vomiting.   promethazine  25 MG suppository Commonly known as: PHENERGAN  Place 1 suppository (25 mg total) rectally every 6 (six) hours as needed for nausea or vomiting.   rosuvastatin  20 MG tablet Commonly known as: Crestor  Take 1 tablet (20 mg total) by mouth daily.   rosuvastatin  10 MG tablet Commonly known as: CRESTOR  TAKE 1 TABLET BY MOUTH  EVERY DAY   Rybelsus  7 MG Tabs Generic drug: Semaglutide  Take 1 tablet (7 mg total) by mouth daily.   sucralfate  1 g tablet Commonly known as: CARAFATE  TAKE 1 TABLET (1 G TOTAL) BY MOUTH 4 TIMES A DAY WITH MEALS AND AT BEDTIME         Objective:   BP (!) 133/93   Pulse (!) 105   Ht 6\' 5"  (1.956 m)   Wt (!) 314 lb (142.4 kg)   SpO2 97%   BMI 37.23 kg/m   Wt Readings from Last 3 Encounters:  01/31/24 (!) 314 lb (142.4 kg)  01/14/24 (!) 317 lb 3.2 oz (143.9 kg)  12/12/23 (!) 320 lb (145.2 kg)    Physical Exam Vitals and nursing note reviewed.  Constitutional:      General: He is not in acute distress.    Appearance: He is well-developed. He is not diaphoretic.  Eyes:     General: No scleral icterus.    Conjunctiva/sclera: Conjunctivae normal.  Neck:     Thyroid: No thyromegaly.  Cardiovascular:     Rate and Rhythm: Normal rate and regular rhythm.     Heart sounds: Normal heart sounds. No murmur heard. Pulmonary:     Effort: Pulmonary effort is normal. No respiratory distress.     Breath sounds: Normal breath sounds. No wheezing.  Skin:    General: Skin is warm and dry.     Findings: No rash.  Neurological:     Mental Status: He is alert and oriented to person, place, and time.     Coordination: Coordination normal.  Psychiatric:        Behavior: Behavior normal.       Assessment & Plan:   Problem List Items Addressed This Visit       Cardiovascular and Mediastinum   Hypertension associated with diabetes (HCC)     Endocrine   Type 2 diabetes mellitus with other specified complication (HCC) - Primary   Relevant Orders   Bayer DCA Hb A1c Waived   CBC with Differential/Platelet   Lipid panel   CMP14+EGFR   Hyperlipidemia associated with type 2 diabetes mellitus (HCC)   Other Visit Diagnoses       Hypertriglyceridemia       Relevant Orders   Bayer DCA Hb A1c Waived   CBC with Differential/Platelet   Lipid panel   CMP14+EGFR     Essential  hypertension       Relevant Orders   Bayer DCA Hb A1c Waived   CBC with Differential/Platelet   Lipid panel   CMP14+EGFR       A1c 7.1, much improved, last time it was 8.3.  Blood pressure slightly elevated on the diastolic, he will keep close eye on it.  He is in some pain today from his shoulder injury and said earlier today he was at an orthopedic office and it  was 122/80. Follow up plan: Return in about 3 months (around 05/02/2024), or if symptoms worsen or fail to improve, for Diabetes hypertension and cholesterol.  Counseling provided for all of the vaccine components Orders Placed This Encounter  Procedures   Bayer DCA Hb A1c Waived   CBC with Differential/Platelet   Lipid panel   CMP14+EGFR    Jolyne Needs, MD [facility] 01/31/2024, 4:22 PM

## 2024-02-01 LAB — CBC WITH DIFFERENTIAL/PLATELET
Basophils Absolute: 0 10*3/uL (ref 0.0–0.2)
Basos: 0 %
EOS (ABSOLUTE): 0.1 10*3/uL (ref 0.0–0.4)
Eos: 2 %
Hematocrit: 44.9 % (ref 37.5–51.0)
Hemoglobin: 14.7 g/dL (ref 13.0–17.7)
Immature Grans (Abs): 0 10*3/uL (ref 0.0–0.1)
Immature Granulocytes: 0 %
Lymphocytes Absolute: 2 10*3/uL (ref 0.7–3.1)
Lymphs: 23 %
MCH: 26.3 pg — ABNORMAL LOW (ref 26.6–33.0)
MCHC: 32.7 g/dL (ref 31.5–35.7)
MCV: 81 fL (ref 79–97)
Monocytes Absolute: 0.6 10*3/uL (ref 0.1–0.9)
Monocytes: 8 %
Neutrophils Absolute: 5.6 10*3/uL (ref 1.4–7.0)
Neutrophils: 67 %
Platelets: 308 10*3/uL (ref 150–450)
RBC: 5.58 x10E6/uL (ref 4.14–5.80)
RDW: 14 % (ref 11.6–15.4)
WBC: 8.4 10*3/uL (ref 3.4–10.8)

## 2024-02-01 LAB — LIPID PANEL
Cholesterol, Total: 155 mg/dL (ref 100–199)
HDL: 35 mg/dL — ABNORMAL LOW (ref >39)
LDL CALC COMMENT:: 4.4 ratio (ref 0.0–5.0)
LDL Chol Calc (NIH): 94 mg/dL (ref 0–99)
Triglycerides: 145 mg/dL (ref 0–149)
VLDL Cholesterol Cal: 26 mg/dL (ref 5–40)

## 2024-02-01 LAB — CMP14+EGFR
ALT: 19 IU/L (ref 0–44)
AST: 16 IU/L (ref 0–40)
Albumin: 4.6 g/dL (ref 4.1–5.1)
Alkaline Phosphatase: 65 IU/L (ref 44–121)
BUN/Creatinine Ratio: 19 (ref 9–20)
BUN: 22 mg/dL (ref 6–24)
Bilirubin Total: 0.4 mg/dL (ref 0.0–1.2)
CO2: 21 mmol/L (ref 20–29)
Calcium: 9.9 mg/dL (ref 8.7–10.2)
Chloride: 102 mmol/L (ref 96–106)
Creatinine, Ser: 1.13 mg/dL (ref 0.76–1.27)
Globulin, Total: 2.3 g/dL (ref 1.5–4.5)
Glucose: 122 mg/dL — ABNORMAL HIGH (ref 70–99)
Potassium: 4.6 mmol/L (ref 3.5–5.2)
Sodium: 140 mmol/L (ref 134–144)
Total Protein: 6.9 g/dL (ref 6.0–8.5)
eGFR: 84 mL/min/{1.73_m2} (ref >59)

## 2024-02-06 ENCOUNTER — Encounter: Payer: Self-pay | Admitting: Family Medicine

## 2024-02-06 DIAGNOSIS — M25512 Pain in left shoulder: Secondary | ICD-10-CM | POA: Diagnosis not present

## 2024-02-10 DIAGNOSIS — M25512 Pain in left shoulder: Secondary | ICD-10-CM | POA: Diagnosis not present

## 2024-02-20 DIAGNOSIS — M25512 Pain in left shoulder: Secondary | ICD-10-CM | POA: Diagnosis not present

## 2024-02-20 DIAGNOSIS — M25612 Stiffness of left shoulder, not elsewhere classified: Secondary | ICD-10-CM | POA: Diagnosis not present

## 2024-02-20 DIAGNOSIS — M6281 Muscle weakness (generalized): Secondary | ICD-10-CM | POA: Diagnosis not present

## 2024-02-26 DIAGNOSIS — M6281 Muscle weakness (generalized): Secondary | ICD-10-CM | POA: Diagnosis not present

## 2024-02-26 DIAGNOSIS — M25512 Pain in left shoulder: Secondary | ICD-10-CM | POA: Diagnosis not present

## 2024-02-26 DIAGNOSIS — M25612 Stiffness of left shoulder, not elsewhere classified: Secondary | ICD-10-CM | POA: Diagnosis not present

## 2024-03-04 ENCOUNTER — Encounter: Payer: Self-pay | Admitting: Family Medicine

## 2024-03-04 ENCOUNTER — Ambulatory Visit (HOSPITAL_COMMUNITY)
Admission: RE | Admit: 2024-03-04 | Discharge: 2024-03-04 | Disposition: A | Discharge status: Home / Self Care | Source: Ambulatory Visit | Attending: Family Medicine | Admitting: Family Medicine

## 2024-03-04 ENCOUNTER — Ambulatory Visit: Admitting: Family Medicine

## 2024-03-04 ENCOUNTER — Other Ambulatory Visit: Payer: Self-pay | Admitting: Family Medicine

## 2024-03-04 ENCOUNTER — Ambulatory Visit: Payer: Self-pay | Admitting: Family Medicine

## 2024-03-04 VITALS — BP 114/74 | HR 102 | Temp 98.7°F | Ht 77.0 in | Wt 321.0 lb

## 2024-03-04 DIAGNOSIS — R221 Localized swelling, mass and lump, neck: Secondary | ICD-10-CM

## 2024-03-04 DIAGNOSIS — E042 Nontoxic multinodular goiter: Secondary | ICD-10-CM | POA: Diagnosis not present

## 2024-03-04 NOTE — Progress Notes (Signed)
 Subjective:  Patient ID: Thomas [name], male    DOB: 13-Jul-1981, 43 y.o.   MRN: [id number]  Patient Care Team: Dettinger, Lucio Sabin, MD as PCP - General (Family Medicine)   Chief Complaint:  lump on throat  HPI: Thomas [name] is a 43 y.o. male presenting on 03/04/2024 for lump on throat  HPI Presents today with wife for a lump on his throat that he noticed yesterday. Denies pain, itching, trouble swallowing. Denies history of thyroid dysfunction for himself or family that he knows of. Recently had pneumonia in April. Quit smoking in January of 2025. Only smoked socially. No history of chewing tobacco. He denies any symptoms such as fatigue, fevers, night sweats.   Relevant past medical, surgical, family, and social history reviewed and updated as indicated.  Allergies and medications reviewed and updated. Data reviewed: Chart in Epic.   Past Medical History:  Diagnosis Date   Plantar fasciitis     No past surgical history on file.  Social History   Socioeconomic History   Marital status: Married    Spouse name: Not on file   Number of children: Not on file   Years of education: Not on file   Highest education level: Not on file  Occupational History   Not on file  Tobacco Use   Smoking status: Every Day    Current packs/day: 0.25    Types: Cigarettes   Smokeless tobacco: Never  Vaping Use   Vaping status: Never Used  Substance and Sexual Activity   Alcohol use: Yes    Comment: occasional   Drug use: No   Sexual activity: Not on file  Other Topics Concern   Not on file  Social History Narrative   Not on file   Social Drivers of Health   Financial Resource Strain: Not on file  Food Insecurity: Not on file  Transportation Needs: Not on file  Physical Activity: Not on file  Stress: Not on file  Social Connections: Unknown (02/09/2022)   Received from [facility], [facility]   Social Network    Social Network: Not on file  Intimate Partner Violence:  Unknown (01/03/2022)   Received from [facility], [facility]   HITS    Physically Hurt: Not on file    Insult or Talk Down To: Not on file    Threaten Physical Harm: Not on file    Scream or Curse: Not on file    Outpatient Encounter Medications as of 03/04/2024  Medication Sig   albuterol  (VENTOLIN  HFA) 108 (90 Base) MCG/ACT inhaler Inhale 2 puffs into the lungs every 6 (six) hours as needed for wheezing or shortness of breath.   Continuous Glucose Sensor (FREESTYLE LIBRE 3 SENSOR) MISC PLACE 1 SENSOR ONTO THE SKIN EVERY 14 DAYS TO CHECK GLUCOSE   empagliflozin  (JARDIANCE ) 25 MG TABS tablet Take 1 tablet (25 mg total) by mouth daily before breakfast.   fenofibrate  (TRICOR ) 145 MG tablet Take 1 tablet (145 mg total) by mouth daily.   fluticasone  (FLONASE ) 50 MCG/ACT nasal spray Place 2 sprays into both nostrils daily.   losartan  (COZAAR ) 50 MG tablet Take 1 tablet (50 mg total) by mouth daily.   metFORMIN  (GLUCOPHAGE ) 1000 MG tablet Take 1 tablet (1,000 mg total) by mouth 2 (two) times daily with a meal.   Multiple Vitamin (MULTIVITAMIN WITH MINERALS) TABS tablet Take 1 tablet by mouth daily.   omeprazole  (PRILOSEC) 20 MG capsule Take 1 capsule (20 mg total) by  mouth daily.   ondansetron  (ZOFRAN  ODT) 4 MG disintegrating tablet Take 1 tablet (4 mg total) by mouth every 8 (eight) hours as needed for nausea or vomiting.   promethazine  (PHENERGAN ) 25 MG suppository Place 1 suppository (25 mg total) rectally every 6 (six) hours as needed for nausea or vomiting.   rosuvastatin  (CRESTOR ) 10 MG tablet TAKE 1 TABLET BY MOUTH EVERY DAY   rosuvastatin  (CRESTOR ) 20 MG tablet Take 1 tablet (20 mg total) by mouth daily.   Semaglutide  (RYBELSUS ) 7 MG TABS Take 1 tablet (7 mg total) by mouth daily.   sucralfate  (CARAFATE ) 1 g tablet TAKE 1 TABLET (1 G TOTAL) BY MOUTH 4 TIMES A DAY WITH MEALS AND AT BEDTIME   No facility-administered encounter medications on file as of 03/04/2024.    Allergies  Allergen  Reactions   Penicillins Hives, Rash and Other (See Comments)    As a child   Amoxicillin Hives and Rash    Review of Systems As per HPI  Objective:  BP 114/74   Pulse (!) 102   Temp 98.7 F (37.1 C)   Ht 6\' 5"  (1.956 m)   Wt (!) 321 lb (145.6 kg)   SpO2 98%   BMI 38.07 kg/m    Wt Readings from Last 3 Encounters:  03/04/24 (!) 321 lb (145.6 kg)  01/31/24 (!) 314 lb (142.4 kg)  01/14/24 (!) 317 lb 3.2 oz (143.9 kg)    Physical Exam Constitutional:      General: He is awake. He is not in acute distress.    Appearance: Normal appearance. He is well-developed and well-groomed. He is obese. He is not ill-appearing, toxic-appearing or diaphoretic.  Neck:      Comments: Nontender mass on right, anterior, neck ~5cm x ~4.5cm  Cardiovascular:     Rate and Rhythm: Regular rhythm. Tachycardia present.     Pulses: Normal pulses.     Heart sounds: Normal heart sounds. No murmur heard.    No gallop.  Pulmonary:     Effort: Pulmonary effort is normal. No respiratory distress.     Breath sounds: Normal breath sounds. No stridor. No wheezing, rhonchi or rales.  Musculoskeletal:     Cervical back: Full passive range of motion without pain and neck supple.  Skin:    General: Skin is warm.     Capillary Refill: Capillary refill takes less than 2 seconds.  Neurological:     General: No focal deficit present.     Mental Status: He is alert, oriented to person, place, and time and easily aroused. Mental status is at baseline.     GCS: GCS eye subscore is 4. GCS verbal subscore is 5. GCS motor subscore is 6.     Motor: No weakness.  Psychiatric:        Attention and Perception: Attention and perception normal.        Mood and Affect: Mood and affect normal.        Speech: Speech normal.        Behavior: Behavior normal. Behavior is cooperative.        Thought Content: Thought content normal. Thought content does not include homicidal or suicidal ideation. Thought content does not include  homicidal or suicidal plan.        Cognition and Memory: Cognition and memory normal.        Judgment: Judgment normal.     Results for orders placed or performed in visit on 02/27/24  HM DIABETES EYE EXAM  Collection Time: 12/04/23  9:38 AM  Result Value Ref Range   HM Diabetic Eye Exam No Retinopathy No Retinopathy       03/04/2024   12:10 PM 01/31/2024    3:28 PM 01/14/2024   11:10 AM 12/12/2023    3:46 PM 10/30/2023    3:54 PM  Depression screen PHQ 2/9  Decreased Interest 0 0 0 0 0  Down, Depressed, Hopeless 0 0 0 0 0  PHQ - 2 Score 0 0 0 0 0  Altered sleeping   0    Tired, decreased energy   0    Change in appetite   0    Feeling bad or failure about yourself    0    Trouble concentrating   0    Moving slowly or fidgety/restless   0    Suicidal thoughts   0    PHQ-9 Score   0    Difficult doing work/chores   Not difficult at all         03/04/2024   12:10 PM 01/14/2024   11:10 AM 06/06/2023    3:54 PM 03/01/2023    3:40 PM  GAD 7 : Generalized Anxiety Score  Nervous, Anxious, on Edge 0 0 0 0  Control/stop worrying 0 0 0 0  Worry too much - different things 0 0 0 0  Trouble relaxing 0 0 0 0  Restless 0 0 0 0  Easily annoyed or irritable 0 0 0 0  Afraid - awful might happen 0 0 0 0  Total GAD 7 Score 0 0 0 0  Anxiety Difficulty Not difficult at all Not difficult at all Not difficult at all Not difficult at all    The 10-year ASCVD risk score (Arnett DK, et al., 2019) is: 2.3%   Values used to calculate the score:     Age: 19 years     Sex: Male     Is Non-Hispanic African American: No     Diabetic: Yes     Tobacco smoker: No     Systolic Blood Pressure: 114 mmHg     Is BP treated: Yes     HDL Cholesterol: 35 mg/dL     Total Cholesterol: 155 mg/dL  Pertinent labs & imaging results that were available during my care of the patient were reviewed by me and considered in my medical decision making.  Assessment & Plan:  Thomas [name] was seen today for lump on  throat.  Diagnoses and all orders for this visit:  Neck mass Imaging and labs as below. Will communicate results to patient once available. Will await results to determine next steps.  Recently completed labs with PCP, reviewed CBC, CMP, Lipid Profile from 01/31/2024 -     Cancel: US  THYROID; Future -     Thyroid Panel With TSH -     US  Soft Tissue Head/Neck (NON-THYROID); Future  Continue all other maintenance medications.  Follow up plan: Return if symptoms worsen or fail to improve.   Continue healthy lifestyle choices, including diet (rich in fruits, vegetables, and lean proteins, and low in salt and simple carbohydrates) and exercise (at least 30 minutes of moderate physical activity daily).  Written and verbal instructions provided   The above assessment and management plan was discussed with the patient. The patient verbalized understanding of and has agreed to the management plan. Patient is aware to call the clinic if they develop any new symptoms or if symptoms persist or worsen. Patient is  aware when to return to the clinic for a follow-up visit. Patient educated on when it is appropriate to go to the emergency department.   Jacqualyn Mates, DNP-FNP [facility] [address] [place], [place] [address] [phone]

## 2024-03-04 NOTE — Telephone Encounter (Signed)
 Called patient and discussed results

## 2024-03-04 NOTE — Progress Notes (Signed)
 Will order FNA stat for further evaluation based on US .

## 2024-03-05 ENCOUNTER — Ambulatory Visit: Payer: Self-pay | Admitting: Family Medicine

## 2024-03-05 LAB — THYROID PANEL WITH TSH
Free Thyroxine Index: 2.2 (ref 1.2–4.9)
T3 Uptake Ratio: 28 % (ref 24–39)
T4, Total: 7.7 ug/dL (ref 4.5–12.0)
TSH: 2.25 u[IU]/mL (ref 0.450–4.500)

## 2024-03-05 NOTE — Progress Notes (Signed)
 Normal thyroid labs. Will await FNA results.

## 2024-03-09 DIAGNOSIS — M25612 Stiffness of left shoulder, not elsewhere classified: Secondary | ICD-10-CM | POA: Diagnosis not present

## 2024-03-09 DIAGNOSIS — M25512 Pain in left shoulder: Secondary | ICD-10-CM | POA: Diagnosis not present

## 2024-03-09 DIAGNOSIS — M6281 Muscle weakness (generalized): Secondary | ICD-10-CM | POA: Diagnosis not present

## 2024-03-11 ENCOUNTER — Ambulatory Visit (HOSPITAL_COMMUNITY)
Admission: RE | Admit: 2024-03-11 | Discharge: 2024-03-11 | Disposition: A | Discharge status: Home / Self Care | Source: Ambulatory Visit | Attending: Family Medicine | Admitting: Family Medicine

## 2024-03-11 DIAGNOSIS — R221 Localized swelling, mass and lump, neck: Secondary | ICD-10-CM

## 2024-03-11 DIAGNOSIS — E041 Nontoxic single thyroid nodule: Secondary | ICD-10-CM | POA: Insufficient documentation

## 2024-03-11 DIAGNOSIS — E0789 Other specified disorders of thyroid: Secondary | ICD-10-CM | POA: Diagnosis not present

## 2024-03-11 MED ORDER — LIDOCAINE HCL (PF) 2 % IJ SOLN
INTRAMUSCULAR | Status: AC
  Filled 2024-03-11: qty 10

## 2024-03-11 MED ORDER — LIDOCAINE HCL (PF) 2 % IJ SOLN
10.0000 mL | Freq: Once | INTRAMUSCULAR | Status: AC
  Administered 2024-03-11: 10 mL

## 2024-03-11 NOTE — Progress Notes (Signed)
 PT tolerated thyroid  biopsy procedure well today. Labs and afirma obtained and sent for pathology by Richard O from ultrasound. PT ambulatory at discharge with no acute distress noted and verbalized understanding of discharge instructions.

## 2024-03-12 LAB — CYTOLOGY - NON PAP

## 2024-03-13 ENCOUNTER — Ambulatory Visit: Payer: Self-pay | Admitting: Family Medicine

## 2024-03-23 DIAGNOSIS — M75112 Incomplete rotator cuff tear or rupture of left shoulder, not specified as traumatic: Secondary | ICD-10-CM | POA: Diagnosis not present

## 2024-03-23 DIAGNOSIS — M7522 Bicipital tendinitis, left shoulder: Secondary | ICD-10-CM | POA: Diagnosis not present

## 2024-03-23 DIAGNOSIS — R221 Localized swelling, mass and lump, neck: Secondary | ICD-10-CM | POA: Diagnosis not present

## 2024-03-24 ENCOUNTER — Encounter (HOSPITAL_COMMUNITY): Payer: Self-pay

## 2024-03-24 ENCOUNTER — Ambulatory Visit: Payer: Self-pay | Admitting: Family Medicine

## 2024-03-24 ENCOUNTER — Telehealth: Payer: Self-pay | Admitting: Family Medicine

## 2024-03-24 DIAGNOSIS — E079 Disorder of thyroid, unspecified: Secondary | ICD-10-CM

## 2024-03-24 NOTE — Progress Notes (Signed)
See patient calls

## 2024-03-24 NOTE — Telephone Encounter (Signed)
Refer to lab results.  

## 2024-03-24 NOTE — Telephone Encounter (Signed)
 Copied from CRM [id number]. Topic: Clinical - Lab/Test Results >> Mar 24, 2024  2:41 PM Avram MATSU wrote: Reason for CRM: patient is requesting a callback to talk more about the test results.

## 2024-03-31 ENCOUNTER — Ambulatory Visit (INDEPENDENT_AMBULATORY_CARE_PROVIDER_SITE_OTHER): Admitting: Otolaryngology

## 2024-03-31 ENCOUNTER — Encounter (INDEPENDENT_AMBULATORY_CARE_PROVIDER_SITE_OTHER): Payer: Self-pay | Admitting: Otolaryngology

## 2024-03-31 VITALS — BP 119/78 | HR 96 | Ht 77.0 in | Wt 310.0 lb

## 2024-03-31 DIAGNOSIS — E041 Nontoxic single thyroid nodule: Secondary | ICD-10-CM | POA: Diagnosis not present

## 2024-03-31 HISTORY — DX: Nontoxic single thyroid nodule: E04.1

## 2024-03-31 NOTE — Progress Notes (Signed)
 Dear Dr. Gladis, Here is my assessment for our mutual patient, Thomas [name]. Thank you for allowing me the opportunity to care for your patient. Please do not hesitate to contact me should you have any other questions. Sincerely, Dr. Eldora Blanch  Otolaryngology Clinic Note Referring provider: Dr. Gladis HPI:  Thomas [name] is a 43 y.o. male kindly referred by Dr. Gladis for evaluation of thyroid  goiter  Initial visit (03/2024): Goiter: noted incidentally about 4 weeks ago. Enlarging: does not think so Patient reports: no pain in neck, no ear pain.  Compressive symptoms: mild compression (vague). No dysphagia or voice changes Hypo or hyperthyroid symptoms: denies Tobacco: no  Prior evaluation has included: labs, US , Biopsy  History of radiation to H&N: no Family history of thyroid  cancer: no  ENT Surgery: no Personal or FHx of bleeding dz or anesthesia difficulty: no  GLP-1: no AP/AC: no  PMHx: T2DM, HLD, HTN  Independent Review of Additional Tests or Records:  Thomas [name] (03/04/2024): noted incidental lump on throat/neck mass. Quit smoking Jan 2025. No other sx; DxNeck mass, thyroid  mass(?): Rx: US  thyroid , TSH TSH 03/04/2024, T4: wnl; CMP 01/31/2024: BUN/Cr 22/1.13 US  Thyroid  03/04/2024: 4.8 cm right mid-thyroid  nodule, noted <1 cm left thyroid  node; no visualized suspicious cervical LAD noted; well demarcated thyroid  node Cytology 03/11/2024: FLUS (bethesda III) Afirma testing 03/2024:   PMH/Meds/All/SocHx/FamHx/ROS:   Past Medical History:  Diagnosis Date   Plantar fasciitis      History reviewed. No pertinent surgical history.  Family History  Problem Relation Age of Onset   Diabetes Father    Kidney Stones Father    Hypertension Father    Heart disease Maternal Grandmother    COPD Maternal Grandmother    Heart disease Maternal Grandfather    Hypertension Maternal Grandfather    Diabetes Paternal Grandmother    Diabetes Paternal Grandfather    Heart disease  Paternal Grandfather      Social Connections: Unknown (02/09/2022)   Received from [employer]   Social Network    Social Network: Not on file      Current Outpatient Medications:    albuterol  (VENTOLIN  HFA) 108 (90 Base) MCG/ACT inhaler, Inhale 2 puffs into the lungs every 6 (six) hours as needed for wheezing or shortness of breath., Disp: 8 g, Rfl: 2   Continuous Glucose Sensor (FREESTYLE LIBRE 3 SENSOR) MISC, PLACE 1 SENSOR ONTO THE SKIN EVERY 14 DAYS TO CHECK GLUCOSE, Disp: 6 each, Rfl: 3   empagliflozin  (JARDIANCE ) 25 MG TABS tablet, Take 1 tablet (25 mg total) by mouth daily before breakfast., Disp: 90 tablet, Rfl: 3   fenofibrate  (TRICOR ) 145 MG tablet, Take 1 tablet (145 mg total) by mouth daily., Disp: 90 tablet, Rfl: 3   fluticasone  (FLONASE ) 50 MCG/ACT nasal spray, Place 2 sprays into both nostrils daily., Disp: 16 g, Rfl: 0   losartan  (COZAAR ) 50 MG tablet, Take 1 tablet (50 mg total) by mouth daily., Disp: 90 tablet, Rfl: 3   metFORMIN  (GLUCOPHAGE ) 1000 MG tablet, Take 1 tablet (1,000 mg total) by mouth 2 (two) times daily with a meal., Disp: 180 tablet, Rfl: 3   Multiple Vitamin (MULTIVITAMIN WITH MINERALS) TABS tablet, Take 1 tablet by mouth daily., Disp: , Rfl:    omeprazole  (PRILOSEC) 20 MG capsule, Take 1 capsule (20 mg total) by mouth daily., Disp: 90 capsule, Rfl: 3   ondansetron  (ZOFRAN  ODT) 4 MG disintegrating tablet, Take 1 tablet (4 mg total) by mouth every 8 (eight) hours as needed for nausea  or vomiting., Disp: 30 tablet, Rfl: 1   promethazine  (PHENERGAN ) 25 MG suppository, Place 1 suppository (25 mg total) rectally every 6 (six) hours as needed for nausea or vomiting., Disp: 12 each, Rfl: 0   rosuvastatin  (CRESTOR ) 10 MG tablet, TAKE 1 TABLET BY MOUTH EVERY DAY, Disp: 90 tablet, Rfl: 3   rosuvastatin  (CRESTOR ) 20 MG tablet, Take 1 tablet (20 mg total) by mouth daily., Disp: 90 tablet, Rfl: 3   Semaglutide  (RYBELSUS ) 7 MG TABS, Take 1 tablet (7 mg total) by mouth  daily., Disp: 30 tablet, Rfl: 3   sucralfate  (CARAFATE ) 1 g tablet, TAKE 1 TABLET (1 G TOTAL) BY MOUTH 4 TIMES A DAY WITH MEALS AND AT BEDTIME, Disp: 90 tablet, Rfl: 0   Physical Exam:   BP 119/78 (BP Location: Right Arm, Patient Position: Sitting, Cuff Size: Large)   Pulse 96   Ht 6' 5 (1.956 m)   Wt (!) 310 lb (140.6 kg)   SpO2 93%   BMI 36.76 kg/m   Salient findings:  CN II-XII intact Bilateral EAC clear and TM intact with well pneumatized middle ear spaces Anterior rhinoscopy: Septum intact; bilateral inferior turbinates without significant hypertrophy No lesions of oral cavity/oropharynx No obviously palpable neck masses/lymphadenopathy - right relatively soft, 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  Seprately Identifiable Procedures:  Prior to initiating any procedures, risks/benefits/alternatives were explained to the patient and verbal consent obtained. Procedure Note Pre-procedure diagnosis:  Thyroid  nodule, assess vocal folds Post-procedure diagnosis: Same Procedure: Transnasal Fiberoptic Laryngoscopy, CPT 31575 - Mod 25 Indication: see above Complications: None apparent EBL: 0 mL  The procedure was undertaken to further evaluate the patient's complaint above, with mirror exam inadequate for appropriate examination due to gag reflex and poor patient tolerance  Procedure:  Patient was identified as correct patient. Verbal consent was obtained. The nose was sprayed with oxymetazoline and 4% lidocaine . The The flexible laryngoscope was passed through the nose to view the nasal cavity, pharynx (oropharynx, hypopharynx) and larynx.  The larynx was examined at rest and during multiple phonatory tasks. Documentation was obtained and reviewed with patient. The scope was removed. The patient  tolerated the procedure well.  Findings: The nasal cavity and nasopharynx did not reveal any masses or lesions, mucosa appeared to be without obvious lesions. The tongue base, pharyngeal walls, piriform sinuses, vallecula, epiglottis and postcricoid region are normal in appearance. The visualized portion of the subglottis and proximal trachea is widely patent. The vocal folds are mobile bilaterally. There are no lesions on the free edge of the vocal folds nor elsewhere in the larynx worrisome for malignancy.      Electronically signed by: Eldora KATHEE Blanch, MD 03/31/2024 2:52 PM   Impression & Plans:  Thomas [name] is a 44 y.o. male with:  1. Thyroid  nodule    Bethesda III, but afrima testing shows 75% risk of malignancy. Right sided nodule, left with small nodule but does not look worrisome. We discussed options: observation, lobectomy v/s total thyroidectomy After an extensive discussion, patient opted for right lobectomy with understanding he may need completion thyroidectomy Risks of thyroidectomy were discussed including pain, bleeding, infection, injury to recurrent laryngeal nerve including vocal fold paralysis and hoarseness, lack of improvement, need for further procedures, change in swallowing, hypocalcemia (not with lobectomy), injury to trachea or surrounding great vessels including  pulmonary complications, and discovery of malignancy. Patient understands these risks, and despite these risks, wishes to proceed.   - Will schedule right lobectomy, f/u POD 7; will need overnight stay  See below regarding exact medications prescribed this encounter including dosages and route: No orders of the defined types were placed in this encounter.     Thank you for allowing me the opportunity to care for your patient. Please do not hesitate to contact me should you have any other questions.  Sincerely, Eldora Blanch, MD Otolaryngologist (ENT), [facility] Phone:  [phone] Fax: [phone]  03/31/2024, 2:52 PM   MDM:  Level 4 - [phone] Complexity/Problems addressed: new problem Data complexity: high - independent interpretation of imaging; review of notes, labs - Morbidity: mod - decision for surgery - Prescription Drug prescribed or managed: no

## 2024-04-01 ENCOUNTER — Telehealth (INDEPENDENT_AMBULATORY_CARE_PROVIDER_SITE_OTHER): Payer: Self-pay

## 2024-04-01 NOTE — Telephone Encounter (Signed)
 Wife left a message that she has some additional questions.

## 2024-04-21 ENCOUNTER — Encounter (HOSPITAL_COMMUNITY): Payer: Self-pay | Admitting: *Deleted

## 2024-04-21 ENCOUNTER — Other Ambulatory Visit: Payer: Self-pay

## 2024-04-21 NOTE — Progress Notes (Addendum)
 PCP - Dr Fonda Dettinger Cardiologist - none  Chest x-ray - n/a EKG - DOS Stress Test - n/a ECHO - n/a Cardiac Cath - n/a  ICD Pacemaker/Loop - n/a  Sleep Study -  n/a  Diabetes Type 2 FreeStyle Libre 3, Sensor on right arm Fasting Blood Sugar - 120-160s Checks Blood Sugar several times a day  Hold Jardiance  72 hours prior to procedure.  Last dose was on  04/21/24 (SDW call).  Do not take Metformin  on the morning of surgery.  Aspirin & Blood Thinner Instructions:  n/a  ERAS - clear liquids til 9:30 AM DOS.  Anesthesia review: Yes  STOP now taking any Aspirin (unless otherwise instructed by your surgeon), Aleve, Naproxen, Ibuprofen , Motrin , Advil , Goody's, BC's, all herbal medications, fish oil, and all vitamins.   Coronavirus Screening Do you have any of the following symptoms:  Cough yes/no: No Fever (>100.42F)  yes/no: No Runny nose yes/no: No Sore throat yes/no: No Difficulty breathing/shortness of breath  yes/no: No  Have you traveled in the last 14 days and where? Yes- [place] (04/06/24-04/11/24)  Patient verbalized understanding of instructions that were given via phone.

## 2024-04-21 NOTE — Progress Notes (Signed)
 Anesthesia Chart Review: Thomas [name]  Case: [id number] Date/Time: 04/22/24 1215   Procedure: LOBECTOMY, THYROID  (Right)   Anesthesia type: General   Diagnosis: Thyroid  nodule [E04.1]   Pre-op diagnosis: Thyroid  nodule   [facility]: [facility] ROOM 11 / [facility]   Surgeons: Tobie Eldora NOVAK, MD       DISCUSSION: Patient is a 43 year old male scheduled for the above procedure.  History includes former smoker (quit 10/02/23), dyspnea, HTN, HLD, DM2, GERD, right thyroid  nodules, nephrolithiasis.  03/11/24 right thyroid  nodule FNA showed follicular lesion of undetermined significance (Bethesda category III). Afirma Genomic Sequencing was Afirma GSC Suspicous and PAX8/PPARG positive which suggests a risk of cancer of ~ 75%... He had a normal thyroid  panel on 03/04/24 including: Lab Results  Component Value Date   TSH 2.250 03/04/2024   T4TOTAL 7.7 03/04/2024    A1c 5.0% on 01/31/24. Rybelsus  is listed as not currently taking. He is on metformin  and Jardiance . He had not been given instructions to hold Jardiance , so last dose 04/21/24.  He has a [employer] 3 CGM.  Anesthesia team to evaluate on the day of surgery.    VS: Ht 6' 5 (1.956 m)   Wt (!) 140.6 kg   BMI 36.76 kg/m   BP Readings from Last 3 Encounters:  03/31/24 119/78  03/11/24 121/76  03/04/24 114/74   Pulse Readings from Last 3 Encounters:  03/31/24 96  03/11/24 72  03/04/24 (!) 102    PROVIDERS: Dettinger, Fonda LABOR, MD is PCP    LABS: For day of surgery as indicated. Most recent results in [facility] include: Lab Results  Component Value Date   WBC 8.4 01/31/2024   HGB 14.7 01/31/2024   HCT 44.9 01/31/2024   PLT 308 01/31/2024   GLUCOSE 122 (H) 01/31/2024   CHOL 155 01/31/2024   TRIG 145 01/31/2024   HDL 35 (L) 01/31/2024   LDLCALC 94 01/31/2024   ALT 19 01/31/2024   AST 16 01/31/2024   NA 140 01/31/2024   K 4.6 01/31/2024   CL 102 01/31/2024   CREATININE 1.13 01/31/2024   BUN 22 01/31/2024   CO2 21 01/31/2024    TSH 2.250 03/04/2024   HGBA1C 5.0 01/31/2024    IMAGES: US  Thyroid  03/04/24: Parenchymal Echotexture: Mildly heterogenous Isthmus: 0.3 cm thickness Right lobe: 5.2 x 3.6 x 3.8 cm Left lobe: 5.6 x 1.7 x 1.5 cm IMPRESSION: 4.8 cm right mid thyroid  nodule, meeting criteria for FNA.   The above is in keeping with the ACR TI-RADS recommendations - J Am Coll Radiol 2017;14:587-595.  EKG: EKG 05/15/20: Sinus tachycardia at 132 bpm Possible Left atrial enlargement Borderline ECG No old tracing to compare Confirmed by Raford Lenis ([id number]) on 05/15/2020 11:08:44 PM  CV: N/A  Past Medical History:  Diagnosis Date   Diabetes mellitus without complication (HCC)    type 2   Dyspnea    albuterol  inhaler prn   GERD (gastroesophageal reflux disease)    omeprazole    History of kidney stones 03/2015   in [place] - passed stone   HLD (hyperlipidemia)    crestor    Hypertension    losartan    Plantar fasciitis    hx - wears insoles in shoes   Pneumonia    x several   Thyroid  nodule 03/2024   right    Past Surgical History:  Procedure Laterality Date   broken arm Left    as a child    MEDICATIONS: No current facility-administered medications for  this encounter.    albuterol  (VENTOLIN  HFA) 108 (90 Base) MCG/ACT inhaler   cholecalciferol (VITAMIN D3) 25 MCG (1000 UNIT) tablet   CLOMID 50 MG tablet   Continuous Glucose Sensor (FREESTYLE LIBRE 3 SENSOR) MISC   empagliflozin  (JARDIANCE ) 25 MG TABS tablet   fenofibrate  (TRICOR ) 145 MG tablet   losartan  (COZAAR ) 50 MG tablet   metFORMIN  (GLUCOPHAGE ) 1000 MG tablet   Multiple Vitamin (MULTIVITAMIN WITH MINERALS) TABS tablet   omeprazole  (PRILOSEC) 20 MG capsule   ondansetron  (ZOFRAN  ODT) 4 MG disintegrating tablet   rosuvastatin  (CRESTOR ) 10 MG tablet   Semaglutide  (RYBELSUS ) 7 MG TABS   sucralfate  (CARAFATE ) 1 g tablet    Isaiah Ruder, PA-C Surgical Short Stay/Anesthesiology [facility] Phone [phone] [facility] Phone [phone] 04/21/2024 11:37 AM

## 2024-04-21 NOTE — Anesthesia Preprocedure Evaluation (Signed)
 Anesthesia Evaluation  Patient identified by MRN, date of birth, ID band Patient awake    Reviewed: Allergy & Precautions, NPO status , Patient's Chart, lab work & pertinent test results  Airway Mallampati: III  TM Distance: >3 FB Neck ROM: Full    Dental  (+) Teeth Intact, Dental Advisory Given   Pulmonary former smoker   Pulmonary exam normal breath sounds clear to auscultation       Cardiovascular hypertension, Pt. on medications Normal cardiovascular exam Rhythm:Regular Rate:Normal     Neuro/Psych negative neurological ROS     GI/Hepatic Neg liver ROS,GERD  Medicated and Controlled,,  Endo/Other  diabetes, Type 2, Oral Hypoglycemic Agents  Obesity   Renal/GU negative Renal ROS     Musculoskeletal negative musculoskeletal ROS (+)    Abdominal   Peds  Hematology negative hematology ROS (+)   Anesthesia Other Findings Day of surgery medications reviewed with the patient.  Reproductive/Obstetrics                              Anesthesia Physical Anesthesia Plan  ASA: 2  Anesthesia Plan: General   Post-op Pain Management: Tylenol  PO (pre-op)*   Induction: Intravenous  PONV Risk Score and Plan: 3 and Scopolamine patch - Pre-op, Midazolam , Dexamethasone  and Ondansetron   Airway Management Planned: Oral ETT and Video Laryngoscope Planned  Additional Equipment:   Intra-op Plan:   Post-operative Plan: Extubation in OR  Informed Consent: I have reviewed the patients History and Physical, chart, labs and discussed the procedure including the risks, benefits and alternatives for the proposed anesthesia with the patient or authorized representative who has indicated his/her understanding and acceptance.     Dental advisory given  Plan Discussed with: CRNA  Anesthesia Plan Comments: (PAT note written 04/21/2024 by Chayla Shands, PA-C.  )         Anesthesia Quick  Evaluation

## 2024-04-22 ENCOUNTER — Ambulatory Visit (HOSPITAL_COMMUNITY): Payer: Self-pay | Admitting: Vascular Surgery

## 2024-04-22 ENCOUNTER — Ambulatory Visit (HOSPITAL_COMMUNITY)
Admission: RE | Admit: 2024-04-22 | Discharge: 2024-04-23 | Disposition: A | Discharge status: Home / Self Care | Source: Ambulatory Visit | Attending: Otolaryngology | Admitting: Otolaryngology

## 2024-04-22 ENCOUNTER — Other Ambulatory Visit: Payer: Self-pay

## 2024-04-22 ENCOUNTER — Encounter (HOSPITAL_COMMUNITY): Payer: Self-pay

## 2024-04-22 ENCOUNTER — Encounter (HOSPITAL_COMMUNITY)
Admission: RE | Disposition: A | Discharge status: Home / Self Care | Payer: Self-pay | Source: Ambulatory Visit | Attending: Otolaryngology

## 2024-04-22 DIAGNOSIS — E669 Obesity, unspecified: Secondary | ICD-10-CM | POA: Insufficient documentation

## 2024-04-22 DIAGNOSIS — Z7984 Long term (current) use of oral hypoglycemic drugs: Secondary | ICD-10-CM | POA: Insufficient documentation

## 2024-04-22 DIAGNOSIS — I10 Essential (primary) hypertension: Secondary | ICD-10-CM | POA: Diagnosis not present

## 2024-04-22 DIAGNOSIS — E119 Type 2 diabetes mellitus without complications: Secondary | ICD-10-CM | POA: Diagnosis not present

## 2024-04-22 DIAGNOSIS — Z6836 Body mass index (BMI) 36.0-36.9, adult: Secondary | ICD-10-CM | POA: Insufficient documentation

## 2024-04-22 DIAGNOSIS — Z87891 Personal history of nicotine dependence: Secondary | ICD-10-CM | POA: Insufficient documentation

## 2024-04-22 DIAGNOSIS — C73 Malignant neoplasm of thyroid gland: Secondary | ICD-10-CM | POA: Diagnosis not present

## 2024-04-22 DIAGNOSIS — K219 Gastro-esophageal reflux disease without esophagitis: Secondary | ICD-10-CM | POA: Insufficient documentation

## 2024-04-22 DIAGNOSIS — E041 Nontoxic single thyroid nodule: Secondary | ICD-10-CM | POA: Diagnosis not present

## 2024-04-22 HISTORY — DX: Dyspnea, unspecified: R06.00

## 2024-04-22 HISTORY — DX: Gastro-esophageal reflux disease without esophagitis: K21.9

## 2024-04-22 HISTORY — DX: Essential (primary) hypertension: I10

## 2024-04-22 HISTORY — PX: THYROID LOBECTOMY: SHX420

## 2024-04-22 HISTORY — DX: Type 2 diabetes mellitus without complications: E11.9

## 2024-04-22 HISTORY — DX: Hyperlipidemia, unspecified: E78.5

## 2024-04-22 HISTORY — DX: Pneumonia, unspecified organism: J18.9

## 2024-04-22 LAB — GLUCOSE, CAPILLARY
Glucose-Capillary: 130 mg/dL — ABNORMAL HIGH (ref 70–99)
Glucose-Capillary: 99 mg/dL (ref 70–99)
Glucose-Capillary: 181 mg/dL — ABNORMAL HIGH (ref 70–99)
Glucose-Capillary: 174 mg/dL — ABNORMAL HIGH (ref 70–99)

## 2024-04-22 SURGERY — LOBECTOMY, THYROID
Anesthesia: General | Laterality: Right

## 2024-04-22 MED ORDER — HEMOSTATIC AGENTS (NO CHARGE) OPTIME
TOPICAL | Status: DC | PRN
  Administered 2024-04-22: 2 via TOPICAL

## 2024-04-22 MED ORDER — ONDANSETRON HCL 4 MG/2ML IJ SOLN: 4.0000 mg | Freq: Four times a day (QID) | INTRAMUSCULAR | Status: DC | PRN

## 2024-04-22 MED ORDER — FENTANYL CITRATE (PF) 100 MCG/2ML IJ SOLN: 25.0000 ug | INTRAMUSCULAR | Status: DC | PRN

## 2024-04-22 MED ORDER — OXYCODONE HCL 5 MG PO TABS
5.0000 mg | ORAL_TABLET | ORAL | Status: DC | PRN
  Administered 2024-04-22 – 2024-04-23 (×2): 5 mg via ORAL
  Filled 2024-04-22 (×2): qty 1

## 2024-04-22 MED ORDER — DEXMEDETOMIDINE HCL IN NACL 80 MCG/20ML IV SOLN
INTRAVENOUS | Status: AC
  Filled 2024-04-22: qty 20

## 2024-04-22 MED ORDER — 0.9 % SODIUM CHLORIDE (POUR BTL) OPTIME
TOPICAL | Status: DC | PRN
  Administered 2024-04-22: 1000 mL

## 2024-04-22 MED ORDER — DEXAMETHASONE SODIUM PHOSPHATE 10 MG/ML IJ SOLN
INTRAMUSCULAR | Status: DC | PRN
  Administered 2024-04-22: 10 mg via INTRAVENOUS

## 2024-04-22 MED ORDER — EPHEDRINE 5 MG/ML INJ
INTRAVENOUS | Status: AC
  Filled 2024-04-22: qty 5

## 2024-04-22 MED ORDER — SUCCINYLCHOLINE CHLORIDE 200 MG/10ML IV SOSY
PREFILLED_SYRINGE | INTRAVENOUS | Status: DC | PRN
  Administered 2024-04-22: 140 mg via INTRAVENOUS

## 2024-04-22 MED ORDER — ONDANSETRON HCL 4 MG/2ML IJ SOLN: 4.0000 mg | Freq: Once | INTRAMUSCULAR | Status: DC | PRN

## 2024-04-22 MED ORDER — DOCUSATE SODIUM 100 MG PO CAPS
100.0000 mg | ORAL_CAPSULE | Freq: Two times a day (BID) | ORAL | Status: DC
  Administered 2024-04-22: 100 mg via ORAL
  Filled 2024-04-22: qty 1

## 2024-04-22 MED ORDER — AMISULPRIDE (ANTIEMETIC) 5 MG/2ML IV SOLN: 10.0000 mg | Freq: Once | INTRAVENOUS | Status: DC | PRN

## 2024-04-22 MED ORDER — LIDOCAINE 2% (20 MG/ML) 5 ML SYRINGE
INTRAMUSCULAR | Status: AC
  Filled 2024-04-22: qty 5

## 2024-04-22 MED ORDER — LIDOCAINE 2% (20 MG/ML) 5 ML SYRINGE
INTRAMUSCULAR | Status: AC
  Filled 2024-04-22: qty 10

## 2024-04-22 MED ORDER — MIDAZOLAM HCL 2 MG/2ML IJ SOLN
INTRAMUSCULAR | Status: AC
  Filled 2024-04-22: qty 2

## 2024-04-22 MED ORDER — CHLORHEXIDINE GLUCONATE 0.12 % MT SOLN
OROMUCOSAL | Status: AC
  Administered 2024-04-22: 15 mL via OROMUCOSAL
  Filled 2024-04-22: qty 15

## 2024-04-22 MED ORDER — PHENYLEPHRINE HCL-NACL 20-0.9 MG/250ML-% IV SOLN
INTRAVENOUS | Status: DC | PRN
  Administered 2024-04-22: 15 ug/min via INTRAVENOUS

## 2024-04-22 MED ORDER — LIDOCAINE-EPINEPHRINE 1 %-1:100000 IJ SOLN
INTRAMUSCULAR | Status: DC | PRN
  Administered 2024-04-22: 4 mL

## 2024-04-22 MED ORDER — ONDANSETRON HCL 4 MG/2ML IJ SOLN
INTRAMUSCULAR | Status: AC
  Filled 2024-04-22: qty 4

## 2024-04-22 MED ORDER — ACETAMINOPHEN 160 MG/5ML PO SOLN
650.0000 mg | ORAL | Status: DC
  Administered 2024-04-22 – 2024-04-23 (×3): 650 mg via ORAL
  Filled 2024-04-22 (×8): qty 20.3

## 2024-04-22 MED ORDER — ORAL CARE MOUTH RINSE: 15.0000 mL | Freq: Once | OROMUCOSAL | Status: AC

## 2024-04-22 MED ORDER — DEXTROSE 5 % IV SOLN
INTRAVENOUS | Status: DC | PRN
  Administered 2024-04-22: 3 g via INTRAVENOUS

## 2024-04-22 MED ORDER — FENTANYL CITRATE (PF) 250 MCG/5ML IJ SOLN
INTRAMUSCULAR | Status: DC | PRN
  Administered 2024-04-22: 100 ug via INTRAVENOUS
  Administered 2024-04-22: 50 ug via INTRAVENOUS
  Administered 2024-04-22: 100 ug via INTRAVENOUS

## 2024-04-22 MED ORDER — CELECOXIB 200 MG PO CAPS
200.0000 mg | ORAL_CAPSULE | Freq: Two times a day (BID) | ORAL | Status: DC
  Administered 2024-04-22: 200 mg via ORAL
  Filled 2024-04-22: qty 1

## 2024-04-22 MED ORDER — ENOXAPARIN SODIUM 40 MG/0.4ML IJ SOSY: 40.0000 mg | PREFILLED_SYRINGE | INTRAMUSCULAR | Status: DC

## 2024-04-22 MED ORDER — ACETAMINOPHEN 650 MG RE SUPP: 650.0000 mg | RECTAL | Status: DC

## 2024-04-22 MED ORDER — LIDOCAINE-EPINEPHRINE 1 %-1:100000 IJ SOLN
INTRAMUSCULAR | Status: AC
  Filled 2024-04-22: qty 1

## 2024-04-22 MED ORDER — CHLORHEXIDINE GLUCONATE 0.12 % MT SOLN: 15.0000 mL | Freq: Once | OROMUCOSAL | Status: AC

## 2024-04-22 MED ORDER — MIDAZOLAM HCL 2 MG/2ML IJ SOLN
INTRAMUSCULAR | Status: DC | PRN
  Administered 2024-04-22: 2 mg via INTRAVENOUS

## 2024-04-22 MED ORDER — SUCCINYLCHOLINE CHLORIDE 200 MG/10ML IV SOSY
PREFILLED_SYRINGE | INTRAVENOUS | Status: AC
  Filled 2024-04-22: qty 10

## 2024-04-22 MED ORDER — PROPOFOL 10 MG/ML IV BOLUS
INTRAVENOUS | Status: AC
Start: 2024-04-22 — End: 2024-04-22
  Filled 2024-04-22: qty 20

## 2024-04-22 MED ORDER — LACTATED RINGERS IV SOLN: INTRAVENOUS | Status: DC

## 2024-04-22 MED ORDER — PANTOPRAZOLE SODIUM 40 MG PO TBEC: 40.0000 mg | DELAYED_RELEASE_TABLET | Freq: Every day | ORAL | Status: DC

## 2024-04-22 MED ORDER — HYDROMORPHONE HCL 1 MG/ML IJ SOLN
INTRAMUSCULAR | Status: DC | PRN
  Administered 2024-04-22: .5 mg via INTRAVENOUS

## 2024-04-22 MED ORDER — LOSARTAN POTASSIUM 50 MG PO TABS: 50.0000 mg | ORAL_TABLET | Freq: Every day | ORAL | Status: DC

## 2024-04-22 MED ORDER — ONDANSETRON HCL 4 MG/2ML IJ SOLN
INTRAMUSCULAR | Status: DC | PRN
  Administered 2024-04-22: 4 mg via INTRAVENOUS

## 2024-04-22 MED ORDER — PROPOFOL 500 MG/50ML IV EMUL
INTRAVENOUS | Status: DC | PRN
  Administered 2024-04-22: 100 ug/kg/min via INTRAVENOUS
  Administered 2024-04-22: 50 ug/kg/min via INTRAVENOUS
  Administered 2024-04-22: 100 ug/kg/min via INTRAVENOUS

## 2024-04-22 MED ORDER — CEFAZOLIN SODIUM-DEXTROSE 3-4 GM/150ML-% IV SOLN
INTRAVENOUS | Status: AC
  Filled 2024-04-22: qty 150

## 2024-04-22 MED ORDER — ACETAMINOPHEN 500 MG PO TABS
ORAL_TABLET | ORAL | Status: AC
  Administered 2024-04-22: 1000 mg via ORAL
  Filled 2024-04-22: qty 2

## 2024-04-22 MED ORDER — DEXMEDETOMIDINE HCL IN NACL 80 MCG/20ML IV SOLN
INTRAVENOUS | Status: DC | PRN
  Administered 2024-04-22 (×5): 8 ug via INTRAVENOUS

## 2024-04-22 MED ORDER — FENTANYL CITRATE (PF) 250 MCG/5ML IJ SOLN
INTRAMUSCULAR | Status: AC
  Filled 2024-04-22: qty 5

## 2024-04-22 MED ORDER — ACETAMINOPHEN 500 MG PO TABS: 1000.0000 mg | ORAL_TABLET | Freq: Once | ORAL | Status: AC

## 2024-04-22 MED ORDER — PROPOFOL 10 MG/ML IV BOLUS
INTRAVENOUS | Status: DC | PRN
  Administered 2024-04-22: 200 mg via INTRAVENOUS
  Administered 2024-04-22: 50 mg via INTRAVENOUS

## 2024-04-22 MED ORDER — ROCURONIUM BROMIDE 10 MG/ML (PF) SYRINGE
PREFILLED_SYRINGE | INTRAVENOUS | Status: AC
  Filled 2024-04-22: qty 10

## 2024-04-22 MED ORDER — DEXAMETHASONE SODIUM PHOSPHATE 10 MG/ML IJ SOLN
INTRAMUSCULAR | Status: AC
Start: 2024-04-22 — End: 2024-04-22
  Filled 2024-04-22: qty 2

## 2024-04-22 MED ORDER — LIDOCAINE 2% (20 MG/ML) 5 ML SYRINGE
INTRAMUSCULAR | Status: DC | PRN
  Administered 2024-04-22: 100 mg via INTRAVENOUS

## 2024-04-22 MED ORDER — HYDROMORPHONE HCL 1 MG/ML IJ SOLN
INTRAMUSCULAR | Status: AC
  Filled 2024-04-22: qty 0.5

## 2024-04-22 MED ORDER — SENNA 8.6 MG PO TABS
1.0000 | ORAL_TABLET | Freq: Two times a day (BID) | ORAL | Status: DC
  Administered 2024-04-22: 8.6 mg via ORAL
  Filled 2024-04-22: qty 1

## 2024-04-22 SURGICAL SUPPLY — 50 items
BAG COUNTER SPONGE SURGICOUNT (BAG) ×1 IMPLANT
BLADE SURG 15 STRL LF DISP TIS (BLADE) IMPLANT
CANISTER SUCTION 3000ML PPV (SUCTIONS) ×1 IMPLANT
CLIP TI MEDIUM 24 (CLIP) ×1 IMPLANT
CLIP TI WIDE RED SMALL 24 (CLIP) ×1 IMPLANT
CNTNR URN SCR LID CUP LEK RST (MISCELLANEOUS) IMPLANT
CORD BIPOLAR FORCEPS 12FT (ELECTRODE) ×1 IMPLANT
COVER SURGICAL LIGHT HANDLE (MISCELLANEOUS) ×1 IMPLANT
DERMABOND ADVANCED .7 DNX12 (GAUZE/BANDAGES/DRESSINGS) ×1 IMPLANT
DERMABOND ADVANCED .7 DNX6 (GAUZE/BANDAGES/DRESSINGS) IMPLANT
DRAIN CHANNEL 19F RND (DRAIN) IMPLANT
DRAIN JP 10F RND RADIO (DRAIN) IMPLANT
DRAIN JP 15F RND TROCAR (DRAIN) IMPLANT
DRAPE INCISE IOBAN 66X45 STRL (DRAPES) ×1 IMPLANT
ELECT COATED BLADE 2.86 ST (ELECTRODE) ×1 IMPLANT
ELECTRODE REM PT RTRN 9FT ADLT (ELECTROSURGICAL) ×1 IMPLANT
EVACUATOR SILICONE 100CC (DRAIN) IMPLANT
GAUZE 4X4 16PLY ~~LOC~~+RFID DBL (SPONGE) ×1 IMPLANT
GLOVE BIO SURGEON STRL SZ 6.5 (GLOVE) ×1 IMPLANT
GLOVE BIO SURGEON STRL SZ7.5 (GLOVE) ×1 IMPLANT
GOWN STRL REUS W/ TWL LRG LVL3 (GOWN DISPOSABLE) ×2 IMPLANT
HEMOSTAT SNOW SURGICEL 2X4 (HEMOSTASIS) ×1 IMPLANT
HOOK RETRACT STAY FNGR BLNT 12 (MISCELLANEOUS) IMPLANT
KIT BASIN OR (CUSTOM PROCEDURE TRAY) ×1 IMPLANT
KIT TURNOVER KIT B (KITS) ×1 IMPLANT
LOCATOR NERVE 3 VOLT (DISPOSABLE) IMPLANT
MARKER SKIN DUAL TIP RULER LAB (MISCELLANEOUS) ×1 IMPLANT
NDL HYPO 25GX1X1/2 BEV (NEEDLE) ×1 IMPLANT
NEEDLE HYPO 25GX1X1/2 BEV (NEEDLE) ×1 IMPLANT
NS IRRIG 1000ML POUR BTL (IV SOLUTION) ×1 IMPLANT
PAD ARMBOARD POSITIONER FOAM (MISCELLANEOUS) ×2 IMPLANT
PENCIL SMOKE EVACUATOR (MISCELLANEOUS) ×1 IMPLANT
POSITIONER HEAD DONUT 9IN (MISCELLANEOUS) ×1 IMPLANT
PROBE NERVBE PRASS .33 (MISCELLANEOUS) ×1 IMPLANT
SET WALTER ACTIVATION W/DRAPE (SET/KITS/TRAYS/PACK) IMPLANT
SPONGE INTESTINAL PEANUT (DISPOSABLE) ×1 IMPLANT
STAPLER SKIN PROX 35W (STAPLE) IMPLANT
SUT MNCRL AB 4-0 PS2 18 (SUTURE) IMPLANT
SUT MON AB 5-0 PS2 18 (SUTURE) IMPLANT
SUT PLAIN GUT FAST 5-0 (SUTURE) IMPLANT
SUT PROLENE 3 0 PS 2 (SUTURE) IMPLANT
SUT PROLENE 3 0 SH DA (SUTURE) IMPLANT
SUT SILK 2 0 REEL (SUTURE) ×1 IMPLANT
SUT SILK 2 0 SH (SUTURE) IMPLANT
SUT SILK 2 0 SH CR/8 (SUTURE) ×1 IMPLANT
SUT SILK 3 0 REEL (SUTURE) IMPLANT
SUT VIC AB 3-0 SH 8-18 (SUTURE) ×1 IMPLANT
SUT VIC AB 4-0 RB1 18 (SUTURE) ×1 IMPLANT
TOWEL GREEN STERILE FF (TOWEL DISPOSABLE) ×1 IMPLANT
TRAY ENT MC OR (CUSTOM PROCEDURE TRAY) ×1 IMPLANT

## 2024-04-22 NOTE — Transfer of Care (Addendum)
 Immediate Anesthesia Transfer of Care Note  Patient: Jamarkus Lisbon Geil  Procedure(s) Performed: LOBECTOMY, THYROID  (Right)  Patient Location: PACU  Anesthesia Type:General  Level of Consciousness: awake and sedated  Airway & Oxygen Therapy: Patient Spontanous Breathing and Patient connected to face mask oxygen  Post-op Assessment: Report given to RN and Post -op Vital signs reviewed and stable  Post vital signs: Reviewed and stable  Last Vitals:  Vitals Value Taken Time  BP 110/66 04/22/24 17:30  Temp    Pulse 95 04/22/24 17:36  Resp 14 04/22/24 17:36  SpO2 92 % 04/22/24 17:36  Vitals shown include unfiled device data.  Last Pain:  Vitals:   04/22/24 1030  TempSrc:   PainSc: 0-No pain         Complications: No notable events documented.

## 2024-04-22 NOTE — Anesthesia Procedure Notes (Signed)
 Procedure Name: Intubation Date/Time: 04/22/2024 1:18 PM  Performed by: Lamar Lucie DASEN, CRNAPre-anesthesia Checklist: Patient identified, Emergency Drugs available, Suction available and Patient being monitored Patient Re-evaluated:Patient Re-evaluated prior to induction Oxygen Delivery Method: Circle system utilized Preoxygenation: Pre-oxygenation with 100% oxygen Induction Type: IV induction, Rapid sequence and Cricoid Pressure applied Laryngoscope Size: Glidescope and 4 Grade View: Grade I Tube type: Oral Tube size: 8.0 mm Number of attempts: 1 Airway Equipment and Method: Stylet and Oral airway Placement Confirmation: ETT inserted through vocal cords under direct vision, positive ETCO2 and breath sounds checked- equal and bilateral Secured at: 26 cm Tube secured with: Tape Dental Injury: Teeth and Oropharynx as per pre-operative assessment  Comments: NIMS tube placed with glidescope under the supervision of the ENT doctor for blue line placement

## 2024-04-22 NOTE — Op Note (Signed)
 Otolaryngology Operative note  Thomas [name] Date/Time of Admission: 04/22/2024  9:37 AM  CSN: [id number];MRN:[id number]  DOB: 17-Apr-1981 Age: 43 y.o. Location: [facility] OR    Pre-Op Diagnosis: Right Thyroid  Nodule, risk for malignancy  Post-Op Diagnosis: Same  Procedure: Procedure(s): Right Thyroid  Lobectomy with Nerve monitoring - CPT 39779  Surgeon: Eldora Blanch, MD  Anesthesia type:  General endotracheal  Anesthesiologist: Anesthesiologist: Peggye Delon Brunswick, MD; Cleotilde Butler Dade, MD; Erma Thom SAUNDERS, MD CRNA: Lamar Lucie DASEN, CRNA; Mannie Krystal LABOR, CRNA   Staff: See Log  Implants: None  Specimens: Right thyroid  lobe - permanent  EBL:  100 mL  Drains: None  Post-op disposition and condition: PACU, hemodynamically stable  Complications: None apparent  Indications and consent:  Thomas [name] is a 43 y.o. male with diagnoses above with Afirma testing showing 75% risk of malignancy. The patient's options were discussed, including risks/benefits/alternatives for each option. Patient expressed understanding, and despite these risks, consented and decided to proceed with above procedures. Informed consent was signed before proceeding.  Findings:  Successful right thyroidectomy Recurrent laryngeal nerve was identified, preserved, and noted to be stimulating appropriately  Procedure: After being properly identified in the preoperative holding area, the patient was brought into the operating suite.  Patient was placed supine on the operating table.  A pre-procedural time-out was performed. Pre-operative antibiotics and steroids were administered. After induction of general anesthesia, the patient was successfully intubated with a NIM endotracheal tube, confirmed with CO2 return. Proper tube placement was confirmed. The patient was prepped and draped in the usual fashion for this procedure.   Using a marking pen, a line was drawn horizontally in a natural neck crease.  The skin and subcutaneous tissues were sharply dissected to the level of the platysma. The platysma itself was then incised and sub-platysmal flaps were elevated. The midline raphe between the strap muscles was identified. This was divided from the thyroid  cartilage to near the sternal notch. The strap muscles were retracted laterally exposing the right thyroid  lobe. The thyroid  lobe was retracted medially and the strap muscles overlying this lobe were carefully dissected free. The superior pole was identified and with the thyroid  retracted inferiorly, the vessels were progressively ligated with 2-0 silk suture and hemo-clips. Care was taken to stay very near the gland to prevent injury to the superior laryngeal nerve. With the superior pole freed, attachments along the mid thyroid  were released and the lobe was retracted medially. Working lateral to the cricothyroid joint, the recurrent laryngeal nerve was identified. The nerve was stimulated and was noted to be stimulate appropriately. The nerve was then dissecte along its course toward the cricothyroid membrane. Soft tissue attachments were lifted off the nerve further freeing the gland and allowing it to roll medially. The inferior pole was then controlled by identifying, and ligating inferior pole vessels using 2-0 silk ties and hemo-clips. Superior and inferior parathyroid gland candidates were identified and preserved.  Finally, the gland was lifted off the trachea and berry's ligament was ligated and the isthmus was lifted off the trachea and clamped. The isthmus was then freed and the remnant thyroid  lobe was tagged with a running 3-0 prolene suture. The specimen was passed off the field as a permanent specimen.   Bipolar cautery was used for hemostasis. Hemostasis was then obtained, confirmed with valsalva, and the wound bed was copiously irrigated with saline. Additional hemostatic agent (Surgicel SNoW) was used. 3-0 vicryl interrupted sutures were  use to close the strap muscles. 3-0 vicryl  interrupted sutures were used to close the platysma and inverted 4-0 vicryl sutures for the deep dermis in an interrupted fashion. A 4-0 monocryl suture was used to approximate the skin in a running subcuticular fashion.  With the surgical portion of the procedure complete, all instrumentation was then removed from the operative field. The patient's skin was cleaned.  Patient was returned to the care of the anesthesia team.  Patient was then weaned from the anesthetic and transported to the PACU in stable condition.  Thomas [name] B Thomas [name]

## 2024-04-22 NOTE — H&P (Signed)
 Pre-Operative H&P - Day Of Surgery Patient Name: Thomas [name] Date:   04/22/2024  HPI: Thomas [name] is a 43 y.o. male who presents today for operative treatment of right thyroid  nodule. Patient denies recent significant changes to health or significant new medications or physiologic change in condition which would immediately impact plans. No new types of therapy has been initiated that would change the plan or the appropriateness of the plan.   ROS:  A complete review of systems was obtained and is otherwise negative.   PMH:  Past Medical History:  Diagnosis Date   Diabetes mellitus without complication (HCC)    type 2   Dyspnea    albuterol  inhaler prn   GERD (gastroesophageal reflux disease)    omeprazole    History of kidney stones 03/2015   in [place] - passed stone   HLD (hyperlipidemia)    crestor    Hypertension    losartan    Plantar fasciitis    hx - wears insoles in shoes   Pneumonia    x several   Thyroid  nodule 03/2024   right    PSH:  Past Surgical History:  Procedure Laterality Date   broken arm Left    as a child    MEDS:   Current Facility-Administered Medications:    acetaminophen  (TYLENOL ) 500 MG tablet, , , ,    acetaminophen  (TYLENOL ) tablet 1,000 mg, 1,000 mg, Oral, Once, Corinne Garnette BRAVO, MD   chlorhexidine  (PERIDEX ) 0.12 % solution, , , ,   ALLERGIES: Penicillins and Amoxicillin  EXAM: Vitals: BP 123/89   Pulse 82   Temp 98.1 F (36.7 C) (Oral)   Resp 18   Ht 6' 5 (1.956 m)   Wt (!) 140.6 kg   SpO2 97%   BMI 36.76 kg/m   General Awake, at baseline alertness.   HEENT No scleral icterus or conjunctival hemorrhage. Globe position appears normal. External ears  normal. Nose patent without rhinorrhea. No lymphadenopathy. No thyromegaly  Cardiovascular No cyanosis.  Pulmonary No audible stridor. Breathing easily with no labor.  Neuro Symmetric facial movement.   Psychiatry Appropriate affect and mood.  Skin No scars or lesions on face or neck.   Extermities Moves all extremities with normal range of motion.   Other Findings None.   Assessment & Plan: Thomas [name] has diagnoses of right thyroid  nodule and will go to the OR today for right thyroid  lobectomy with nerve monitoring. Informed consent was obtained and available in EMR today. All questions have been answered, and risks/benefits/alternatives of procedure as noted in the consent were discussed in a quiet area. Questions were invited and answered. The patient expressed understanding, provided consent and wished to proceed despite risks.  Risks of thyroidectomy were discussed including pain, bleeding, infection, injury to recurrent laryngeal nerve including vocal fold paralysis and hoarseness, lack of improvement, need for further procedures, change in swallowing, hypocalcemia (not with lobectomy), injury to trachea or surrounding great vessels including pulmonary complications, and discovery of malignancy. Patient understands these risks, and despite these risks, wishes to proceed.    Eldora KATHEE Blanch 04/22/2024 11:48 AM

## 2024-04-23 ENCOUNTER — Encounter (HOSPITAL_COMMUNITY): Payer: Self-pay | Admitting: Otolaryngology

## 2024-04-23 DIAGNOSIS — K219 Gastro-esophageal reflux disease without esophagitis: Secondary | ICD-10-CM | POA: Diagnosis not present

## 2024-04-23 DIAGNOSIS — E669 Obesity, unspecified: Secondary | ICD-10-CM | POA: Diagnosis not present

## 2024-04-23 DIAGNOSIS — E041 Nontoxic single thyroid nodule: Secondary | ICD-10-CM | POA: Diagnosis not present

## 2024-04-23 DIAGNOSIS — Z7984 Long term (current) use of oral hypoglycemic drugs: Secondary | ICD-10-CM | POA: Diagnosis not present

## 2024-04-23 DIAGNOSIS — Z6836 Body mass index (BMI) 36.0-36.9, adult: Secondary | ICD-10-CM | POA: Diagnosis not present

## 2024-04-23 DIAGNOSIS — C73 Malignant neoplasm of thyroid gland: Secondary | ICD-10-CM | POA: Diagnosis not present

## 2024-04-23 DIAGNOSIS — E119 Type 2 diabetes mellitus without complications: Secondary | ICD-10-CM | POA: Diagnosis not present

## 2024-04-23 DIAGNOSIS — I10 Essential (primary) hypertension: Secondary | ICD-10-CM | POA: Diagnosis not present

## 2024-04-23 DIAGNOSIS — Z87891 Personal history of nicotine dependence: Secondary | ICD-10-CM | POA: Diagnosis not present

## 2024-04-23 MED ORDER — ONDANSETRON 4 MG PO TBDP: 4.0000 mg | ORAL_TABLET | Freq: Three times a day (TID) | ORAL | 0 refills | Status: DC | PRN

## 2024-04-23 MED ORDER — IBUPROFEN 200 MG PO TABS
400.0000 mg | ORAL_TABLET | Freq: Four times a day (QID) | ORAL | 2 refills | Status: DC
Start: 2024-04-23 — End: 2024-05-04

## 2024-04-23 MED ORDER — OXYCODONE HCL 5 MG PO TABS: 5.0000 mg | ORAL_TABLET | ORAL | 0 refills | Status: AC | PRN

## 2024-04-23 MED ORDER — ACETAMINOPHEN 500 MG PO TABS: 1000.0000 mg | ORAL_TABLET | Freq: Four times a day (QID) | ORAL | 0 refills | Status: AC

## 2024-04-23 NOTE — Anesthesia Postprocedure Evaluation (Signed)
 Anesthesia Post Note  Patient: Thomas [name]  Procedure(s) Performed: LOBECTOMY, THYROID  (Right)     Patient location during evaluation: PACU Anesthesia Type: General Level of consciousness: awake and alert Pain management: pain level controlled Vital Signs Assessment: post-procedure vital signs reviewed and stable Respiratory status: spontaneous breathing, nonlabored ventilation and respiratory function stable Cardiovascular status: blood pressure returned to baseline and stable Postop Assessment: no apparent nausea or vomiting Anesthetic complications: no   No notable events documented.  Last Vitals:  Vitals:   04/23/24 0029 04/23/24 0417  BP: 126/83 132/82  Pulse: 90 88  Resp: 18 16  Temp: 37 C 36.9 C  SpO2: 93% 96%    Last Pain:  Vitals:   04/23/24 0832  TempSrc:   PainSc: 0-No pain   Pain Goal:                   Butler Levander Pinal

## 2024-04-23 NOTE — Plan of Care (Signed)
  Problem: Education: Goal: Knowledge of General Education information will improve Description: Including pain rating scale, medication(s)/side effects and non-pharmacologic comfort measures Outcome: Progressing   Problem: Health Behavior/Discharge Planning: Goal: Ability to manage health-related needs will improve Outcome: Progressing   Problem: Clinical Measurements: Goal: Ability to maintain clinical measurements within normal limits will improve Outcome: Progressing Goal: Will remain free from infection Outcome: Progressing   Problem: Activity: Goal: Risk for activity intolerance will decrease Outcome: Progressing   Problem: Nutrition: Goal: Adequate nutrition will be maintained Outcome: Progressing   Problem: Pain Managment: Goal: General experience of comfort will improve and/or be controlled Outcome: Progressing   Problem: Safety: Goal: Ability to remain free from injury will improve Outcome: Progressing   Problem: Skin Integrity: Goal: Risk for impaired skin integrity will decrease Outcome: Progressing   Problem: Coping: Goal: Ability to adjust to condition or change in health will improve Outcome: Progressing   Problem: Skin Integrity: Goal: Risk for impaired skin integrity will decrease Outcome: Progressing   Problem: Tissue Perfusion: Goal: Adequacy of tissue perfusion will improve Outcome: Progressing

## 2024-04-23 NOTE — Discharge Summary (Signed)
 Physician Discharge Summary  Patient ID: Thomas [name] MRN: [id number] DOB/AGE: 1981/06/05 43 y.o.  Admit date: 04/22/2024 Discharge date: 04/23/2024  Admission Diagnoses:  Principal Problem:   Thyroid  nodule Active Problems:   Right thyroid  nodule   Discharge Diagnoses:  Same  Surgeries: Procedure(s): RIGHT LOBECTOMY, THYROID  on 04/22/2024   Consultants: None  Discharged Condition: Improved  Hospital Course: LEONEL MCCOLLUM is an 43 y.o. male who was admitted 04/22/2024 after right thyroid  lobectomy  They were brought to the operating room on 04/22/2024 and underwent the above named procedures.  Mr. Hermiz did well in recovery and was admitted for overnight observation. He did well overnight and had met his post-operative milestones including PO intake day after surgery. Exam as below. Given this, joint decision was made for discharge.  Physical Exam:  General: Awake and alert, no acute distress Neck: soft, flat, incision c/d/I Throat: Voice strong, clear OP Respiratory: Respiratory effort is normal. Lungs clear to auscultation.  Recent vital signs:  Vitals:   04/23/24 0029 04/23/24 0417  BP: 126/83 132/82  Pulse: 90 88  Resp: 18 16  Temp: 98.6 F (37 C) 98.4 F (36.9 C)  SpO2: 93% 96%    Recent laboratory studies:  Results for orders placed or performed during the hospital encounter of 04/22/24  Glucose, capillary   Collection Time: 04/22/24 10:00 AM  Result Value Ref Range   Glucose-Capillary 130 (H) 70 - 99 mg/dL  Glucose, capillary   Collection Time: 04/22/24 11:58 AM  Result Value Ref Range   Glucose-Capillary 99 70 - 99 mg/dL  Glucose, capillary   Collection Time: 04/22/24  5:58 PM  Result Value Ref Range   Glucose-Capillary 181 (H) 70 - 99 mg/dL  Glucose, capillary   Collection Time: 04/22/24  6:55 PM  Result Value Ref Range   Glucose-Capillary 174 (H) 70 - 99 mg/dL    Discharge Medications:   Allergies as of 04/23/2024       Reactions    Penicillins Hives, Rash, Other (See Comments)   As a child   Amoxicillin Hives, Rash        Medication List     TAKE these medications    acetaminophen  500 MG tablet Commonly known as: TYLENOL  Take 2 tablets (1,000 mg total) by mouth every 6 (six) hours for 7 days.   albuterol  108 (90 Base) MCG/ACT inhaler Commonly known as: VENTOLIN  HFA Inhale 2 puffs into the lungs every 6 (six) hours as needed for wheezing or shortness of breath.   cholecalciferol 25 MCG (1000 UNIT) tablet Commonly known as: VITAMIN D3 Take 4,000 Units by mouth daily.   Clomid 50 MG tablet Generic drug: clomiPHENE Take 25 mg by mouth daily.   empagliflozin  25 MG Tabs tablet Commonly known as: Jardiance  Take 1 tablet (25 mg total) by mouth daily before breakfast.   fenofibrate  145 MG tablet Commonly known as: TRICOR  Take 1 tablet (145 mg total) by mouth daily.   FreeStyle Libre 3 Sensor Misc PLACE 1 SENSOR ONTO THE SKIN EVERY 14 DAYS TO CHECK GLUCOSE   ibuprofen  200 MG tablet Commonly known as: Motrin  IB Take 2 tablets (400 mg total) by mouth every 6 (six) hours.   losartan  50 MG tablet Commonly known as: COZAAR  Take 1 tablet (50 mg total) by mouth daily.   metFORMIN  1000 MG tablet Commonly known as: GLUCOPHAGE  Take 1 tablet (1,000 mg total) by mouth 2 (two) times daily with a meal. What changed: when to take this   multivitamin  with minerals Tabs tablet Take 1 tablet by mouth daily.   omeprazole  20 MG capsule Commonly known as: PRILOSEC Take 1 capsule (20 mg total) by mouth daily.   ondansetron  4 MG disintegrating tablet Commonly known as: Zofran  ODT Take 1 tablet (4 mg total) by mouth every 8 (eight) hours as needed for nausea or vomiting. What changed: Another medication with the same name was added. Make sure you understand how and when to take each.   ondansetron  4 MG disintegrating tablet Commonly known as: ZOFRAN -ODT Take 1 tablet (4 mg total) by mouth every 8 (eight) hours  as needed for nausea or vomiting. What changed: You were already taking a medication with the same name, and this prescription was added. Make sure you understand how and when to take each.   oxyCODONE  5 MG immediate release tablet Commonly known as: Oxy IR/ROXICODONE  Take 1-2 tablets (5-10 mg total) by mouth every 4 (four) hours as needed for up to 7 days for moderate pain (pain score 4-6) (5 mg for moderate pain, 10 mg for severe).   rosuvastatin  10 MG tablet Commonly known as: CRESTOR  TAKE 1 TABLET BY MOUTH EVERY DAY   Rybelsus  7 MG Tabs Generic drug: Semaglutide  Take 1 tablet (7 mg total) by mouth daily.   sucralfate  1 g tablet Commonly known as: CARAFATE  TAKE 1 TABLET (1 G TOTAL) BY MOUTH 4 TIMES A DAY WITH MEALS AND AT BEDTIME        Diagnostic Studies: No results found.  Disposition: Discharge disposition: 01-Home or Self Care       Discharge Instructions     Diet general   Complete by: As directed    Increase activity slowly   Complete by: As directed    No wound care   Complete by: As directed           Signed: Eldora KATHEE Blanch 04/23/2024, 7:20 AM

## 2024-04-23 NOTE — TOC Transition Note (Signed)
 Transition of Care [facility]) - Discharge Note   Patient Details  Name: [name] MRN: [id number] Date of Birth: 12-10-1980  Transition of Care [facility] CM/SW Contact:  Roxie KANDICE Stain, RN Phone Number: 04/23/2024, 8:48 AM   Clinical Narrative:    Thomas [name] is stable to discharge home.  No TOC needs at this time.    Final next level of care: Home/Self Care Barriers to Discharge: Barriers Resolved   Patient Goals and CMS Choice Patient states their goals for this hospitalization and ongoing recovery are:: return home          Discharge Placement               home        Discharge Plan and Services Additional resources added to the After Visit Summary for                                       Social Drivers of Health (SDOH) Interventions SDOH Screenings   Food Insecurity: No Food Insecurity (04/23/2024)  Housing: Low Risk  (04/23/2024)  Transportation Needs: No Transportation Needs (04/23/2024)  Utilities: Not At Risk (04/23/2024)  Depression (PHQ2-9): Low Risk  (03/04/2024)  Social Connections: Unknown (02/09/2022)   Received from [facility]  Tobacco Use: Medium Risk (04/22/2024)     Readmission Risk Interventions     No data to display

## 2024-04-23 NOTE — Progress Notes (Signed)
 Patient discharged to home, reviewed AVS and answered all questions. Family to provide transportation. Volunteers called to assist patient to the exit.

## 2024-04-23 NOTE — Discharge Instructions (Signed)
  Surgery Discharge Instructions:  Call clinic or return to ED if you: - develop a fever greater than 101.4 - have shaking chills or are feeling ill - become short of breath - have uncontrollable nausea or vomiting - can't hold down food or liquids or feel as though you are getting dehydrated - have leakage or drainage from wound - urine output of less than 30cc/hr for 12 hours - develop redness, pain at incision(s) or wound opens up/separates - any other acute events, problems, or concerns  Wound Care/Dressings/Drain Instructions:  - To take care of your incision/cut:  - It is ok to shower in 48 hours, but avoid rubbing the incision or submerging under water like in a bath or swimming pool - Your stitches are absorbable and will dissolve on their own   - Your incision is closed with skin glue. It will peel off on its own in 1-2 weeks. You can let water run over it in 48 hours like in a shower and gently pat dry. Avoid rubbing the incision or submerging under like like a bath or swimming pool.   Medications: - Resume your regular home medications except as detailed in the medication reconciliation.  - For pain, take tylenol  1000mg  every 6 hours alternating with Ibuprofen  400mg  every 6 hours. If that is not sufficient, a stronger pain medication has been prescribed to you - take oxycodone  5 - 10 mg (5mg  for moderate pain, 10 mg for severe) every 6 hours. Do not mix with any other narcotic medication.  Follow Up:  - A follow up appointment should be scheduled for you after discharge. However, If you do not hear about your appoinment in 3 business days, please call the provided surgery clinic number and confirm/schedule your appointment.    Activity/Restrictions:  - Resume your regular activities, as tolerated. Avoid heavy lifting or straining (more than 5 lbs) for 10 days.  Diet: - Resume your regular diet, as tolerated  Additional Instructions: - Please take an over the counter  stool softener while taking narcotic pain medication - DO NOT MIX NARCOTIC PAIN MEDICATIONS OR TAKE NARCOTIC PRESCRIPTIONS AT THE SAME TIME - DO NOT DRIVE OR OPERATE HEAVY MACHINERY WHILE ON NARCOTICS  - DO NOT TAKE MORE THAN 4 GRAMS (4000mg ) OF TYLENOL  (ACETAMINOPHEN ) IN 24 HOURS

## 2024-04-27 ENCOUNTER — Encounter (INDEPENDENT_AMBULATORY_CARE_PROVIDER_SITE_OTHER): Payer: Self-pay | Admitting: Physician Assistant

## 2024-04-27 ENCOUNTER — Ambulatory Visit (INDEPENDENT_AMBULATORY_CARE_PROVIDER_SITE_OTHER): Admitting: Physician Assistant

## 2024-04-27 VITALS — BP 122/83 | HR 92

## 2024-04-27 DIAGNOSIS — Z9889 Other specified postprocedural states: Secondary | ICD-10-CM

## 2024-04-27 DIAGNOSIS — E041 Nontoxic single thyroid nodule: Secondary | ICD-10-CM

## 2024-04-27 NOTE — Progress Notes (Signed)
 Dear Dr. Maryanne, Here is my assessment for our mutual patient, Thomas [name]. Thank you for allowing me the opportunity to care for your patient. Please do not hesitate to contact me should you have any other questions. Sincerely, Chyrl Cohen PA-C  Otolaryngology Clinic Note Referring provider: Dr. Maryanne HPI:  Thomas [name] is a 43 y.o. male kindly referred by Dr. Maryanne   The patient is a 43 year old gentleman seen in our office status post right thyroid  lobectomy on 04/22/2024 by Dr. Tobie.  Post operatively the patient notes he has done well.  He denies any changes to his voice, no significant pain, he does note some tightness in the neck.  No fevers or neurologic deficits.  Does note some swelling that appeared around the incision site after surgery that has remained the same with no surrounding redness discharge or warmth.  No issues with the incision site.   Independent Review of Additional Tests or Records:  Discharge summary 04/23/2024   PMH/Meds/All/SocHx/FamHx/ROS:   Past Medical History:  Diagnosis Date   Diabetes mellitus without complication (HCC)    type 2   Dyspnea    albuterol  inhaler prn   GERD (gastroesophageal reflux disease)    omeprazole    History of kidney stones 03/2015   in [place] - passed stone   HLD (hyperlipidemia)    crestor    Hypertension    losartan    Plantar fasciitis    hx - wears insoles in shoes   Pneumonia    x several   Thyroid  nodule 03/2024   right     Past Surgical History:  Procedure Laterality Date   broken arm Left    as a child   THYROID  LOBECTOMY Right 04/22/2024   Procedure: LOBECTOMY, THYROID ;  Surgeon: Tobie Eldora NOVAK, MD;  Location: [facility] OR;  Service: ENT;  Laterality: Right;    Family History  Problem Relation Age of Onset   Diabetes Father    Kidney Stones Father    Hypertension Father    Heart disease Maternal Grandmother    COPD Maternal Grandmother    Heart disease Maternal Grandfather    Hypertension  Maternal Grandfather    Diabetes Paternal Grandmother    Diabetes Paternal Grandfather    Heart disease Paternal Grandfather      Social Connections: Unknown (02/09/2022)   Received from [facility]   Social Network    Social Network: Not on file      Current Outpatient Medications:    acetaminophen  (TYLENOL ) 500 MG tablet, Take 2 tablets (1,000 mg total) by mouth every 6 (six) hours for 7 days., Disp: 60 tablet, Rfl: 0   albuterol  (VENTOLIN  HFA) 108 (90 Base) MCG/ACT inhaler, Inhale 2 puffs into the lungs every 6 (six) hours as needed for wheezing or shortness of breath., Disp: 8 g, Rfl: 2   cholecalciferol (VITAMIN D3) 25 MCG (1000 UNIT) tablet, Take 4,000 Units by mouth daily., Disp: , Rfl:    CLOMID 50 MG tablet, Take 25 mg by mouth daily., Disp: , Rfl:    Continuous Glucose Sensor (FREESTYLE LIBRE 3 SENSOR) MISC, PLACE 1 SENSOR ONTO THE SKIN EVERY 14 DAYS TO CHECK GLUCOSE, Disp: 6 each, Rfl: 3   empagliflozin  (JARDIANCE ) 25 MG TABS tablet, Take 1 tablet (25 mg total) by mouth daily before breakfast., Disp: 90 tablet, Rfl: 3   fenofibrate  (TRICOR ) 145 MG tablet, Take 1 tablet (145 mg total) by mouth daily., Disp: 90 tablet, Rfl: 3   ibuprofen  (MOTRIN  IB) 200 MG tablet, Take  2 tablets (400 mg total) by mouth every 6 (six) hours., Disp: 100 tablet, Rfl: 2   losartan  (COZAAR ) 50 MG tablet, Take 1 tablet (50 mg total) by mouth daily., Disp: 90 tablet, Rfl: 3   metFORMIN  (GLUCOPHAGE ) 1000 MG tablet, Take 1 tablet (1,000 mg total) by mouth 2 (two) times daily with a meal. (Patient taking differently: Take 1,000 mg by mouth daily with breakfast.), Disp: 180 tablet, Rfl: 3   Multiple Vitamin (MULTIVITAMIN WITH MINERALS) TABS tablet, Take 1 tablet by mouth daily., Disp: , Rfl:    omeprazole  (PRILOSEC) 20 MG capsule, Take 1 capsule (20 mg total) by mouth daily., Disp: 90 capsule, Rfl: 3   ondansetron  (ZOFRAN  ODT) 4 MG disintegrating tablet, Take 1 tablet (4 mg total) by mouth every 8 (eight)  hours as needed for nausea or vomiting., Disp: 30 tablet, Rfl: 1   ondansetron  (ZOFRAN -ODT) 4 MG disintegrating tablet, Take 1 tablet (4 mg total) by mouth every 8 (eight) hours as needed for nausea or vomiting., Disp: 20 tablet, Rfl: 0   oxyCODONE  (OXY IR/ROXICODONE ) 5 MG immediate release tablet, Take 1-2 tablets (5-10 mg total) by mouth every 4 (four) hours as needed for up to 7 days for moderate pain (pain score 4-6) (5 mg for moderate pain, 10 mg for severe)., Disp: 30 tablet, Rfl: 0   rosuvastatin  (CRESTOR ) 10 MG tablet, TAKE 1 TABLET BY MOUTH EVERY DAY, Disp: 90 tablet, Rfl: 3   Semaglutide  (RYBELSUS ) 7 MG TABS, Take 1 tablet (7 mg total) by mouth daily. (Patient not taking: Reported on 04/27/2024), Disp: 30 tablet, Rfl: 3   sucralfate  (CARAFATE ) 1 g tablet, TAKE 1 TABLET (1 G TOTAL) BY MOUTH 4 TIMES A DAY WITH MEALS AND AT BEDTIME (Patient not taking: Reported on 04/27/2024), Disp: 90 tablet, Rfl: 0   Physical Exam:   BP 122/83   Pulse 92   SpO2 96%   Pertinent Findings  CN II-XII intact Voice strong  Incision site clean dry intact with dermabond in place, no surrounding redness or warmth. Firm swelling at the operative site.  Tongue midline  No respiratory distress or stridor  Seprately Identifiable Procedures:  None  Impression & Plans:  Thomas [name] is a 43 y.o. male with the following   Status post right thyroid  lobectomy-  43 year old gentleman seen postoperatively today.  He is doing well, voice is strong, no signs of infection.  He does have some swelling at the surgical site, this is not significantly tense, question postoperative seroma; no concern for airway compromise.  We will continue to monitor this if it gets any worse he will follow-up immediately.  Otherwise he will follow-up with Dr. Tobie in 2 weeks.  Pathology pending at this time.  Patient and his wife verbalized understanding and agreement to today's plan had no further questions or concerns.   - f/u 2 weeks  with Dr. Tobie    Thank you for allowing me the opportunity to care for your patient. Please do not hesitate to contact me should you have any other questions.  Sincerely, Chyrl Cohen PA-C [facility] Phone: [phone] Fax: [phone]  04/27/2024, 9:44 AM

## 2024-04-28 ENCOUNTER — Telehealth (INDEPENDENT_AMBULATORY_CARE_PROVIDER_SITE_OTHER): Payer: Self-pay

## 2024-04-28 ENCOUNTER — Encounter (INDEPENDENT_AMBULATORY_CARE_PROVIDER_SITE_OTHER): Payer: Self-pay

## 2024-04-28 LAB — SURGICAL PATHOLOGY

## 2024-04-28 NOTE — Telephone Encounter (Signed)
 Patient left a message stating he has some question, he did not state what they were regarding

## 2024-04-29 ENCOUNTER — Inpatient Hospital Stay: Admitting: Oncology

## 2024-04-29 ENCOUNTER — Telehealth (INDEPENDENT_AMBULATORY_CARE_PROVIDER_SITE_OTHER): Payer: Self-pay | Admitting: Physician Assistant

## 2024-04-29 ENCOUNTER — Encounter (INDEPENDENT_AMBULATORY_CARE_PROVIDER_SITE_OTHER): Admitting: Physician Assistant

## 2024-04-29 ENCOUNTER — Inpatient Hospital Stay

## 2024-04-29 NOTE — Telephone Encounter (Signed)
 Thanks I let Dr. Tobie know the results were available, he will be reaching out to him to discuss them.

## 2024-04-29 NOTE — Telephone Encounter (Signed)
 Patient has some questions / concerns regarding recent surgery/imaging/pathology done and would like a call back.  Also was wondering of status of FMLA paperwork.  [phone]

## 2024-04-29 NOTE — Telephone Encounter (Signed)
 Thanks I let Dr. Tobie know that the results are available, he will be reaching out to him to discuss them.

## 2024-04-30 ENCOUNTER — Telehealth (INDEPENDENT_AMBULATORY_CARE_PROVIDER_SITE_OTHER): Payer: Self-pay | Admitting: Physician Assistant

## 2024-04-30 NOTE — Telephone Encounter (Signed)
 Called patient to let him know that his FMLA Paperwork has been completed and has been faxed per his request.  Patient also requested a copy of the completed forms.  A copy of the forms along with his receipt for payment of the FMLA Paperwork Fee is in an envelope in the Audiology office by Check In for the patient to pick up at his next appt on 05/12/2024.  Patient's wife stated that her employer is supposed to be faxing paperwork for Intermittent FMLA for her for 04/22/2024 through 05/02/2024.  I explained that there would be a $25 fee for the provider to complete her paperwork.  She expressed understanding.

## 2024-05-01 ENCOUNTER — Telehealth (INDEPENDENT_AMBULATORY_CARE_PROVIDER_SITE_OTHER): Payer: Self-pay | Admitting: Otolaryngology

## 2024-05-01 ENCOUNTER — Encounter: Payer: Self-pay | Admitting: Family Medicine

## 2024-05-01 DIAGNOSIS — C73 Malignant neoplasm of thyroid gland: Secondary | ICD-10-CM

## 2024-05-01 NOTE — Telephone Encounter (Signed)
 ENT Note: Discussed with patient re: diagnosis and reviewed pathology and literature on IEFV-PTC. D/w patient re: R/B/A and will proceed with completion thyroidectomy, possible central neck LN dissection if gross disease found. No clinically palpable LN. Will get pre-op CT. Case posted. Will need overnight observation.  Thomas [name] B Xyon Lukasik

## 2024-05-01 NOTE — Addendum Note (Signed)
 Addendum  created 05/01/24 0804 by Erma Thom SAUNDERS, MD   Intraprocedure Staff edited

## 2024-05-04 ENCOUNTER — Encounter: Payer: Self-pay | Admitting: Family Medicine

## 2024-05-04 ENCOUNTER — Encounter (INDEPENDENT_AMBULATORY_CARE_PROVIDER_SITE_OTHER): Payer: Self-pay

## 2024-05-04 ENCOUNTER — Ambulatory Visit: Admitting: Family Medicine

## 2024-05-04 VITALS — BP 124/87 | HR 109 | Ht 77.0 in | Wt 315.0 lb

## 2024-05-04 DIAGNOSIS — C73 Malignant neoplasm of thyroid gland: Secondary | ICD-10-CM

## 2024-05-04 DIAGNOSIS — E785 Hyperlipidemia, unspecified: Secondary | ICD-10-CM

## 2024-05-04 DIAGNOSIS — I152 Hypertension secondary to endocrine disorders: Secondary | ICD-10-CM

## 2024-05-04 DIAGNOSIS — E1159 Type 2 diabetes mellitus with other circulatory complications: Secondary | ICD-10-CM

## 2024-05-04 DIAGNOSIS — Z0000 Encounter for general adult medical examination without abnormal findings: Secondary | ICD-10-CM

## 2024-05-04 DIAGNOSIS — E1169 Type 2 diabetes mellitus with other specified complication: Secondary | ICD-10-CM | POA: Diagnosis not present

## 2024-05-04 DIAGNOSIS — R7309 Other abnormal glucose: Secondary | ICD-10-CM | POA: Diagnosis not present

## 2024-05-04 LAB — LIPID PANEL

## 2024-05-04 LAB — BAYER DCA HB A1C WAIVED: HB A1C (BAYER DCA - WAIVED): 6.7 % — ABNORMAL HIGH (ref 4.8–5.6)

## 2024-05-04 NOTE — Progress Notes (Signed)
 BP 124/87   Pulse (!) 109   Ht 6' 5 (1.956 m)   Wt (!) 315 lb (142.9 kg)   SpO2 95%   BMI 37.35 kg/m    Subjective:   Patient ID: Thomas [name], male    DOB: [birth year], 43 y.o.   MRN: [id number]  HPI: Thomas [name] is Thomas 43 y.o. male presenting on 05/04/2024 for Medical Management of Chronic Issues and Diabetes   Discussed the use of AI scribe software for clinical note transcription with the patient, who gave verbal consent to proceed. Physical exam Patient denies any chest pain, shortness of breath, headaches or vision issues, abdominal complaints, diarrhea, nausea, vomiting, or joint issues.    History of Present Illness   Thomas [name] is Thomas 43 year old male with thyroid  cancer who presents for follow-up after partial thyroidectomy.  He recently underwent Thomas partial thyroidectomy for Thomas cancerous lump on the right side of his thyroid , which was discovered incidentally. The lump was not quite baseball-sized. He is scheduled for Thomas CT scan to check for metastasis and has been informed that the remaining thyroid  will be removed in Thomas subsequent surgery.  He is concerned about the potential need for lifelong thyroid  hormone replacement following the complete removal of his thyroid . He inquires about the monitoring of calcium  levels post-surgery due to the proximity of the parathyroid glands, which regulate calcium  and vitamin D levels. He is aware that if at least one parathyroid gland remains intact, calcium  regulation should remain stable.  He is managing diabetes with Jardiance , metformin , and other medications. His recent HbA1c is 6.7%, which he is pleased with, although he notes Thomas temporary increase in blood glucose levels due to recent stress.  He is also taking medications for hypertension and hyperlipidemia, including losartan , rosuvastatin , and fenofibrate . He reports stable blood pressure readings around 120/80 mmHg and continues to take his cholesterol medications without  issues.  He mentions Thomas family history of thyroid  cancer, as his mother had Thomas similar condition and underwent radioactive iodine treatment successfully. He expresses concern about potential voice changes post-surgery, as his mother experienced hoarseness due to nerve involvement during her treatment.          Relevant past medical, surgical, family and social history reviewed and updated as indicated. Interim medical history since our last visit reviewed. Allergies and medications reviewed and updated.  Review of Systems  Constitutional:  Negative for chills and fever.  Eyes:  Negative for visual disturbance.  Respiratory:  Negative for shortness of breath and wheezing.   Cardiovascular:  Negative for chest pain and leg swelling.  Musculoskeletal:  Negative for back pain and gait problem.  Skin:  Positive for wound (Neck incision appears to be healing well). Negative for rash.  All other systems reviewed and are negative.   Per HPI unless specifically indicated above   Allergies as of 05/04/2024       Reactions   Penicillins Hives, Rash, Other (See Comments)   As Thomas child   Amoxicillin Hives, Rash        Medication List        Accurate as of May 04, 2024  4:10 PM. If you have any questions, ask your nurse or doctor.          STOP taking these medications    ibuprofen  200 MG tablet Commonly known as: Motrin  IB Stopped by: Thomas [name]   Rybelsus  7 MG Tabs Generic drug: Semaglutide  Stopped by: Thomas  Thomas [name]   sucralfate  1 g tablet Commonly known as: CARAFATE  Stopped by: Thomas [name]       TAKE these medications    albuterol  108 (90 Base) MCG/ACT inhaler Commonly known as: VENTOLIN  HFA Inhale 2 puffs into the lungs every 6 (six) hours as needed for wheezing or shortness of breath.   cholecalciferol 25 MCG (1000 UNIT) tablet Commonly known as: VITAMIN D3 Take 4,000 Units by mouth daily.   Clomid 50 MG tablet Generic drug:  clomiPHENE Take 25 mg by mouth daily.   empagliflozin  25 MG Tabs tablet Commonly known as: Jardiance  Take 1 tablet (25 mg total) by mouth daily before breakfast.   fenofibrate  145 MG tablet Commonly known as: TRICOR  Take 1 tablet (145 mg total) by mouth daily.   FreeStyle Libre 3 Sensor Misc PLACE 1 SENSOR ONTO THE SKIN EVERY 14 DAYS TO CHECK GLUCOSE   losartan  50 MG tablet Commonly known as: COZAAR  Take 1 tablet (50 mg total) by mouth daily.   metFORMIN  1000 MG tablet Commonly known as: GLUCOPHAGE  Take 1 tablet (1,000 mg total) by mouth 2 (two) times daily with Thomas meal. What changed: when to take this   multivitamin with minerals Tabs tablet Take 1 tablet by mouth daily.   omeprazole  20 MG capsule Commonly known as: PRILOSEC Take 1 capsule (20 mg total) by mouth daily.   ondansetron  4 MG disintegrating tablet Commonly known as: Zofran  ODT Take 1 tablet (4 mg total) by mouth every 8 (eight) hours as needed for nausea or vomiting.   ondansetron  4 MG disintegrating tablet Commonly known as: ZOFRAN -ODT Take 1 tablet (4 mg total) by mouth every 8 (eight) hours as needed for nausea or vomiting.   rosuvastatin  10 MG tablet Commonly known as: CRESTOR  TAKE 1 TABLET BY MOUTH EVERY DAY         Objective:   BP 124/87   Pulse (!) 109   Ht 6' 5 (1.956 m)   Wt (!) 315 lb (142.9 kg)   SpO2 95%   BMI 37.35 kg/m   Wt Readings from Last 3 Encounters:  05/04/24 (!) 315 lb (142.9 kg)  04/22/24 (!) 310 lb (140.6 kg)  03/31/24 (!) 310 lb (140.6 kg)    Physical Exam Physical Exam   VITALS: BP- 124/87 HEENT: Ears normal. CHEST: Lungs clear to auscultation bilaterally. CARDIOVASCULAR: Heart normal. ABDOMEN: No abdominal masses or inguinal hernia. EXTREMITIES: No edema in lower extremities. Distal pulses intact.       Results for orders placed or performed during the hospital encounter of 04/22/24  Glucose, capillary   Collection Time: 04/22/24 10:00 AM  Result Value  Ref Range   Glucose-Capillary 130 (H) 70 - 99 mg/dL  Glucose, capillary   Collection Time: 04/22/24 11:58 AM  Result Value Ref Range   Glucose-Capillary 99 70 - 99 mg/dL  Surgical pathology   Collection Time: 04/22/24  4:50 PM  Result Value Ref Range   SURGICAL PATHOLOGY      SURGICAL PATHOLOGY CASE: [id number] PATIENT: Thomas [name] Surgical Pathology Report     Clinical History: thyroid  nodule (cm)     FINAL MICROSCOPIC DIAGNOSIS:  Thomas. 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 - See cancer summary below. - Changes consistent with prior FNA.  CASE SUMMARY: THYROID  GLAND Standard(s): AJCC-UICC 8  SPECIMEN Procedure: Right lobectomy  TUMOR Tumor Focality: Unifocal Tumor Characteristics:      Tumor Site: Right lobe  Tumor Size: 3.8 cm in greatest dimension      Histologic Tumor Type and Subtype: Invasive encapsulated follicular variant of papillary thyroid  carcinoma; Widely invasive oncocytic follicular variant papillary carcinoma      Tumor Proliferative Activity: Less than 3 mitoses per 2 mm      Tumor Necrosis: Not identified      Angioinvasion (Vascular Invasion): Present, greater than 4 vessels      Lymphatic Invasion: P resent      Extrathyroidal Extension: Margins involved by the tumor, no macroscopic/microscopic extrathyroidal extension is noted in the sections examined      Margin Status: Carcinoma present at margin           Margin involved by carcinoma: Anterior  REGIONAL LYMPH NODES Regional Lymph Node Status: Not applicable (no regional lymph nodes submitted or found)  DISTANT METASTASIS Distant Site(s) Involved, if applicable: Not applicable  pTNM CLASSIFICATION (AJCC 8th Edition): Modified Classification: Not applicable pT Category: pT2 at least      T Suffix: Not applicable pN Category: pN - Not assigned (no nodes submitted or found) pM -  Not applicable  ADDITIONAL FINDINGS Additional Findings: Changes consistent with prior FNA  (v4.4.0.0)  Comment: Dr. Tobie was notified on 04/28/2024. This case underwent intradepartmental consultation and Dr. Reed concurs with the interpretation.  GROSS DESCRIPTION:  Specimen: Received fresh is right thyroid  lobe S pecimen integrity: Received disrupted anteriorly, the disruption measures 1.0 cm. Size and weight: 5.0 x 5.0 x 4.1 cm, 30 g External surface: Tan and shaggy, inked as follows: Anterior right lobe = red, anterior isthmus = yellow, posterior = black, disruption = blue Lesion: There is Thomas tan, well-defined lesion measuring 3.8 x 3.6 x 3.0 cm abutting both the anterior and posterior surfaces within the mid to inferior lobe.  The lesion is 2.5 cm from the isthmus margin.  The lesion is disrupted at the blue ink. Uninvolved parenchyma: The remaining parenchyma is red and spongy without further distinct lesions. Block Summary: A1-A20 = lesion, entirely A21 = isthmus margin A22 = uninvolved superior lobe MARYSUE, 04/23/2024)  Final Diagnosis performed by Rexene Daily, MD.   Electronically signed 04/28/2024 Technical component performed at [facility]. [facility], [address] [place], [place] [address].  Professional component performed at [facility]. [address] [place], [place] [address]  Immunohistochemistry Technical component (if applicable) was performed at [facility] [employer]. [address], [address], [place], [place] [address].  IMMUNOHISTOCHEMISTRY DISCLAIMER (if applicable): Some of these immunohistochemical stains may have been developed and the performance characteristics determine by [facility]. Some may not have been cleared or approved by the U.S. Food and Drug Administration. The FDA has determined that such clearance or approval is not necessary. This test is used for clinical  purposes. It should not be regarded as investigational or for research. This laboratory is certified under the Clinical Laboratory Improvement Amendments of 1988 (CLIA-88) as qualified to perform high complexity clinical laboratory testing.  The controls stained appropriately.   Glucose, capillary   Collection Time: 04/22/24  5:58 PM  Result Value Ref Range   Glucose-Capillary 181 (H) 70 - 99 mg/dL  Glucose, capillary   Collection Time: 04/22/24  6:55 PM  Result Value Ref Range   Glucose-Capillary 174 (H) 70 - 99 mg/dL    Assessment & Plan:   Problem List Items Addressed This Visit       Cardiovascular and Mediastinum   Hypertension associated with diabetes (HCC)  Endocrine   Type 2 diabetes mellitus with other specified complication (HCC) - Primary   Relevant Orders   Bayer DCA Hb A1c Waived   CBC with Differential/Platelet   CMP14+EGFR   Lipid panel   Hyperlipidemia associated with type 2 diabetes mellitus (HCC)   Thyroid  cancer (HCC)     Assessment and Plan    Thyroid  cancer, status post right thyroid  lobectomy, planned completion thyroidectomy Thyroid  cancer confirmed. Planned completion thyroidectomy to remove remaining thyroid  tissue. Potential for radioactive iodine treatment if necessary. Explained high curability due to targeted treatment. - Perform CT scan to assess for metastasis. - Proceed with completion thyroidectomy. - Refer to endocrinologist for post-surgical management. - Monitor calcium  levels post-surgery. - Initiate thyroid  hormone replacement post-surgery. - Consider radioactive iodine treatment if indicated.  Type 2 diabetes mellitus, well controlled Type 2 diabetes well controlled. Recent HbA1c is 6.7%. - Continue current diabetes management. - Stop Jardiance  three days prior to surgery. - Monitor blood glucose levels.  Hypertension Hypertension well controlled with current medication. Blood pressure 124/87 mmHg. - Continue current  antihypertensive regimen.  Mixed hyperlipidemia Managed with rosuvastatin  and fenofibrate . No issues reported. - Continue rosuvastatin . - Continue fenofibrate .          Follow up plan: Return in about 3 months (around 08/04/2024), or if symptoms worsen or fail to improve, for Diabetes recheck.  Counseling provided for all of the vaccine components Orders Placed This Encounter  Procedures   Bayer DCA Hb A1c Waived   CBC with Differential/Platelet   CMP14+EGFR   Lipid panel    Thomas Levins, MD [facility] 05/04/2024, 4:10 PM

## 2024-05-05 LAB — CMP14+EGFR
ALT: 21 IU/L (ref 0–44)
AST: 19 IU/L (ref 0–40)
Albumin: 4.3 g/dL (ref 4.1–5.1)
Alkaline Phosphatase: 59 IU/L (ref 44–121)
BUN/Creatinine Ratio: 16 (ref 9–20)
BUN: 20 mg/dL (ref 6–24)
Bilirubin Total: 0.3 mg/dL (ref 0.0–1.2)
CO2: 22 mmol/L (ref 20–29)
Calcium: 10.1 mg/dL (ref 8.7–10.2)
Chloride: 103 mmol/L (ref 96–106)
Creatinine, Ser: 1.25 mg/dL (ref 0.76–1.27)
Globulin, Total: 2.6 g/dL (ref 1.5–4.5)
Glucose: 113 mg/dL — AB (ref 70–99)
Potassium: 4 mmol/L (ref 3.5–5.2)
Sodium: 142 mmol/L (ref 134–144)
Total Protein: 6.9 g/dL (ref 6.0–8.5)
eGFR: 74 mL/min/1.73 (ref >59)

## 2024-05-05 LAB — CBC WITH DIFFERENTIAL/PLATELET
Basophils Absolute: 0.1 x10E3/uL (ref 0.0–0.2)
Basos: 1 %
EOS (ABSOLUTE): 0.4 x10E3/uL (ref 0.0–0.4)
Eos: 4 %
Hematocrit: 48.8 % (ref 37.5–51.0)
Hemoglobin: 15.6 g/dL (ref 13.0–17.7)
Immature Grans (Abs): 0 x10E3/uL (ref 0.0–0.1)
Immature Granulocytes: 0 %
Lymphocytes Absolute: 2.5 x10E3/uL (ref 0.7–3.1)
Lymphs: 31 %
MCH: 25.5 pg — ABNORMAL LOW (ref 26.6–33.0)
MCHC: 32 g/dL (ref 31.5–35.7)
MCV: 80 fL (ref 79–97)
Monocytes Absolute: 0.6 x10E3/uL (ref 0.1–0.9)
Monocytes: 8 %
Neutrophils Absolute: 4.4 x10E3/uL (ref 1.4–7.0)
Neutrophils: 56 %
Platelets: 316 x10E3/uL (ref 150–450)
RBC: 6.12 x10E6/uL — ABNORMAL HIGH (ref 4.14–5.80)
RDW: 14.5 % (ref 11.6–15.4)
WBC: 8 x10E3/uL (ref 3.4–10.8)

## 2024-05-05 LAB — LIPID PANEL
Cholesterol, Total: 138 mg/dL (ref 100–199)
HDL: 30 mg/dL — AB (ref >39)
LDL CALC COMMENT:: 4.6 ratio (ref 0.0–5.0)
LDL Chol Calc (NIH): 77 mg/dL (ref 0–99)
Triglycerides: 179 mg/dL — AB (ref 0–149)
VLDL Cholesterol Cal: 31 mg/dL (ref 5–40)

## 2024-05-07 ENCOUNTER — Ambulatory Visit (HOSPITAL_COMMUNITY)
Admission: RE | Admit: 2024-05-07 | Discharge: 2024-05-07 | Disposition: A | Discharge status: Home / Self Care | Source: Ambulatory Visit | Attending: Otolaryngology | Admitting: Otolaryngology

## 2024-05-07 ENCOUNTER — Inpatient Hospital Stay: Admitting: Oncology

## 2024-05-07 ENCOUNTER — Inpatient Hospital Stay

## 2024-05-07 DIAGNOSIS — C73 Malignant neoplasm of thyroid gland: Secondary | ICD-10-CM | POA: Insufficient documentation

## 2024-05-07 MED ORDER — IOHEXOL 300 MG/ML  SOLN
75.0000 mL | Freq: Once | INTRAMUSCULAR | Status: AC | PRN
  Administered 2024-05-07: 75 mL via INTRAVENOUS

## 2024-05-08 ENCOUNTER — Ambulatory Visit: Payer: Self-pay | Admitting: Family Medicine

## 2024-05-08 ENCOUNTER — Encounter (INDEPENDENT_AMBULATORY_CARE_PROVIDER_SITE_OTHER): Payer: Self-pay

## 2024-05-11 ENCOUNTER — Encounter (HOSPITAL_COMMUNITY): Payer: Self-pay

## 2024-05-11 ENCOUNTER — Other Ambulatory Visit: Payer: Self-pay

## 2024-05-11 NOTE — Progress Notes (Addendum)
 PCP - Dettinger, Fonda LABOR, MD  Cardiologist -    PPM/ICD - denies Device Orders - n/a Rep Notified - n/a  Chest x-ray - denies EKG - 04-22-24 Stress Test - denies ECHO - denies Cardiac Cath - denies  CPAP - denies  GLP-1 -denies  Fasting Blood Sugar - per patient blood sugar ranges around 120 Checks Blood Sugar FREESTYLE LIBRE 3 SENSOR Right side of arm empagliflozin  (JARDIANCE ) LAST DOSE 05-10-24 metFORMIN  (GLUCOPHAGE ) Last DOSE 05-12-24  Blood Thinner Instructions: denies Aspirin Instructions: n/a  ERAS Protcol - clear liquids until 9:30 am  COVID TEST- n/a  Anesthesia review: Yes, HTN, Dm  Patient verbally denies any shortness of breath, fever, cough and chest pain during phone call   -------------  SDW INSTRUCTIONS given:  Your procedure is scheduled on May 13, 2024.  Report to [facility] Main Entrance A at 10:00 A.M., and check in at the Admitting office.  Call this number if you have problems the morning of surgery:  [phone]   Remember:  Do not eat after midnight the night before your surgery  You may drink clear liquids until 9:30 the morning of your surgery.   Clear liquids allowed are: Water, Non-Citrus Juices (without pulp), Carbonated Beverages, Clear Tea, Black Coffee Only, and Gatorade    Take these medicines the morning of surgery with A SIP OF WATER  albuterol  (VENTOLIN  HFA) inhaler  fenofibrate  (TRICOR )  omeprazole  (PRILOSEC)  ondansetron  (ZOFRAN -ODT)  rosuvastatin  (CRESTOR )   As of today, STOP taking any Aspirin (unless otherwise instructed by your surgeon) Aleve, Naproxen, Ibuprofen , Motrin , Advil , Goody's, BC's, all herbal medications, fish oil, and all vitamins.       WHAT DO I DO ABOUT MY DIABETES MEDICATION?   Do not take oral diabetes medicinesmetFORMIN (GLUCOPHAGE )  (pills) the morning of surgery.  empagliflozin  (JARDIANCE ) LAST DOSE 05-10-24 per patient   The day of surgery, do not take other diabetes injectables,  including Byetta (exenatide), Bydureon (exenatide ER), Victoza (liraglutide), or Trulicity  (dulaglutide ).  If your CBG is greater than 220 mg/dL, you may take  of your sliding scale (correction) dose of insulin .   HOW TO MANAGE YOUR DIABETES BEFORE AND AFTER SURGERY  Why is it important to control my blood sugar before and after surgery? Improving blood sugar levels before and after surgery helps healing and can limit problems. A way of improving blood sugar control is eating a healthy diet by:  Eating less sugar and carbohydrates  Increasing activity/exercise  Talking with your doctor about reaching your blood sugar goals High blood sugars (greater than 180 mg/dL) can raise your risk of infections and slow your recovery, so you will need to focus on controlling your diabetes during the weeks before surgery. Make sure that the doctor who takes care of your diabetes knows about your planned surgery including the date and location.  How do I manage my blood sugar before surgery? Check your blood sugar at least 4 times a day, starting 2 days before surgery, to make sure that the level is not too high or low.  Check your blood sugar the morning of your surgery when you wake up and every 2 hours until you get to the Short Stay unit.  If your blood sugar is less than 70 mg/dL, you will need to treat for low blood sugar: Do not take insulin . Treat a low blood sugar (less than 70 mg/dL) with  cup of clear juice (cranberry or apple), 4 glucose tablets, OR glucose gel. Recheck  blood sugar in 15 minutes after treatment (to make sure it is greater than 70 mg/dL). If your blood sugar is not greater than 70 mg/dL on recheck, call [phone] for further instructions. Report your blood sugar to the short stay nurse when you get to Short Stay.  If you are admitted to the hospital after surgery: Your blood sugar will be checked by the staff and you will probably be given insulin  after surgery (instead  of oral diabetes medicines) to make sure you have good blood sugar levels. The goal for blood sugar control after surgery is 80-180 mg/dL.                Do not wear jewelry, make up, or nail polish            Do not wear lotions, powders, perfumes/colognes, or deodorant.            Do not shave 48 hours prior to surgery.  Men may shave face and neck.            Do not bring valuables to the hospital.            [facility] is not responsible for any belongings or valuables.  Do NOT Smoke (Tobacco/Vaping) 24 hours prior to your procedure If you use a CPAP at night, you may bring all equipment for your overnight stay.   Contacts, glasses, dentures or bridgework may not be worn into surgery.      For patients admitted to the hospital, discharge time will be determined by your treatment team.   Patients discharged the day of surgery will not be allowed to drive home, and someone needs to stay with them for 24 hours.    Special instructions:   [place]- Preparing For Surgery  Before surgery, you can play an important role. Because skin is not sterile, your skin needs to be as free of germs as possible. You can reduce the number of germs on your skin by washing with CHG (chlorahexidine gluconate) Soap before surgery.  CHG is an antiseptic cleaner which kills germs and bonds with the skin to continue killing germs even after washing.    Oral Hygiene is also important to reduce your risk of infection.  Remember - BRUSH YOUR TEETH THE MORNING OF SURGERY WITH YOUR REGULAR TOOTHPASTE  Please do not use if you have an allergy to CHG or antibacterial soaps. If your skin becomes reddened/irritated stop using the CHG.  Do not shave (including legs and underarms) for at least 48 hours prior to first CHG shower. It is OK to shave your face.  Please follow these instructions carefully.   Shower the NIGHT BEFORE SURGERY and the MORNING OF SURGERY with DIAL Soap.   Pat yourself dry with a CLEAN  TOWEL.  Wear CLEAN PAJAMAS to bed the night before surgery  Place CLEAN SHEETS on your bed the night of your first shower and DO NOT SLEEP WITH PETS.   Day of Surgery: Please shower morning of surgery  Wear Clean/Comfortable clothing the morning of surgery Do not apply any deodorants/lotions.   Remember to brush your teeth WITH YOUR REGULAR TOOTHPASTE.   Questions were answered. Patient verbalized understanding of instructions.

## 2024-05-12 ENCOUNTER — Ambulatory Visit (INDEPENDENT_AMBULATORY_CARE_PROVIDER_SITE_OTHER): Admitting: Otolaryngology

## 2024-05-12 ENCOUNTER — Encounter (INDEPENDENT_AMBULATORY_CARE_PROVIDER_SITE_OTHER): Payer: Self-pay | Admitting: Otolaryngology

## 2024-05-12 VITALS — BP 127/91 | HR 97 | Ht 77.0 in

## 2024-05-12 DIAGNOSIS — C73 Malignant neoplasm of thyroid gland: Secondary | ICD-10-CM

## 2024-05-12 DIAGNOSIS — E041 Nontoxic single thyroid nodule: Secondary | ICD-10-CM

## 2024-05-12 NOTE — Progress Notes (Signed)
 Anesthesia Chart Review: CANDELARIA MICHAE POLITE  Case: [id number] Date/Time: 05/13/24 1215   Procedure: THYROIDECTOMY, COMPLETION (Left)   Anesthesia type: General   Diagnosis: Thyroid  cancer (HCC) [C73]   Pre-op diagnosis: Thyroid  cancer   [facility] ROOM 08 / [facility]   Surgeons: Tobie Eldora NOVAK, MD       DISCUSSION: Patient is a 43 year old male scheduled for the above procedure.  See my previous note from date of service 04/21/2024.  He is s/p right thyroid  lobectomy on 04/22/2024. 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 Recommendation for completion thyroidectomy, possible central neck LN dissection if gross disease found.   History includes former smoker (quit 10/02/2023), dyspnea, HTN, HLD, DM2, GERD, right thyroid  carcinoma, nephrolithiasis.   Preoperative CT Soft Tissue Neck from 05/07/2024 showed: IMPRESSION: 1. Interval postoperative changes of right thyroidectomy with likely postoperative seroma. No enhancement to suggest abscess. 2. Medial positioning of the right aryepiglottic fold and right vocal folds with asymmetric prominence of the right piriform sinus, concerning for vocal cord paralysis. Recommend correlation with direct visualization. 3. Mildly prominent bilateral level 2 cervical nodes with retained fatty hila, measuring up to 0.9 cm on the right and 0.6 cm on the left, likely reactive. No enhancing or calcified cervical lymph nodes. 4. No mass or abnormal enhancement along the visualized aerodigestive structures in the neck.  Anesthesia team to evaluate on the day of surgery. He had Lipid panel, CMP14+EGFR, CBC with diff, A1c on 05/04/2024 through PCP.    VS: Ht 6' 5 (1.956 m)   Wt (!) 142.9 kg   BMI 37.36 kg/m   BP Readings from Last 3 Encounters:  05/04/24 124/87  04/27/24 122/83  04/23/24 132/82   Pulse Readings from Last 3 Encounters:  05/04/24 (!) 109  04/27/24 92  04/23/24 88      PROVIDERS: Dettinger, Fonda LABOR, MD is PCP    LABS: Most recent lab results in [facility] include: Lab Results  Component Value Date   WBC 8.0 05/04/2024   HGB 15.6 05/04/2024   HCT 48.8 05/04/2024   PLT 316 05/04/2024   GLUCOSE 113 (H) 05/04/2024   CHOL 138 05/04/2024   TRIG 179 (H) 05/04/2024   HDL 30 (L) 05/04/2024   LDLCALC 77 05/04/2024   ALT 21 05/04/2024   AST 19 05/04/2024   NA 142 05/04/2024   K 4.0 05/04/2024   CL 103 05/04/2024   CREATININE 1.25 05/04/2024   BUN 20 05/04/2024   CO2 22 05/04/2024   TSH 2.250 03/04/2024   HGBA1C 6.7 (H) 05/04/2024    EKG: 04/22/24: NSR   CV: N/A   Past Medical History:  Diagnosis Date   Cancer (HCC)    Diabetes mellitus without complication (HCC)    type 2   Dyspnea    albuterol  inhaler prn   GERD (gastroesophageal reflux disease)    omeprazole    History of kidney stones 03/2015   in [place] - passed stone   HLD (hyperlipidemia)    crestor    Hypertension    losartan    Plantar fasciitis    hx - wears insoles in shoes   Pneumonia    x several   Thyroid  nodule 03/2024   right    Past Surgical History:  Procedure Laterality Date   broken arm Left    as a child   THYROID  LOBECTOMY Right 04/22/2024   Procedure: LOBECTOMY, THYROID ;  Surgeon: Tobie Eldora NOVAK, MD;  Location: [facility] OR;  Service:  ENT;  Laterality: Right;    MEDICATIONS: No current facility-administered medications for this encounter.    cholecalciferol (VITAMIN D3) 25 MCG (1000 UNIT) tablet   CLOMID 50 MG tablet   empagliflozin  (JARDIANCE ) 25 MG TABS tablet   fenofibrate  (TRICOR ) 145 MG tablet   losartan  (COZAAR ) 50 MG tablet   metFORMIN  (GLUCOPHAGE ) 1000 MG tablet   Multiple Vitamin (MULTIVITAMIN WITH MINERALS) TABS tablet   omeprazole  (PRILOSEC) 20 MG capsule   ondansetron  (ZOFRAN -ODT) 4 MG disintegrating tablet   rosuvastatin  (CRESTOR ) 10 MG tablet   albuterol  (VENTOLIN  HFA) 108 (90 Base) MCG/ACT inhaler   Continuous Glucose Sensor (FREESTYLE  LIBRE 3 SENSOR) MISC    Isaiah Ruder, PA-C Surgical Short Stay/Anesthesiology [facility] Phone [phone] [facility] Phone [phone] 05/12/2024 12:55 PM

## 2024-05-12 NOTE — Anesthesia Preprocedure Evaluation (Addendum)
 Anesthesia Evaluation  Patient identified by MRN, date of birth, ID band Patient awake    Reviewed: Allergy & Precautions, NPO status , Patient's Chart, lab work & pertinent test results, reviewed documented beta blocker date and time   History of Anesthesia Complications Negative for: history of anesthetic complications  Airway Mallampati: III  TM Distance: >3 FB     Dental no notable dental hx.    Pulmonary shortness of breath and with exertion, pneumonia, former smoker   breath sounds clear to auscultation       Cardiovascular Exercise Tolerance: Good hypertension, (-) CAD, (-) Past MI and (-) Cardiac Stents  Rhythm:Regular Rate:Normal     Neuro/Psych neg Seizures    GI/Hepatic ,GERD  Medicated and Controlled,,(+) neg Cirrhosis        Endo/Other  diabetes, Type 2    Renal/GU Renal disease     Musculoskeletal   Abdominal   Peds  Hematology   Anesthesia Other Findings   Reproductive/Obstetrics                              Anesthesia Physical Anesthesia Plan  ASA: 3  Anesthesia Plan: General   Post-op Pain Management:    Induction: Intravenous  PONV Risk Score and Plan: 2 and Ondansetron  and Dexamethasone   Airway Management Planned: Oral ETT and Video Laryngoscope Planned  Additional Equipment:   Intra-op Plan:   Post-operative Plan: Extubation in OR  Informed Consent: I have reviewed the patients History and Physical, chart, labs and discussed the procedure including the risks, benefits and alternatives for the proposed anesthesia with the patient or authorized representative who has indicated his/her understanding and acceptance.     Dental advisory given  Plan Discussed with: CRNA  Anesthesia Plan Comments: (PAT note written 05/12/2024 by Allison Zelenak, PA-C.  )         Anesthesia Quick Evaluation

## 2024-05-12 NOTE — Progress Notes (Signed)
 ENT Pre-op Visit:  Returns for pre-op visit. Based on literature review and pathology, recommend completion thyroidectomy. He had several questions which we answered  Risks of thyroidectomy were discussed including pain, bleeding, infection, need for drain placement, injury to recurrent laryngeal nerve including vocal fold paralysis and hoarseness, lack of improvement, need for further procedures, change in swallowing, hypocalcemia, injury to trachea or surrounding great vessels including pulmonary complications, and discovery of malignancy.   CT reviewed - no obvious worrisome adenopathy; small seroma right thyroid  bed. Left thyroid  without any nodule.  Vitals:   05/12/24 1258  BP: (!) 127/91  Pulse: 97  SpO2: 96%   PE:  A&O x3 neck soft, slight fullness; incision well healed; voice strong, no breathiness or stridor  Procedure Note Pre-procedure diagnosis: Post-operative assessment of vocal folds after thyroid  surgery Post-procedure diagnosis: Same Procedure: Transnasal Fiberoptic Laryngoscopy, CPT 31575 - Mod 25 Indication: see above Complications: None apparent EBL: 0 mL  The procedure was undertaken to further evaluate the patient's complaint above, with mirror exam inadequate for appropriate examination due to gag reflex and poor patient tolerance  Procedure:  Patient was identified as correct patient. Verbal consent was obtained. The nose was sprayed with oxymetazoline and 4% lidocaine . The The flexible laryngoscope was passed through the nose to view the nasal cavity, pharynx (oropharynx, hypopharynx) and larynx.  The larynx was examined at rest and during multiple phonatory tasks. Documentation was obtained and reviewed with patient. The scope was removed. The patient tolerated the procedure well.  Findings: The nasal cavity and nasopharynx did not reveal any masses or lesions, mucosa appeared to be without obvious lesions. The tongue base, pharyngeal walls, piriform sinuses,  vallecula, epiglottis and postcricoid region are normal in appearance. The visualized portion of the subglottis and proximal trachea is widely patent. The vocal folds are mobile bilaterally - slight hypomobility but clearly mobile right vocal fold. There are no lesions on the free edge of the vocal folds nor elsewhere in the larynx worrisome for malignancy.     Patient understands these risks, and despite these risks, wishes to proceed. Will refer to Endocrinology  MDM: [id number]

## 2024-05-13 ENCOUNTER — Other Ambulatory Visit: Payer: Self-pay

## 2024-05-13 ENCOUNTER — Encounter (HOSPITAL_COMMUNITY): Payer: Self-pay

## 2024-05-13 ENCOUNTER — Encounter (HOSPITAL_COMMUNITY)
Admission: RE | Disposition: A | Discharge status: Home / Self Care | Payer: Self-pay | Source: Home / Self Care | Attending: Otolaryngology

## 2024-05-13 ENCOUNTER — Encounter (HOSPITAL_COMMUNITY): Payer: Self-pay | Admitting: Vascular Surgery

## 2024-05-13 ENCOUNTER — Ambulatory Visit (HOSPITAL_COMMUNITY): Payer: Self-pay | Admitting: Vascular Surgery

## 2024-05-13 ENCOUNTER — Ambulatory Visit (HOSPITAL_COMMUNITY)
Admission: RE | Admit: 2024-05-13 | Discharge: 2024-05-14 | Disposition: A | Discharge status: Home / Self Care | Attending: Otolaryngology | Admitting: Otolaryngology

## 2024-05-13 DIAGNOSIS — C73 Malignant neoplasm of thyroid gland: Secondary | ICD-10-CM | POA: Diagnosis present

## 2024-05-13 DIAGNOSIS — E785 Hyperlipidemia, unspecified: Secondary | ICD-10-CM | POA: Diagnosis not present

## 2024-05-13 DIAGNOSIS — I10 Essential (primary) hypertension: Secondary | ICD-10-CM | POA: Insufficient documentation

## 2024-05-13 DIAGNOSIS — E119 Type 2 diabetes mellitus without complications: Secondary | ICD-10-CM | POA: Insufficient documentation

## 2024-05-13 DIAGNOSIS — Z79899 Other long term (current) drug therapy: Secondary | ICD-10-CM | POA: Insufficient documentation

## 2024-05-13 DIAGNOSIS — K219 Gastro-esophageal reflux disease without esophagitis: Secondary | ICD-10-CM | POA: Insufficient documentation

## 2024-05-13 DIAGNOSIS — Z7984 Long term (current) use of oral hypoglycemic drugs: Secondary | ICD-10-CM | POA: Diagnosis not present

## 2024-05-13 DIAGNOSIS — Z87891 Personal history of nicotine dependence: Secondary | ICD-10-CM | POA: Diagnosis not present

## 2024-05-13 HISTORY — PX: THYROIDECTOMY, COMPLETION: SHX7647

## 2024-05-13 HISTORY — DX: Malignant (primary) neoplasm, unspecified: C80.1

## 2024-05-13 LAB — GLUCOSE, CAPILLARY
Glucose-Capillary: 149 mg/dL — ABNORMAL HIGH (ref 70–99)
Glucose-Capillary: 144 mg/dL — ABNORMAL HIGH (ref 70–99)
Glucose-Capillary: 184 mg/dL — ABNORMAL HIGH (ref 70–99)
Glucose-Capillary: 237 mg/dL — ABNORMAL HIGH (ref 70–99)

## 2024-05-13 SURGERY — THYROIDECTOMY, COMPLETION
Anesthesia: General | Site: Neck | Laterality: Left

## 2024-05-13 MED ORDER — ONDANSETRON HCL 4 MG/2ML IJ SOLN
INTRAMUSCULAR | Status: AC
  Filled 2024-05-13: qty 2

## 2024-05-13 MED ORDER — FENTANYL CITRATE (PF) 100 MCG/2ML IJ SOLN
INTRAMUSCULAR | Status: AC
  Filled 2024-05-13: qty 2

## 2024-05-13 MED ORDER — LIDOCAINE-EPINEPHRINE 1 %-1:100000 IJ SOLN
INTRAMUSCULAR | Status: DC | PRN
  Administered 2024-05-13 (×2): 6 mL

## 2024-05-13 MED ORDER — ENOXAPARIN SODIUM 40 MG/0.4ML IJ SOSY
40.0000 mg | PREFILLED_SYRINGE | INTRAMUSCULAR | Status: DC
  Administered 2024-05-14: 40 mg via SUBCUTANEOUS
  Filled 2024-05-13: qty 0.4

## 2024-05-13 MED ORDER — IBUPROFEN 400 MG PO TABS
400.0000 mg | ORAL_TABLET | Freq: Four times a day (QID) | ORAL | Status: DC
  Administered 2024-05-13 – 2024-05-14 (×5): 400 mg via ORAL
  Filled 2024-05-13 (×4): qty 1

## 2024-05-13 MED ORDER — LIDOCAINE 2% (20 MG/ML) 5 ML SYRINGE
INTRAMUSCULAR | Status: AC
  Filled 2024-05-13: qty 5

## 2024-05-13 MED ORDER — LOSARTAN POTASSIUM 50 MG PO TABS
50.0000 mg | ORAL_TABLET | Freq: Every day | ORAL | Status: DC
  Administered 2024-05-14: 50 mg via ORAL
  Filled 2024-05-13: qty 1

## 2024-05-13 MED ORDER — ACETAMINOPHEN 10 MG/ML IV SOLN: 1000.0000 mg | Freq: Once | INTRAVENOUS | Status: DC | PRN

## 2024-05-13 MED ORDER — PHENOL 1.4 % MT LIQD: 1.0000 | OROMUCOSAL | Status: DC | PRN

## 2024-05-13 MED ORDER — SUCCINYLCHOLINE CHLORIDE 200 MG/10ML IV SOSY
PREFILLED_SYRINGE | INTRAVENOUS | Status: DC | PRN
  Administered 2024-05-13 (×2): 160 mg via INTRAVENOUS

## 2024-05-13 MED ORDER — BACITRACIN ZINC 500 UNIT/GM EX OINT
TOPICAL_OINTMENT | CUTANEOUS | Status: DC | PRN
  Administered 2024-05-13 (×2): 1 via TOPICAL

## 2024-05-13 MED ORDER — OXYCODONE HCL 5 MG/5ML PO SOLN
ORAL | Status: AC
  Filled 2024-05-13: qty 5

## 2024-05-13 MED ORDER — INSULIN ASPART 100 UNIT/ML IJ SOLN
0.0000 [IU] | Freq: Three times a day (TID) | INTRAMUSCULAR | Status: DC
  Administered 2024-05-14 (×2): 3 [IU] via SUBCUTANEOUS
  Administered 2024-05-14: 5 [IU] via SUBCUTANEOUS

## 2024-05-13 MED ORDER — PROPOFOL 10 MG/ML IV BOLUS
INTRAVENOUS | Status: AC
  Filled 2024-05-13: qty 20

## 2024-05-13 MED ORDER — ONDANSETRON HCL 4 MG/2ML IJ SOLN
4.0000 mg | INTRAMUSCULAR | Status: DC | PRN
  Administered 2024-05-13 (×2): 4 mg via INTRAVENOUS
  Filled 2024-05-13: qty 2

## 2024-05-13 MED ORDER — FENTANYL CITRATE (PF) 250 MCG/5ML IJ SOLN
INTRAMUSCULAR | Status: DC | PRN
  Administered 2024-05-13 (×2): 50 ug via INTRAVENOUS
  Administered 2024-05-13: 150 ug via INTRAVENOUS
  Administered 2024-05-13: 50 ug via INTRAVENOUS
  Administered 2024-05-13: 150 ug via INTRAVENOUS
  Administered 2024-05-13: 50 ug via INTRAVENOUS

## 2024-05-13 MED ORDER — LACTATED RINGERS IV SOLN: INTRAVENOUS | Status: DC

## 2024-05-13 MED ORDER — MIDAZOLAM HCL 2 MG/2ML IJ SOLN
INTRAMUSCULAR | Status: AC
  Filled 2024-05-13: qty 2

## 2024-05-13 MED ORDER — ONDANSETRON HCL 4 MG PO TABS: 4.0000 mg | ORAL_TABLET | ORAL | Status: DC | PRN

## 2024-05-13 MED ORDER — DOCUSATE SODIUM 100 MG PO CAPS
100.0000 mg | ORAL_CAPSULE | Freq: Two times a day (BID) | ORAL | Status: DC
  Administered 2024-05-13 – 2024-05-14 (×3): 100 mg via ORAL
  Filled 2024-05-13 (×2): qty 1

## 2024-05-13 MED ORDER — OXYCODONE HCL 5 MG/5ML PO SOLN: 5.0000 mg | Freq: Once | ORAL | Status: DC | PRN

## 2024-05-13 MED ORDER — DEXTROSE 5 % IV SOLN
INTRAVENOUS | Status: DC | PRN
  Administered 2024-05-13 (×2): 3 g via INTRAVENOUS

## 2024-05-13 MED ORDER — CALCIUM CARBONATE ANTACID 500 MG PO CHEW
400.0000 mg | CHEWABLE_TABLET | Freq: Three times a day (TID) | ORAL | Status: DC
  Administered 2024-05-13 – 2024-05-14 (×4): 400 mg via ORAL
  Filled 2024-05-13 (×3): qty 2

## 2024-05-13 MED ORDER — DEXAMETHASONE SODIUM PHOSPHATE 10 MG/ML IJ SOLN
INTRAMUSCULAR | Status: DC | PRN
  Administered 2024-05-13 (×2): 10 mg via INTRAVENOUS

## 2024-05-13 MED ORDER — MIDAZOLAM HCL 2 MG/2ML IJ SOLN
INTRAMUSCULAR | Status: DC | PRN
  Administered 2024-05-13 (×2): 2 mg via INTRAVENOUS

## 2024-05-13 MED ORDER — BACITRACIN ZINC 500 UNIT/GM EX OINT
TOPICAL_OINTMENT | CUTANEOUS | Status: AC
  Filled 2024-05-13: qty 28.35

## 2024-05-13 MED ORDER — 0.9 % SODIUM CHLORIDE (POUR BTL) OPTIME
TOPICAL | Status: DC | PRN
Start: 2024-05-13 — End: 2024-05-13
  Administered 2024-05-13 (×2): 1000 mL

## 2024-05-13 MED ORDER — CALCITRIOL 0.5 MCG PO CAPS
0.5000 ug | ORAL_CAPSULE | Freq: Two times a day (BID) | ORAL | Status: DC
  Administered 2024-05-13 – 2024-05-14 (×3): 0.5 ug via ORAL
  Filled 2024-05-13 (×4): qty 1

## 2024-05-13 MED ORDER — EPHEDRINE SULFATE-NACL 50-0.9 MG/10ML-% IV SOSY
PREFILLED_SYRINGE | INTRAVENOUS | Status: DC | PRN
  Administered 2024-05-13 (×2): 10 mg via INTRAVENOUS

## 2024-05-13 MED ORDER — LEVOTHYROXINE SODIUM 100 MCG PO TABS
200.0000 ug | ORAL_TABLET | Freq: Every day | ORAL | Status: DC
  Administered 2024-05-14: 200 ug via ORAL
  Filled 2024-05-13: qty 2

## 2024-05-13 MED ORDER — OXYCODONE HCL 5 MG PO TABS: 5.0000 mg | ORAL_TABLET | Freq: Once | ORAL | Status: DC | PRN

## 2024-05-13 MED ORDER — PHENYLEPHRINE HCL-NACL 20-0.9 MG/250ML-% IV SOLN
INTRAVENOUS | Status: DC | PRN
  Administered 2024-05-13 (×2): 30 ug/min via INTRAVENOUS

## 2024-05-13 MED ORDER — FENTANYL CITRATE (PF) 100 MCG/2ML IJ SOLN
25.0000 ug | INTRAMUSCULAR | Status: DC | PRN
  Administered 2024-05-13 (×6): 50 ug via INTRAVENOUS

## 2024-05-13 MED ORDER — ONDANSETRON HCL 4 MG/2ML IJ SOLN
INTRAMUSCULAR | Status: DC | PRN
  Administered 2024-05-13 (×2): 4 mg via INTRAVENOUS

## 2024-05-13 MED ORDER — FENTANYL CITRATE (PF) 250 MCG/5ML IJ SOLN
INTRAMUSCULAR | Status: AC
  Filled 2024-05-13: qty 5

## 2024-05-13 MED ORDER — CHLORHEXIDINE GLUCONATE 0.12 % MT SOLN
15.0000 mL | Freq: Once | OROMUCOSAL | Status: AC
  Administered 2024-05-13 (×2): 15 mL via OROMUCOSAL
  Filled 2024-05-13: qty 15

## 2024-05-13 MED ORDER — BACITRACIN ZINC 500 UNIT/GM EX OINT
1.0000 | TOPICAL_OINTMENT | Freq: Two times a day (BID) | CUTANEOUS | Status: DC
  Administered 2024-05-13 – 2024-05-14 (×3): 1 via TOPICAL
  Filled 2024-05-13: qty 28.35

## 2024-05-13 MED ORDER — ACETAMINOPHEN 160 MG/5ML PO SOLN
650.0000 mg | ORAL | Status: DC
  Administered 2024-05-13 – 2024-05-14 (×7): 650 mg via ORAL
  Filled 2024-05-13 (×6): qty 20.3

## 2024-05-13 MED ORDER — DEXMEDETOMIDINE HCL IN NACL 80 MCG/20ML IV SOLN
INTRAVENOUS | Status: DC | PRN
  Administered 2024-05-13 (×2): 4 ug via INTRAVENOUS

## 2024-05-13 MED ORDER — LIDOCAINE 2% (20 MG/ML) 5 ML SYRINGE
INTRAMUSCULAR | Status: DC | PRN
  Administered 2024-05-13 (×2): 100 mg via INTRAVENOUS

## 2024-05-13 MED ORDER — SENNA 8.6 MG PO TABS
1.0000 | ORAL_TABLET | Freq: Two times a day (BID) | ORAL | Status: DC
  Administered 2024-05-13 – 2024-05-14 (×3): 8.6 mg via ORAL
  Filled 2024-05-13 (×2): qty 1

## 2024-05-13 MED ORDER — ORAL CARE MOUTH RINSE: 15.0000 mL | Freq: Once | OROMUCOSAL | Status: AC

## 2024-05-13 MED ORDER — PROPOFOL 10 MG/ML IV BOLUS
INTRAVENOUS | Status: DC | PRN
  Administered 2024-05-13 (×2): 75 ug/kg/min via INTRAVENOUS
  Administered 2024-05-13 (×2): 200 mg via INTRAVENOUS

## 2024-05-13 MED ORDER — INSULIN ASPART 100 UNIT/ML IJ SOLN
0.0000 [IU] | INTRAMUSCULAR | Status: AC | PRN
  Administered 2024-05-13 (×4): 2 [IU] via SUBCUTANEOUS
  Filled 2024-05-13 (×2): qty 1

## 2024-05-13 MED ORDER — OXYCODONE HCL 5 MG/5ML PO SOLN
5.0000 mg | ORAL | Status: DC | PRN
  Administered 2024-05-13 – 2024-05-14 (×5): 5 mg via ORAL
  Filled 2024-05-13 (×2): qty 5

## 2024-05-13 MED ORDER — DEXAMETHASONE SODIUM PHOSPHATE 10 MG/ML IJ SOLN
INTRAMUSCULAR | Status: AC
  Filled 2024-05-13: qty 1

## 2024-05-13 MED ORDER — ONDANSETRON HCL 4 MG/2ML IJ SOLN: 4.0000 mg | Freq: Once | INTRAMUSCULAR | Status: DC | PRN

## 2024-05-13 MED ORDER — CEFAZOLIN SODIUM 1 G IJ SOLR
INTRAMUSCULAR | Status: AC
  Filled 2024-05-13: qty 30

## 2024-05-13 MED ORDER — ACETAMINOPHEN 650 MG RE SUPP: 650.0000 mg | RECTAL | Status: DC

## 2024-05-13 MED ORDER — ACETAMINOPHEN 10 MG/ML IV SOLN
INTRAVENOUS | Status: DC | PRN
  Administered 2024-05-13 (×2): 1000 mg via INTRAVENOUS

## 2024-05-13 MED ORDER — LIDOCAINE-EPINEPHRINE 1 %-1:100000 IJ SOLN
INTRAMUSCULAR | Status: AC
  Filled 2024-05-13: qty 1

## 2024-05-13 SURGICAL SUPPLY — 41 items
CANISTER SUCTION 3000ML PPV (SUCTIONS) ×1 IMPLANT
CLIP TI MEDIUM 24 (CLIP) ×1 IMPLANT
CLIP TI WIDE RED SMALL 24 (CLIP) ×1 IMPLANT
CNTNR URN SCR LID CUP LEK RST (MISCELLANEOUS) IMPLANT
CORD BIPOLAR FORCEPS 12FT (ELECTRODE) ×1 IMPLANT
COVER SURGICAL LIGHT HANDLE (MISCELLANEOUS) ×1 IMPLANT
DERMABOND ADVANCED .7 DNX12 (GAUZE/BANDAGES/DRESSINGS) ×1 IMPLANT
DRAIN CHANNEL 19F RND (DRAIN) IMPLANT
DRAPE INCISE IOBAN 66X45 STRL (DRAPES) ×1 IMPLANT
ELECT COATED BLADE 2.86 ST (ELECTRODE) ×1 IMPLANT
ELECTRODE REM PT RTRN 9FT ADLT (ELECTROSURGICAL) ×1 IMPLANT
EVACUATOR SILICONE 100CC (DRAIN) IMPLANT
GAUZE 4X4 16PLY ~~LOC~~+RFID DBL (SPONGE) ×1 IMPLANT
GLOVE BIO SURGEON STRL SZ 6.5 (GLOVE) ×1 IMPLANT
GOWN STRL REUS W/ TWL LRG LVL3 (GOWN DISPOSABLE) ×2 IMPLANT
HEMOSTAT SNOW SURGICEL 2X4 (HEMOSTASIS) ×1 IMPLANT
HOOK RETRACT STAY FNGR BLNT 12 (MISCELLANEOUS) IMPLANT
KIT BASIN OR (CUSTOM PROCEDURE TRAY) ×1 IMPLANT
KIT TURNOVER KIT B (KITS) ×1 IMPLANT
LOCATOR NERVE 3 VOLT (DISPOSABLE) IMPLANT
MARKER SKIN DUAL TIP RULER LAB (MISCELLANEOUS) ×1 IMPLANT
NDL HYPO 25GX1X1/2 BEV (NEEDLE) ×1 IMPLANT
NEEDLE HYPO 25GX1X1/2 BEV (NEEDLE) ×1 IMPLANT
NS IRRIG 1000ML POUR BTL (IV SOLUTION) ×1 IMPLANT
PAD ARMBOARD POSITIONER FOAM (MISCELLANEOUS) ×2 IMPLANT
PENCIL SMOKE EVACUATOR (MISCELLANEOUS) ×1 IMPLANT
POSITIONER HEAD DONUT 9IN (MISCELLANEOUS) ×1 IMPLANT
PROBE NERVBE PRASS .33 (MISCELLANEOUS) ×1 IMPLANT
SET WALTER ACTIVATION W/DRAPE (SET/KITS/TRAYS/PACK) IMPLANT
SPONGE INTESTINAL PEANUT (DISPOSABLE) ×1 IMPLANT
STAPLER SKIN PROX 35W (STAPLE) IMPLANT
SUT MNCRL AB 4-0 PS2 18 (SUTURE) IMPLANT
SUT MON AB 5-0 PS2 18 (SUTURE) IMPLANT
SUT PLAIN GUT FAST 5-0 (SUTURE) IMPLANT
SUT SILK 2 0 REEL (SUTURE) ×1 IMPLANT
SUT SILK 2 0 SH CR/8 (SUTURE) ×1 IMPLANT
SUT VIC AB 3-0 SH 8-18 (SUTURE) ×1 IMPLANT
SUT VIC AB 4-0 RB1 18 (SUTURE) ×1 IMPLANT
TOWEL GREEN STERILE FF (TOWEL DISPOSABLE) ×1 IMPLANT
TRAY ENT MC OR (CUSTOM PROCEDURE TRAY) ×1 IMPLANT
TUBE ENDOTRAC NIMS EMG 8MM (MISCELLANEOUS) IMPLANT

## 2024-05-13 NOTE — Progress Notes (Signed)
 Pt arrived to 6 north room 15 alert and oriented x4. JP to left lower neck in charge position. Bed in lowest position. Call light in reach. Wife at bedside. All needs met at this time.

## 2024-05-13 NOTE — H&P (Signed)
 Pre-Operative H&P - Day Of Surgery Patient Name: Thomas [name] Date:   05/13/2024  HPI: [name] is a 43 y.o. male who presents today for operative treatment of thyroid  carcinoma. Patient denies recent significant changes to health or significant new medications or physiologic change in condition which would immediately impact plans. No new types of therapy has been initiated that would change the plan or the appropriateness of the plan.   ROS:  A complete review of systems was obtained and is otherwise negative.   PMH:  Past Medical History:  Diagnosis Date   Cancer (HCC)    Diabetes mellitus without complication (HCC)    type 2   Dyspnea    albuterol  inhaler prn   GERD (gastroesophageal reflux disease)    omeprazole    History of kidney stones 03/2015   in [place] - passed [place]   HLD (hyperlipidemia)    crestor    Hypertension    losartan    Plantar fasciitis    hx - wears insoles in shoes   Pneumonia    x several   Thyroid  nodule 03/2024   right    PSH:  Past Surgical History:  Procedure Laterality Date   broken arm Left    as a child   THYROID  LOBECTOMY Right 04/22/2024   Procedure: LOBECTOMY, THYROID ;  Surgeon: Tobie Eldora NOVAK, MD;  Location: [facility] OR;  Service: ENT;  Laterality: Right;    MEDS:   Current Facility-Administered Medications:    insulin  aspart (novoLOG ) injection 0-14 Units, 0-14 Units, Subcutaneous, Q2H PRN, Keneth Lynwood POUR, MD, 2 Units at 05/13/24 1009   lactated ringers  infusion, , Intravenous, Continuous, Keneth Lynwood POUR, MD, Last Rate: 10 mL/hr at 05/13/24 1025, Continued from Pre-op at 05/13/24 1025  ALLERGIES: Penicillins and Amoxicillin  EXAM: Vitals: BP 120/88 (BP Location: Left Arm)   Pulse 90   Temp 98.1 F (36.7 C) (Oral)   Resp 19   Ht 6' 5 (1.956 m)   Wt (!) 140.6 kg   SpO2 96%   BMI 36.76 kg/m   General Awake, at baseline alertness.   HEENT No scleral icterus or conjunctival hemorrhage. Globe position appears normal. External ears   normal. Nose patent without rhinorrhea. No lymphadenopathy. No thyromegaly  Cardiovascular No cyanosis.  Pulmonary No audible stridor. Breathing easily with no labor.  Neuro Symmetric facial movement.   Psychiatry Appropriate affect and mood.  Skin No lesions on face or neck.  Extermities Moves all extremities with normal range of motion.   Other Findings None. Neck incision well healed   Assessment & Plan: Waseem has diagnoses of thyroid  carcinoma and will go to the OR today for left completion thyroid  lobectomy. Informed consent was obtained and available in EMR today. All questions have been answered, and risks/benefits/alternatives of procedure as noted in the consent were discussed in a quiet area. Questions were invited and answered. The patient expressed understanding, provided consent and wished to proceed despite risks.  Eldora NOVAK Tobie 05/13/2024 12:16 PM

## 2024-05-13 NOTE — Transfer of Care (Signed)
 Immediate Anesthesia Transfer of Care Note  Patient: Thomas [name]  Procedure(s) Performed: THYROIDECTOMY, COMPLETION (Left)  Patient Location: PACU  Anesthesia Type:General  Level of Consciousness: drowsy  Airway & Oxygen Therapy: Patient Spontanous Breathing and Patient connected to face mask oxygen  Post-op Assessment: Report given to RN and Post -op Vital signs reviewed and stable  Post vital signs: Reviewed and stable  Last Vitals:  Vitals Value Taken Time  BP 109/71   Temp 99.1   Pulse 111 05/13/24 16:09  Resp 18 05/13/24 16:09  SpO2 94 % 05/13/24 16:09  Vitals shown include unfiled device data.  Last Pain:  Vitals:   05/13/24 1002  TempSrc:   PainSc: 0-No pain         Complications: No notable events documented.

## 2024-05-13 NOTE — Progress Notes (Signed)
 Dinner tray ordered for pt

## 2024-05-13 NOTE — Anesthesia Procedure Notes (Addendum)
 Procedure Name: Intubation Date/Time: 05/13/2024 12:41 PM  Performed by: Atanacio Arland HERO, CRNAPre-anesthesia Checklist: Patient identified, Emergency Drugs available, Suction available and Patient being monitored Patient Re-evaluated:Patient Re-evaluated prior to induction Oxygen Delivery Method: Circle System Utilized Preoxygenation: Pre-oxygenation with 100% oxygen Induction Type: IV induction Ventilation: Mask ventilation without difficulty Laryngoscope Size: Glidescope and 4 Grade View: Grade I Tube type: Oral Tube size: 8.0 mm Number of attempts: 2 Airway Equipment and Method: Rigid stylet Placement Confirmation: ETT inserted through vocal cords under direct vision, positive ETCO2 and breath sounds checked- equal and bilateral Secured at: 24 cm Tube secured with: Tape Dental Injury: Teeth and Oropharynx as per pre-operative assessment  Comments: NIMS tube

## 2024-05-13 NOTE — Op Note (Signed)
 Otolaryngology Operative note  KYLIL [name] Date/Time of Admission: 05/13/2024  9:28 AM  CSN: [id number];MRN:[id number]  DOB: September 21, 1981 Age: 43 y.o. Location: [facility] OR    Pre-Op Diagnosis: Papillary thyroid  Carcinoma  Post-Op Diagnosis: Same  Procedure: Left Completion thyroidectomy - CPT 39739 - Mod 58  Surgeon: Eldora Blanch, MD  Anesthesia type:  General  Anesthesiologist: Anesthesiologist: Keneth Lynwood POUR, MD CRNA: Delores Dus, CRNA; Atanacio Arland HERO, CRNA; Kufeji, Omotara J, CRNA   Staff: Circulator: Henry Lauraine PARAS, RN Scrub Person: Dyane Greig BRAVO RN First Assistant: Lenon Keven CROME, RN  Implants: * No implants in log *  Specimens: ID Type Source Tests Collected by Time Destination  1 : Left thyroid  lobe Tissue PATH ENT excision SURGICAL PATHOLOGY Blanch Eldora NOVAK, MD 05/13/2024 1525     EBL: 150 mL  Drains: 19 Fr Suction Drain  Post-op disposition and condition: PACU, hemodynamically stable  Complications: None apparent  Indications and consent:  Thomas [name] is a 43 y.o. male with follicular variant of papillary thyroid  carcinoma discovered on prior lobectomy. The patient's options were discussed, including risks/benefits/alternatives for each option. Patient expressed understanding, and despite these risks, consented and decided to proceed with above procedures. Informed consent was signed before proceeding.  Findings:  Successful left completion thyroidectomy Recurrent laryngeal nerve was identified, preserved, and noted to be stimulating appropriately bilaterally  Procedure: After being properly identified in the preoperative holding area, the patient was brought into the operating suite.  Patient was placed supine on the operating table.  A pre-procedural time-out was performed. Pre-operative antibiotics and steroids were administered. After induction of general anesthesia, the patient was successfully intubated with a NIM endotracheal tube confirmed with  CO2 return. Proper tube placement was confirmed. The patient was prepped and draped in the usual fashion for this procedure.   Using a marking pen, a line was drawn horizontally overlying the prior incision line and the skin injected with 1% Lidocaine  with 1:100000 epinephrine . The skin and subcutaneous tissues were sharply dissected to the level of the platysma. The platysma itself was then incised and sub-platysmal flaps were elevated. The midline raphe between the strap muscles was identified. This was divided from the thyroid  cartilage to near the sternal notch. A small seroma was evacuated from right thyroid  bed and irrigated with saline. The strap muscles were retracted laterally exposing the left thyroid  lobe after using assistance of the prior placed prolene suture. The thyroid  lobe was retracted medially and the strap muscles overlying this lobe were carefully dissected free. The superior pole was identified and with the thyroid  retracted inferiorly, the vessels were progressively ligated with 2-0 silk suture and hemo-clips. Care was taken to stay very near the gland to prevent injury to the superior laryngeal nerve. With the superior pole freed, attachments along the mid thyroid  were released and the lobe was retracted medially. Working lateral to the cricothyroid joint, the recurrent laryngeal nerve was identified. The nerve was stimulated and was noted to stimulate appropriately. The nerve was then dissected along its course proximally and distally. Soft tissue attachments were lifted off the nerve further freeing the gland and allowing it to roll medially. The inferior pole was then controlled by identifying, and ligating inferior pole vessels using 2-0 silk ties and hemo-clips. Superior and inferior parathyroid gland candidates were identified and preserved  Finally, the gland was lifted off the trachea and berry's ligament was ligated and the lobe was lifted off the trachea. The specimen was  passed off the field  as a permanent specimen.   Bipolar cautery was used for hemostasis. Hemostasis was then obtained, confirmed with valsalva, and the wound bed was copiously irrigated with saline. Additional hemostatic agent (Suricel SNoW) was used. A 19 Fr suction drain was inserted, brought through the skin and secured with a 2-0 silk suture. 3-0 vicryl interrupted sutures were use to close the strap muscles. 3-0 vicryl interrupted sutures were used to close the platysma and inverted 4-0 vicryl sutures for the deep dermis in an interrupted fashion. A 5-0 fast gut suture was used to approximate the skin in a running fashion. Bacitracin  ointment was then applied after skin was cleansed.  With the surgical portion of the procedure complete, all instrumentation was then removed from the operative field. Patient was returned to the care of the anesthesia team.  Patient was then weaned from the anesthetic and transported to the PACU in stable condition.  Eldora KATHEE Blanch  Plan: Admit for observation overnight.

## 2024-05-14 ENCOUNTER — Encounter (HOSPITAL_COMMUNITY): Payer: Self-pay | Admitting: Otolaryngology

## 2024-05-14 DIAGNOSIS — Z7984 Long term (current) use of oral hypoglycemic drugs: Secondary | ICD-10-CM | POA: Diagnosis not present

## 2024-05-14 DIAGNOSIS — K219 Gastro-esophageal reflux disease without esophagitis: Secondary | ICD-10-CM | POA: Diagnosis not present

## 2024-05-14 DIAGNOSIS — Z79899 Other long term (current) drug therapy: Secondary | ICD-10-CM | POA: Diagnosis not present

## 2024-05-14 DIAGNOSIS — C73 Malignant neoplasm of thyroid gland: Secondary | ICD-10-CM | POA: Diagnosis not present

## 2024-05-14 DIAGNOSIS — E119 Type 2 diabetes mellitus without complications: Secondary | ICD-10-CM | POA: Diagnosis not present

## 2024-05-14 DIAGNOSIS — I10 Essential (primary) hypertension: Secondary | ICD-10-CM | POA: Diagnosis not present

## 2024-05-14 DIAGNOSIS — Z87891 Personal history of nicotine dependence: Secondary | ICD-10-CM | POA: Diagnosis not present

## 2024-05-14 DIAGNOSIS — E785 Hyperlipidemia, unspecified: Secondary | ICD-10-CM | POA: Diagnosis not present

## 2024-05-14 LAB — MAGNESIUM: Magnesium: 2 mg/dL (ref 1.7–2.4)

## 2024-05-14 LAB — GLUCOSE, CAPILLARY
Glucose-Capillary: 153 mg/dL — ABNORMAL HIGH (ref 70–99)
Glucose-Capillary: 221 mg/dL — ABNORMAL HIGH (ref 70–99)
Glucose-Capillary: 287 mg/dL — ABNORMAL HIGH (ref 70–99)

## 2024-05-14 LAB — CALCIUM: Calcium: 9.2 mg/dL (ref 8.9–10.3)

## 2024-05-14 MED ORDER — CALCIUM CARBONATE ANTACID 500 MG PO CHEW: 400.0000 mg | CHEWABLE_TABLET | Freq: Three times a day (TID) | ORAL | 0 refills | Status: AC

## 2024-05-14 MED ORDER — CALCITRIOL 0.5 MCG PO CAPS: 0.5000 ug | ORAL_CAPSULE | Freq: Two times a day (BID) | ORAL | 1 refills | Status: AC

## 2024-05-14 MED ORDER — IBUPROFEN 400 MG PO TABS
400.0000 mg | ORAL_TABLET | Freq: Four times a day (QID) | ORAL | 0 refills | Status: AC
Start: 2024-05-14 — End: ?

## 2024-05-14 MED ORDER — CELECOXIB 100 MG PO CAPS: 100.0000 mg | ORAL_CAPSULE | Freq: Two times a day (BID) | ORAL | Status: DC | PRN

## 2024-05-14 MED ORDER — BACITRACIN ZINC 500 UNIT/GM EX OINT: 1.0000 | TOPICAL_OINTMENT | Freq: Two times a day (BID) | CUTANEOUS | 0 refills | Status: AC

## 2024-05-14 MED ORDER — LEVOTHYROXINE SODIUM 200 MCG PO TABS: 200.0000 ug | ORAL_TABLET | Freq: Every day | ORAL | 2 refills | Status: DC

## 2024-05-14 MED ORDER — ACETAMINOPHEN 650 MG RE SUPP: 650.0000 mg | RECTAL | 0 refills | Status: AC

## 2024-05-14 NOTE — Progress Notes (Signed)
 Attempted to notify on call ENT as per Amion and as per advised by Charge Nurse to Dr. Norleen Notice. No response acquired.

## 2024-05-14 NOTE — Anesthesia Postprocedure Evaluation (Signed)
 Anesthesia Post Note  Patient: Thomas [name]  Procedure(s) Performed: THYROIDECTOMY, COMPLETION (Left: Neck)     Patient location during evaluation: PACU Anesthesia Type: General Level of consciousness: awake and alert Pain management: pain level controlled Vital Signs Assessment: post-procedure vital signs reviewed and stable Respiratory status: spontaneous breathing, nonlabored ventilation, respiratory function stable and patient connected to nasal cannula oxygen Cardiovascular status: blood pressure returned to baseline and stable Postop Assessment: no apparent nausea or vomiting Anesthetic complications: no   No notable events documented.         Lynwood MARLA Cornea

## 2024-05-14 NOTE — Progress Notes (Signed)
 Patient complains of head ache located on the left temporal part of the head.Pain medication given as ordered.

## 2024-05-14 NOTE — Discharge Summary (Signed)
 Physician Discharge Summary  Patient ID: Thomas [name] MRN: [id number] DOB/AGE: 1981-07-20 43 y.o.  Admit date: 05/13/2024 Discharge date: 05/14/2024  Admission Diagnoses:  Principal Problem:   Thyroid  cancer [facility])   Discharge Diagnoses:  Same  Surgeries: Procedure(s): THYROIDECTOMY, COMPLETION on 05/13/2024   Consultants: None  Discharged Condition: Improved  Hospital Course: Thomas [name] is an 43 y.o. male who was admitted 05/13/2024 after completion thyroidectomy for a diagnosis of Thyroid  cancer (HCC).  They were brought to the operating room on 05/13/2024 and underwent the above named procedure. Mr. Rosten did well in recovery and was admitted for overnight observation. He did well overnight and had met his post-operative milestones including PO intake day after surgery. Calcium  was within normal limits POD 1 on supplementation. Exam as below. Given this, joint decision was made for discharge.  Physical Exam:  General: Awake and alert, no acute distress Neck: soft, flat, incision c/d/I; chvostek negative; drain with ss output Throat: Voice strong, clear OP Respiratory: Respiratory effort is normal  Recent vital signs:  Vitals:   05/14/24 0847 05/14/24 1521  BP: 126/84 126/81  Pulse: 60 (!) 104  Resp: 19 16  Temp: 98.9 F (37.2 C) 98.7 F (37.1 C)  SpO2: 100% 96%    Recent laboratory studies:  Results for orders placed or performed during the hospital encounter of 05/13/24  Glucose, capillary   Collection Time: 05/13/24  9:51 AM  Result Value Ref Range   Glucose-Capillary 149 (H) 70 - 99 mg/dL   Comment 1 Notify RN    Comment 2 Document in Chart   Glucose, capillary   Collection Time: 05/13/24 11:56 AM  Result Value Ref Range   Glucose-Capillary 144 (H) 70 - 99 mg/dL   Comment 1 Notify RN    Comment 2 Document in Chart   Glucose, capillary   Collection Time: 05/13/24  4:14 PM  Result Value Ref Range   Glucose-Capillary 184 (H) 70 - 99 mg/dL   Comment 1  Notify RN   Glucose, capillary   Collection Time: 05/13/24  9:54 PM  Result Value Ref Range   Glucose-Capillary 237 (H) 70 - 99 mg/dL  Magnesium   Collection Time: 05/14/24  6:29 AM  Result Value Ref Range   Magnesium 2.0 1.7 - 2.4 mg/dL  Glucose, capillary   Collection Time: 05/14/24  8:43 AM  Result Value Ref Range   Glucose-Capillary 153 (H) 70 - 99 mg/dL  Glucose, capillary   Collection Time: 05/14/24 11:47 AM  Result Value Ref Range   Glucose-Capillary 221 (H) 70 - 99 mg/dL  Calcium    Collection Time: 05/14/24  2:21 PM  Result Value Ref Range   Calcium  9.2 8.9 - 10.3 mg/dL  Glucose, capillary   Collection Time: 05/14/24  4:51 PM  Result Value Ref Range   Glucose-Capillary 287 (H) 70 - 99 mg/dL    Discharge Medications:   Allergies as of 05/14/2024       Reactions   Penicillins Hives, Rash, Other (See Comments)   As a child   Amoxicillin Hives, Rash        Medication List     STOP taking these medications    omeprazole  20 MG capsule Commonly known as: PRILOSEC       TAKE these medications    acetaminophen  650 MG suppository Commonly known as: TYLENOL  Place 1 suppository (650 mg total) rectally every 4 (four) hours.   albuterol  108 (90 Base) MCG/ACT inhaler Commonly known as: VENTOLIN  HFA Inhale 2  puffs into the lungs every 6 (six) hours as needed for wheezing or shortness of breath.   bacitracin  ointment Apply 1 Application topically 2 (two) times daily for 7 days. Apply to incision on your neck twice daily for 7 days, then switch to vaseline   calcitRIOL  0.5 MCG capsule Commonly known as: ROCALTROL  Take 1 capsule (0.5 mcg total) by mouth 2 (two) times daily for 14 days.   calcium  carbonate 500 MG chewable tablet Commonly known as: TUMS - dosed in mg elemental calcium  Chew 2 tablets (400 mg of elemental calcium  total) by mouth 3 (three) times daily for 14 days.   cholecalciferol 25 MCG (1000 UNIT) tablet Commonly known as: VITAMIN D3 Take  4,000 Units by mouth daily.   Clomid 50 MG tablet Generic drug: clomiPHENE Take 25 mg by mouth daily.   empagliflozin  25 MG Tabs tablet Commonly known as: Jardiance  Take 1 tablet (25 mg total) by mouth daily before breakfast.   fenofibrate  145 MG tablet Commonly known as: TRICOR  Take 1 tablet (145 mg total) by mouth daily.   FreeStyle Libre 3 Sensor Misc PLACE 1 SENSOR ONTO THE SKIN EVERY 14 DAYS TO CHECK GLUCOSE   ibuprofen  400 MG tablet Commonly known as: ADVIL  Take 1 tablet (400 mg total) by mouth every 6 (six) hours.   levothyroxine  200 MCG tablet Commonly known as: SYNTHROID  Take 1 tablet (200 mcg total) by mouth daily before breakfast.   losartan  50 MG tablet Commonly known as: COZAAR  Take 1 tablet (50 mg total) by mouth daily.   metFORMIN  1000 MG tablet Commonly known as: GLUCOPHAGE  Take 1 tablet (1,000 mg total) by mouth 2 (two) times daily with a meal.   multivitamin with minerals Tabs tablet Take 1 tablet by mouth daily.   ondansetron  4 MG disintegrating tablet Commonly known as: ZOFRAN -ODT Take 1 tablet (4 mg total) by mouth every 8 (eight) hours as needed for nausea or vomiting.   rosuvastatin  10 MG tablet Commonly known as: CRESTOR  TAKE 1 TABLET BY MOUTH EVERY DAY               Discharge Care Instructions  (From admission, onward)           Start     Ordered   05/14/24 0000  Discharge wound care:       Comments: Keep neck dry; put bacitracin  ointment twice daily for 7 days to incision, then switch to vaseline   05/14/24 1713            Diagnostic Studies: CT Soft Tissue Neck W Contrast Result Date: 05/07/2024 EXAM: CT NECK WITH CONTRAST 05/07/2024 04:00:15 PM TECHNIQUE: CT of the neck was performed with the administration of intravenous contrast. Multiplanar reformatted images are provided for review. Automated exposure control, iterative reconstruction, and/or weight based adjustment of the mA/kV was utilized to reduce the radiation  dose to as low as reasonably achievable. COMPARISON: Thyroid  ultrasound 03/04/2024. CLINICAL HISTORY: Thyroid  cancer; pre-op. FINDINGS: AERODIGESTIVE TRACT: There is medial positioning of the right aryepiglottic fold and right vocal folds with asymmetric prominence of the right puriform sinus concerning for vocal cord paralysis. Otherwise there is no evidence of mass or abnormal enhancement along the aerodigestive structures. Nasopharynx and oropharynx are symmetric. Symmetric appearance of the tonsils. The oral cavity, floor of mouth, and base of tongue are unremarkable. Normal appearance of the epiglottis. No retropharyngeal effusion. SALIVARY GLANDS: The parotid and submandibular glands are unremarkable. THYROID : Interval postoperative changes of right thyroidectomy surgical clips and postsurgical stranding  adjacent to the surgical site. There is ill-defined fluid along the right thyroidectomy bed likely reflecting a postoperative seroma without enhancement to suggest abscess. The residual left thyroid  lobe is unremarkable. LYMPH NODES: There are mildly prominent bilateral level 2 cervical nodes measuring up to 0.9 cm in short axis on the right and 0.6 cm on the left with retained fatty hila. Otherwise there are no enlarged cervical lymph nodes. SOFT TISSUES: No mass or fluid collection. BRAIN, ORBITS, SINUSES AND MASTOIDS: No acute abnormality. LUNGS AND MEDIASTINUM: No acute abnormality. BONES: No focal bone abnormality. IMPRESSION: 1. Interval postoperative changes of right thyroidectomy with likely postoperative seroma. No enhancement to suggest abscess. 2. Medial positioning of the right aryepiglottic fold and right vocal folds with asymmetric prominence of the right piriform sinus, concerning for vocal cord paralysis. Recommend correlation with direct visualization. 3. Mildly prominent bilateral level 2 cervical nodes with retained fatty hila, measuring up to 0.9 cm on the right and 0.6 cm on the left,  likely reactive. No enhancing or calcified cervical lymph nodes. 4. No mass or abnormal enhancement along the visualized aerodigestive structures in the neck. Electronically signed by: Donnice Mania MD 05/07/2024 04:20 PM EDT RP Workstation: [id number]    Disposition: Discharge disposition: 01-Home or Self Care       Discharge Instructions     Diet general   Complete by: As directed    Discharge wound care:   Complete by: As directed    Keep neck dry; put bacitracin  ointment twice daily for 7 days to incision, then switch to vaseline   Increase activity slowly   Complete by: As directed           Signed: Eldora KATHEE Blanch 05/14/2024, 5:17 PM

## 2024-05-14 NOTE — Progress Notes (Signed)
 Education on JP drain including how to empty was done with pt and wife. All questions were answered.

## 2024-05-14 NOTE — Progress Notes (Signed)
 Notified Dr. Eldora Blanch about the clarification of the patient if he will have a calcium  test tomorrow.

## 2024-05-14 NOTE — TOC Initial Note (Signed)
 Transition of Care [facility]) - Initial/Assessment Note    Patient Details  Name: Thomas [name] MRN: [id number] Date of Birth: 1981-05-10  Transition of Care [facility]) CM/SW Contact:    Jeoffrey LITTIE Moose, LCSW Phone Number: 05/14/2024, 9:24 AM  Clinical Narrative:                 Pt admitted from home for surgery on his right thyroid  nodule. No current TOC needs, please consult as needs arise.        Patient Goals and CMS Choice            Expected Discharge Plan and Services                                              Prior Living Arrangements/Services                       Activities of Daily Living   ADL Screening (condition at time of admission) Independently performs ADLs?: Yes (appropriate for developmental age) Is the patient deaf or have difficulty hearing?: No Does the patient have difficulty seeing, even when wearing glasses/contacts?: No Does the patient have difficulty concentrating, remembering, or making decisions?: No  Permission Sought/Granted                  Emotional Assessment              Admission diagnosis:  Thyroid  cancer [facility]) [C73] Patient Active Problem List   Diagnosis Date Noted   Thyroid  cancer (HCC) 05/04/2024   Thyroid  nodule 04/22/2024   Right thyroid  nodule 04/22/2024   Hypertension associated with diabetes (HCC) 05/16/2020   Hyperlipidemia associated with type 2 diabetes mellitus (HCC) 01/27/2020   GERD without esophagitis 01/27/2020   Fatty liver 01/27/2020   Type 2 diabetes mellitus with other specified complication (HCC) 01/24/2017   PLEVA (pityriasis lichenoides et varioliformis acuta) 12/28/2015   PCP:  Dettinger, Fonda LABOR, MD Pharmacy:   [facility] [phone] - [place], [place] - [address] [place] [address] Phone: [phone] Fax: [phone]     Social Drivers of Health (SDOH) Social History: SDOH Screenings   Food Insecurity: No Food Insecurity  (05/13/2024)  Housing: Low Risk  (05/13/2024)  Transportation Needs: No Transportation Needs (05/13/2024)  Utilities: Not At Risk (05/13/2024)  Depression (PHQ2-9): Low Risk  (05/04/2024)  Social Connections: Unknown (02/09/2022)   Received from [facility]  Tobacco Use: Medium Risk (05/13/2024)   SDOH Interventions:     Readmission Risk Interventions     No data to display

## 2024-05-14 NOTE — Discharge Instructions (Signed)
 Surgery Discharge Instructions:  Call clinic or return to ED if you: - develop a fever greater than 101.4 - have shaking chills or are feeling ill - become short of breath - have uncontrollable nausea or vomiting - can't hold down food or liquids or feel as though you are getting dehydrated - have leakage or drainage from wound - urine output of less than 30cc/hr for 12 hours - develop significant redness, pain at incision(s) or wound opens up/separates - any other acute events, problems, or concerns  Wound Care/Dressings/Drain Instructions:  - To take care of your incision/cut:  - Apply bacitracin  ointment to the incision twice per day for 7 days. Then switch to vaseline. - It is ok to shower in 48 hours neck down, but avoid rubbing the incision or submerging under water - Your stitches are  absorbable and will dissolve on their own  - Take synthroid  200mcg daily 30 mins before breakfast - Calcium : sometimes after surgery, you can get low calcium  until the rest of your calcium  glands wake up and start making more parathyroid hormone. To make sure you do not get low calcium , we have prescribed you medication (TUMS and Rocaltrol ). Please take them according to the instructions - 1000mg  of TUMS 3 times per day and 0.5mcg of rocaltrol  twice per day. If you have any numbness or tingling of hands or toes despite taking these pills, please take an additional pill (500mg ) of TUMS every 15 minutes for 2 doses. If symptoms still persist, please call the clinic. You may need your calcium  level checked. Do not take your home acid reflux medicine while taking this.  Take care of the drain and empty twice daily as shown by the nurse and record what comes out.   Medications: - Resume your regular home medications except as detailed in the medication reconciliation see above.  - For pain, take tylenol  650mg  every 4 hours and Ibuprofen  400mg  every 6 hours. If that is not sufficient, take the 5mg  tablet of  oxycodone  which is already prescribed. Do not mix with any other narcotic medication.  Follow Up:  - A follow up appointment should be scheduled for you after discharge as in your discharge paperwork  Activity/Restrictions:  - Resume your regular activities, as tolerated. Avoid heavy lifting or straining (more than 5 lbs) for 2 weeks.  Diet: - Resume your regular diet, as tolerated  Additional Instructions: - Please take an over the counter stool softener while taking narcotic pain medication - DO NOT MIX NARCOTIC PAIN MEDICATIONS OR TAKE NARCOTIC PRESCRIPTIONS AT THE SAME TIME - DO NOT DRIVE OR OPERATE HEAVY MACHINERY WHILE ON NARCOTICS  - DO NOT TAKE MORE THAN 4 GRAMS (4000mg ) OF TYLENOL  (ACETAMINOPHEN ) IN 24 HOURS

## 2024-05-14 NOTE — Plan of Care (Signed)
   Problem: Education: Goal: Knowledge of General Education information will improve Description Including pain rating scale, medication(s)/side effects and non-pharmacologic comfort measures Outcome: Progressing

## 2024-05-14 NOTE — Progress Notes (Signed)
 Attempted to inform Dr. Eldora Blanch about the complains of head ache of the patient. Charge Nurse aware of the situation.

## 2024-05-14 NOTE — Progress Notes (Signed)
 ENT Progress Note  S/p completion thyroidectomy on 05/13/2024 S: NAEON. Headache improving; no perioral numbness, tingling. Fingers no numbness. Voice strong. No dysphagia. Labs not drawn overnight. Pending  Blood pressure (!) 123/90, pulse 94, temperature 97.6 F (36.4 C), temperature source Oral, resp. rate 18, height 6' 5 (1.956 m), weight (!) 140.6 kg, SpO2 98%.  PHYSICAL EXAM: Neck incision c/d/I, no hematoma Voice strong, no breathiness Chvostek negative Drain with expected dark ss drainage   Assessment/Plan: S/p completion thyroidectomy on 05/13/2024 for papillary thyroid  carcinoma  Pain meds as appropriate Meeting milestones, but need to ensure calcium  stable Drain care - regular; will need to teach before d/c On calcitriol  and TUMS Synthroid  200mcg If calcium  stable this AM and this PM, ok for discharge  Page ENT with questions Eldora KATHEE Blanch   05/14/2024, 7:36 AM

## 2024-05-15 LAB — PTH, INTACT AND CALCIUM
Calcium, Total (PTH): 8.9 mg/dL (ref 8.7–10.2)
PTH: 7 pg/mL — ABNORMAL LOW (ref 15–65)

## 2024-05-15 LAB — SURGICAL PATHOLOGY

## 2024-05-18 ENCOUNTER — Ambulatory Visit (INDEPENDENT_AMBULATORY_CARE_PROVIDER_SITE_OTHER): Admitting: Otolaryngology

## 2024-05-18 ENCOUNTER — Encounter (INDEPENDENT_AMBULATORY_CARE_PROVIDER_SITE_OTHER): Payer: Self-pay | Admitting: Otolaryngology

## 2024-05-18 VITALS — BP 124/85 | HR 102 | Ht 77.0 in

## 2024-05-18 DIAGNOSIS — C73 Malignant neoplasm of thyroid gland: Secondary | ICD-10-CM

## 2024-05-18 DIAGNOSIS — Z9889 Other specified postprocedural states: Secondary | ICD-10-CM

## 2024-05-18 MED ORDER — ONDANSETRON 4 MG PO TBDP: 4.0000 mg | ORAL_TABLET | Freq: Three times a day (TID) | ORAL | 0 refills | Status: AC | PRN

## 2024-05-18 NOTE — Progress Notes (Signed)
 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 Patient returns for follow up. S: Doing well from surgical site standpoint but just feels a bit ill with intermittent headaches. No cough, no fevers. No hypocalcemia symptoms. Incision expected numbness but otherwise no significant swelling. No voice or swallowing issues. We discussed his pathology.  PTH post op was 7, with Ca stable at 8.9 and 9.2  O:  No acute distress Voice strong, no breathiness; TFL was indicated to better evaluate the proximal airway, given the patient's history and exam findings, and is detailed below. Incision c/d/I, no evidence of hematoma; drain with light ss output, 20 cc over last 24 hours, removed Chvostek negative  Procedure Note Pre-procedure diagnosis: Post-op after thyroid  surgery Post-procedure diagnosis: Same Procedure: Transnasal Fiberoptic Laryngoscopy, CPT [phone] Indication: see above Complications: None apparent EBL: 0 mL  The procedure was undertaken to further evaluate the patient's complaint of dysphonia, with mirror exam inadequate for appropriate examination due to gag reflex and poor patient tolerance  Procedure:  Patient was identified as correct patient. Verbal consent was obtained. The nose was sprayed with oxymetazoline and 4% lidocaine . The The flexible laryngoscope was passed through the nose to view the nasal cavity, pharynx (oropharynx, hypopharynx) and larynx.  The larynx was examined at rest and during multiple phonatory tasks. Documentation was obtained and reviewed with patient. The scope was removed. The patient tolerated the procedure well.  Findings: The nasal cavity and nasopharynx did not reveal any masses or lesions, mucosa appeared to be without obvious  lesions. The tongue base, pharyngeal walls, piriform sinuses, vallecula, epiglottis and postcricoid region are normal in appearance.The visualized portion of the subglottis and proximal trachea is widely patent. The vocal folds are mobile bilaterally. There are no lesions on the free edge of the vocal folds nor elsewhere in the larynx worrisome for malignancy.     A/P: 43 y.o. 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 Now has had total thyroidectomy  Post op VF function wnl Post-op PTH was 7, currently on calcium  supplementation. Will continue 1 more week and check PTH before follow up in 2 weeks with Chyrl Will refer to Dr. Dale (Endo) Unclear why he is having some malaise - no red flag symptoms currently; will observe, return precautions discussed

## 2024-05-18 NOTE — Patient Instructions (Signed)
 Get your labs at [facility] about 2 days before your follow up.  Can go to any [facility]

## 2024-05-20 ENCOUNTER — Ambulatory Visit: Payer: Self-pay | Admitting: Family Medicine

## 2024-05-20 NOTE — Progress Notes (Signed)
 Patient aware, viewed results and provider comments on MyChart

## 2024-05-25 ENCOUNTER — Telehealth (INDEPENDENT_AMBULATORY_CARE_PROVIDER_SITE_OTHER): Payer: Self-pay | Admitting: Otolaryngology

## 2024-05-25 NOTE — Telephone Encounter (Signed)
 Called and spoke with patient.  I let him know that his FMLA paperwork for his 2nd surgery was faxed on 05/21/2024 and the Surgery Confirmation Form was faxed 05/25/2024.

## 2024-05-27 DIAGNOSIS — C73 Malignant neoplasm of thyroid gland: Secondary | ICD-10-CM | POA: Diagnosis not present

## 2024-05-28 LAB — PARATHYROID HORMONE, INTACT (NO CA): PTH: 11 pg/mL — ABNORMAL LOW (ref 15–65)

## 2024-05-29 ENCOUNTER — Encounter (INDEPENDENT_AMBULATORY_CARE_PROVIDER_SITE_OTHER): Payer: Self-pay | Admitting: Physician Assistant

## 2024-05-29 ENCOUNTER — Ambulatory Visit (INDEPENDENT_AMBULATORY_CARE_PROVIDER_SITE_OTHER): Admitting: Physician Assistant

## 2024-05-29 VITALS — BP 132/83 | HR 81

## 2024-05-29 DIAGNOSIS — C73 Malignant neoplasm of thyroid gland: Secondary | ICD-10-CM

## 2024-05-29 DIAGNOSIS — Z9889 Other specified postprocedural states: Secondary | ICD-10-CM

## 2024-05-29 NOTE — Progress Notes (Signed)
 Dear Dr. Maryanne, Here is my assessment for our mutual patient, Thomas [name]. Thank you for allowing me the opportunity to care for your patient. Please do not hesitate to contact me should you have any other questions. Sincerely, Chyrl Cohen PA-C  Otolaryngology Clinic Note Referring provider: Dr. Maryanne HPI:  Thomas [name] is a 43 y.o. male kindly referred by Dr. 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  He was last seen in the office on 05/20/2024 by Dr. Tobie.  Since his last office visit he notes he has been doing well.  He feels like he is stabilized.  He denies any significant swelling of the neck, he notes some firmness at the incision site.  He denies any systemic symptoms, no signs or symptoms of hypocalcemia.  He will be returning to work next week.  Voice normal.   Independent Review of Additional Tests or Records:   PTH on 05/27/2024 11  PMH/Meds/All/SocHx/FamHx/ROS:   Past Medical History:  Diagnosis Date   Cancer (HCC)    Diabetes mellitus without complication (HCC)    type 2   Dyspnea    albuterol  inhaler prn   GERD (gastroesophageal reflux disease)    omeprazole    History of kidney stones 03/2015   in [place] - passed stone   HLD (hyperlipidemia)    crestor    Hypertension    losartan    Plantar fasciitis    hx - wears insoles in shoes   Pneumonia    x several   Thyroid  nodule 03/2024   right     Past Surgical History:  Procedure Laterality Date   broken arm Left    as a child   THYROID  LOBECTOMY Right 04/22/2024   Procedure: LOBECTOMY, THYROID ;  Surgeon: Tobie Eldora NOVAK, MD;  Location: [facility] OR;  Service: ENT;  Laterality: Right;   THYROIDECTOMY, COMPLETION Left 05/13/2024   Procedure: THYROIDECTOMY,  COMPLETION;  Surgeon: Tobie Eldora NOVAK, MD;  Location: [facility] OR;  Service: ENT;  Laterality: Left;    Family History  Problem Relation Age of Onset   Diabetes Father    Kidney Stones Father    Hypertension Father    Heart disease Maternal Grandmother    COPD Maternal Grandmother    Heart disease Maternal Grandfather    Hypertension Maternal Grandfather    Diabetes Paternal Grandmother    Diabetes Paternal Grandfather    Heart disease Paternal Grandfather      Social Connections: Unknown (02/09/2022)   Received from [facility]   Social Network    Social Network: Not on file      Current Outpatient Medications:    acetaminophen  (TYLENOL ) 650 MG suppository, Place 1 suppository (650 mg total) rectally every 4 (four) hours., Disp: 12 suppository, Rfl: 0   albuterol  (VENTOLIN  HFA) 108 (90 Base) MCG/ACT inhaler, Inhale 2 puffs into the lungs every 6 (six) hours as needed for wheezing or shortness of breath., Disp: 8 g, Rfl: 2   cholecalciferol (VITAMIN D3) 25 MCG (1000 UNIT) tablet, Take 4,000 Units by mouth daily., Disp: , Rfl:    CLOMID 50 MG tablet, Take 25 mg by mouth daily., Disp: , Rfl:    Continuous Glucose Sensor (FREESTYLE LIBRE 3 SENSOR) MISC, PLACE 1 SENSOR ONTO THE SKIN  EVERY 14 DAYS TO CHECK GLUCOSE, Disp: 6 each, Rfl: 3   empagliflozin  (JARDIANCE ) 25 MG TABS tablet, Take 1 tablet (25 mg total) by mouth daily before breakfast., Disp: 90 tablet, Rfl: 3   fenofibrate  (TRICOR ) 145 MG tablet, Take 1 tablet (145 mg total) by mouth daily., Disp: 90 tablet, Rfl: 3   ibuprofen  (ADVIL ) 400 MG tablet, Take 1 tablet (400 mg total) by mouth every 6 (six) hours., Disp: 30 tablet, Rfl: 0   levothyroxine  (SYNTHROID ) 200 MCG tablet, Take 1 tablet (200 mcg total) by mouth daily before breakfast., Disp: 30 tablet, Rfl: 2   losartan  (COZAAR ) 50 MG tablet, Take 1 tablet (50 mg total) by mouth daily., Disp: 90 tablet, Rfl: 3   metFORMIN  (GLUCOPHAGE ) 1000 MG tablet, Take 1 tablet (1,000 mg total)  by mouth 2 (two) times daily with a meal. (Patient not taking: Reported on 05/18/2024), Disp: 180 tablet, Rfl: 3   Multiple Vitamin (MULTIVITAMIN WITH MINERALS) TABS tablet, Take 1 tablet by mouth daily., Disp: , Rfl:    ondansetron  (ZOFRAN -ODT) 4 MG disintegrating tablet, Take 1 tablet (4 mg total) by mouth every 8 (eight) hours as needed for nausea or vomiting., Disp: 20 tablet, Rfl: 0   rosuvastatin  (CRESTOR ) 10 MG tablet, TAKE 1 TABLET BY MOUTH EVERY DAY, Disp: 90 tablet, Rfl: 3   Physical Exam:   There were no vitals taken for this visit.  Pertinent Findings  No acute distress, Strong voice, no respiratory distress or abnormalities Neck supple, Incision CDI, no swelling, redness, or erythema Chvostek negative   Seprately Identifiable Procedures:  None  Impression & Plans:  Thomas [name] is a 43 y.o. male with the following   Postop follow-up-  43 year old gentleman seen in our office for postop follow-up.  He is doing well today.  Dr. Tobie would recommend discontinuing calcium .  His incision is clean dry and intact with no signs of hematoma, no signs of infection.  The patient does have follow-up with endocrinology this September, he is encouraged to continue this appointment.  If he develops any new or worsening signs or symptoms in the meantime will reach out to the office.  The patient was given strict return precautions.  He verbalized understanding and agreement to today's plan had no further questions or concerns.   - f/u 45-month follow-up with Dr. Tobie   Thank you for allowing me the opportunity to care for your patient. Please do not hesitate to contact me should you have any other questions.  Sincerely, Chyrl Cohen PA-C [facility] Phone: [phone] Fax: [phone]  05/29/2024, 8:40 AM

## 2024-06-26 DIAGNOSIS — E890 Postprocedural hypothyroidism: Secondary | ICD-10-CM | POA: Diagnosis not present

## 2024-06-26 DIAGNOSIS — C73 Malignant neoplasm of thyroid gland: Secondary | ICD-10-CM | POA: Diagnosis not present

## 2024-06-26 DIAGNOSIS — E2089 Other specified hypoparathyroidism: Secondary | ICD-10-CM | POA: Diagnosis not present

## 2024-06-26 NOTE — Progress Notes (Signed)
 Endocrinology Note Date of Visit: 06/26/2024  HPI  #Follicular Variant Papillary Thyroid  Carcinoma, oncocytic subtype, widely invasive, margins positive #Postoperative Hypothyroidism Noticed thyroid  enlargement in the mirror. US  revealed a 4.8 cm nodule. FNA was indeterminate (I cannot see results), and was sent for Afirma, which was positive.  Hx of therapy: 04/22/24- right lobectomy (Dr. Eldora Blanch) 05/13/24- completion thyroidectomy  Interval Hx:  He is on levothyroxine  200 mcg daily. Takes it first thing in the morning with water, waits an hour before he eats or takes other pills.   Denies heart palpitations, chest pain, shortness of breath, insomnia, heat or cold intolerance, energy is good.  No dysphagia or dysphonia.  Reports having a twitch on the left side of his mouth. No other paresthesias or muscle cramps.  Pth was recovering in August. He came off calcium  and calcitriol .   Review of Systems: A 10-system review of systems was performed with pertinent positives and negatives noted in the HPI. All other systems negative.   Medications Current Rx[1]  Allergies Allergies as of 06/26/2024 - Reviewed 06/26/2024  Allergen Reaction Noted  . Penicillins Hives and Rash 09/22/2013    Past Medical History  Problem List[2] Type 2 DM on metformin  and jardiance , well controlled HTN HLD  Family History Grandfather- CAD Father- CAD, prostate cancer Several members with diabetes No other cancers in the family.  Social History Occupation: Works at [employer] Tobacco: No, quit Jan 2024 Alcohol: No Rec drugs: No  Physical Exam Vitals:   06/26/24 1006  BP: (!) 129/90  Pulse: 93  SpO2: 100%  Weight: (!) 143 kg (315 lb)  Height: 1.956 m (6' 5)     General: A&O x 3 Neck: surgical incision c/d/I, with some mild protrusion CV: RRR, no murmurs Resp: CTAB, normal effort Neuro: No tremor, normal reflexes Psych: Normal mood   IMAGING  EXAM:  CT NECK WITH  CONTRAST  05/07/2024 04:00:15 PM   TECHNIQUE:  CT of the neck was performed with the administration of intravenous contrast.  Multiplanar reformatted images are provided for review. Automated exposure  control, iterative reconstruction, and/or weight based adjustment of the mA/kV  was utilized to reduce the radiation dose to as low as reasonably achievable.   COMPARISON:  Thyroid  ultrasound 03/04/2024.   CLINICAL HISTORY:  Thyroid  cancer; pre-op.   FINDINGS:   AERODIGESTIVE TRACT:  There is medial positioning of the right aryepiglottic fold and right vocal  folds with asymmetric prominence of the right puriform sinus concerning for  vocal cord paralysis. Otherwise there is no evidence of mass or abnormal  enhancement along the aerodigestive structures. Nasopharynx and oropharynx are  symmetric. Symmetric appearance of the tonsils. The oral cavity, floor of  mouth, and base of tongue are unremarkable. Normal appearance of the  epiglottis. No retropharyngeal effusion.   SALIVARY GLANDS:  The parotid and submandibular glands are unremarkable.   THYROID :  Interval postoperative changes of right thyroidectomy surgical clips and  postsurgical stranding adjacent to the surgical site. There is ill-defined  fluid along the right thyroidectomy bed likely reflecting a postoperative  seroma without enhancement to suggest abscess. The residual left thyroid  lobe  is unremarkable.   LYMPH NODES:  There are mildly prominent bilateral level 2 cervical nodes measuring up to 0.9  cm in short axis on the right and 0.6 cm on the left with retained fatty hila.  Otherwise there are no enlarged cervical lymph nodes.   SOFT TISSUES:  No mass or fluid collection.   BRAIN,  ORBITS, SINUSES AND MASTOIDS:  No acute abnormality.   LUNGS AND MEDIASTINUM:  No acute abnormality.   BONES:  No focal bone abnormality.   IMPRESSION:  1. Interval postoperative changes of right thyroidectomy with  likely  postoperative seroma. No enhancement to suggest abscess.  2. Medial positioning of the right aryepiglottic fold and right vocal folds  with asymmetric prominence of the right piriform sinus, concerning for vocal  cord paralysis. Recommend correlation with direct visualization.  3. Mildly prominent bilateral level 2 cervical nodes with retained fatty hila,  measuring up to 0.9 cm on the right and 0.6 cm on the left, likely reactive. No  enhancing or calcified cervical lymph nodes.  4. No mass or abnormal enhancement along the visualized aerodigestive  structures in the neck.   No vocal cord paralysis- underwent laryngoscopy with Dr. Tobie.   ASSESSMENT/PLAN  #Follicular Variant Papillary Thyroid  Carcinoma, oncocytic subtype, widely invasive, margins positive #Postoperative Hypothyroidism We reviewed his diagnosis, pathology, risk factors for recurrence, treatment, and surveillance. He is high risk for recurrence given the angioinvasion, and concern for recurrence outside of the neck given this finding.  We will proceed with RAI, and TSH suppression. Will obtain labs today and refer to nuclear medicine. -TSH, FT4, Tg today -NM consult -TSH goal <0.1   #Likely transient postop hypoparathyroidism He is having some facial twitching -CMP today   Return to clinic 4 months  Electronically signed by: Donnice Havens, MD 06/26/2024 10:42 AM       [1] Current Outpatient Medications  Medication Sig Dispense Refill  . empagliflozin  (JARDIANCE ) 25 mg tab Take 25 mg by mouth daily.    . fenofibrate  nanocrystallized (TRICOR ) 145 mg tablet Take 145 mg by mouth daily.    . levothyroxine  (SYNTHROID ) 200 mcg tablet Take 200 mcg by mouth every morning.    . losartan  (COZAAR ) 50 mg tablet Take 50 mg by mouth daily.    . metFORMIN  (GLUCOPHAGE ) 1,000 mg tablet Take 1,000 mg by mouth in the morning and 1,000 mg in the evening. Take with meals.    . omeprazole  (PriLOSEC) 20 mg DR capsule  Take 1 capsule by mouth daily.    . rosuvastatin  (CRESTOR ) 10 mg tablet Take 10 mg by mouth daily.     No current facility-administered medications for this visit.  [2] There is no problem list on file for this patient.

## 2024-06-30 ENCOUNTER — Encounter (INDEPENDENT_AMBULATORY_CARE_PROVIDER_SITE_OTHER): Payer: Self-pay

## 2024-07-01 ENCOUNTER — Encounter (INDEPENDENT_AMBULATORY_CARE_PROVIDER_SITE_OTHER): Admitting: Otolaryngology

## 2024-07-13 ENCOUNTER — Other Ambulatory Visit: Payer: Self-pay | Admitting: Family Medicine

## 2024-07-13 DIAGNOSIS — E781 Pure hyperglyceridemia: Secondary | ICD-10-CM

## 2024-07-13 DIAGNOSIS — E1169 Type 2 diabetes mellitus with other specified complication: Secondary | ICD-10-CM

## 2024-08-05 ENCOUNTER — Encounter: Payer: Self-pay | Admitting: Family Medicine

## 2024-08-05 ENCOUNTER — Ambulatory Visit: Payer: Self-pay | Admitting: Family Medicine

## 2024-08-05 VITALS — BP 114/79 | HR 91 | Temp 98.1°F | Ht 77.0 in | Wt 320.0 lb

## 2024-08-05 DIAGNOSIS — Z23 Encounter for immunization: Secondary | ICD-10-CM

## 2024-08-05 DIAGNOSIS — E785 Hyperlipidemia, unspecified: Secondary | ICD-10-CM

## 2024-08-05 DIAGNOSIS — I152 Hypertension secondary to endocrine disorders: Secondary | ICD-10-CM

## 2024-08-05 DIAGNOSIS — E1169 Type 2 diabetes mellitus with other specified complication: Secondary | ICD-10-CM | POA: Diagnosis not present

## 2024-08-05 DIAGNOSIS — K760 Fatty (change of) liver, not elsewhere classified: Secondary | ICD-10-CM | POA: Diagnosis not present

## 2024-08-05 DIAGNOSIS — E1159 Type 2 diabetes mellitus with other circulatory complications: Secondary | ICD-10-CM | POA: Diagnosis not present

## 2024-08-05 DIAGNOSIS — E781 Pure hyperglyceridemia: Secondary | ICD-10-CM

## 2024-08-05 DIAGNOSIS — E1165 Type 2 diabetes mellitus with hyperglycemia: Secondary | ICD-10-CM

## 2024-08-05 LAB — BAYER DCA HB A1C WAIVED: HB A1C (BAYER DCA - WAIVED): 7.5 % — ABNORMAL HIGH (ref 4.8–5.6)

## 2024-08-05 MED ORDER — METFORMIN HCL 1000 MG PO TABS: 1000.0000 mg | ORAL_TABLET | Freq: Two times a day (BID) | ORAL | 3 refills | Status: AC

## 2024-08-05 MED ORDER — EMPAGLIFLOZIN 25 MG PO TABS: 25.0000 mg | ORAL_TABLET | Freq: Every day | ORAL | 3 refills | Status: AC

## 2024-08-05 MED ORDER — LOSARTAN POTASSIUM 50 MG PO TABS: 50.0000 mg | ORAL_TABLET | Freq: Every day | ORAL | 3 refills | Status: AC

## 2024-08-05 MED ORDER — FENOFIBRATE 145 MG PO TABS: 145.0000 mg | ORAL_TABLET | Freq: Every day | ORAL | 3 refills | Status: AC

## 2024-08-05 NOTE — Progress Notes (Signed)
 BP 114/79   Pulse 91   Temp 98.1 F (36.7 C)   Ht 6' 5 (1.956 m)   Wt (!) 320 lb (145.2 kg)   SpO2 97%   BMI 37.95 kg/m    Subjective:   Patient ID: Thomas [name], male    DOB: 07/14/1981, 43 y.o.   MRN: [id number]  HPI: Thomas [name] is a 43 y.o. male presenting on 08/05/2024 for Medical Management of Chronic Issues and Diabetes   Discussed the use of AI scribe software for clinical note transcription with the patient, who gave verbal consent to proceed.  History of Present Illness   Thomas [name] is a 43 year old male with thyroid  cancer who presents with symptoms related to radioactive iodine treatment.  Thyroid  cancer and radioactive iodine therapy - Currently undergoing treatment for thyroid  cancer and preparing for radioactive iodine therapy - Experiencing swelling and a sensation of feeling 'weird', attributed to the treatment process - Temporarily discontinued thyroid  medication as part of treatment protocol, resulting in noticeable effects - Recent tumor marker blood test returned undetectable results  Thyroid  hormone withdrawal and medication management - Taking levothyroxine  200 mcg daily, with an additional 200 mcg on Saturdays - Endocrinologist increased levothyroxine  dosage to further suppress hormone levels - Jaw muscle tightness present, perceived as unusual and distressing - Social withdrawal and increased reluctance to engage socially since medication changes  Glycemic control and diabetes management - Diabetes managed with metformin  and Jardiance  - Blood glucose levels currently well-controlled with dietary adjustments - A1c is 6.2 - Uses a glucose sensor for monitoring - Experienced increased cravings and irregular eating patterns following last surgery, attributed to thyroid  hormone fluctuations  Psychological distress and anxiety - Increased anxiety related to current treatment and medication changes - Heightened awkwardness and social  withdrawal - Recent ENT visit was challenging due to anxiety          Relevant past medical, surgical, family and social history reviewed and updated as indicated. Interim medical history since our last visit reviewed. Allergies and medications reviewed and updated.  Review of Systems  Constitutional:  Negative for chills and fever.  Eyes:  Negative for visual disturbance.  Respiratory:  Negative for shortness of breath and wheezing.   Cardiovascular:  Negative for chest pain and leg swelling.  Musculoskeletal:  Negative for back pain and gait problem.  Skin:  Negative for rash.  Neurological:  Negative for dizziness and light-headedness.  All other systems reviewed and are negative.   Per HPI unless specifically indicated above   Allergies as of 08/05/2024       Reactions   Penicillins Hives, Rash, Other (See Comments)   As a child   Amoxicillin Hives, Rash        Medication List        Accurate as of August 05, 2024  3:56 PM. If you have any questions, ask your nurse or doctor.          acetaminophen  650 MG suppository Commonly known as: TYLENOL  Place 1 suppository (650 mg total) rectally every 4 (four) hours.   albuterol  108 (90 Base) MCG/ACT inhaler Commonly known as: VENTOLIN  HFA Inhale 2 puffs into the lungs every 6 (six) hours as needed for wheezing or shortness of breath.   cholecalciferol 25 MCG (1000 UNIT) tablet Commonly known as: VITAMIN D3 Take 4,000 Units by mouth daily.   Clomid 50 MG tablet Generic drug: clomiPHENE Take 25 mg by mouth daily.   empagliflozin  25  MG Tabs tablet Commonly known as: Jardiance  Take 1 tablet (25 mg total) by mouth daily before breakfast.   fenofibrate  145 MG tablet Commonly known as: TRICOR  Take 1 tablet (145 mg total) by mouth daily.   FreeStyle Libre 3 Sensor Misc PLACE 1 SENSOR ONTO THE SKIN EVERY 14 DAYS TO CHECK GLUCOSE   ibuprofen  400 MG tablet Commonly known as: ADVIL  Take 1 tablet (400 mg total)  by mouth every 6 (six) hours.   levothyroxine  200 MCG tablet Commonly known as: SYNTHROID  Take 1 tablet (200 mcg total) by mouth daily before breakfast.   losartan  50 MG tablet Commonly known as: COZAAR  Take 1 tablet (50 mg total) by mouth daily.   metFORMIN  1000 MG tablet Commonly known as: GLUCOPHAGE  Take 1 tablet (1,000 mg total) by mouth 2 (two) times daily with a meal.   multivitamin with minerals Tabs tablet Take 1 tablet by mouth daily.   ondansetron  4 MG disintegrating tablet Commonly known as: ZOFRAN -ODT Take 1 tablet (4 mg total) by mouth every 8 (eight) hours as needed for nausea or vomiting.   rosuvastatin  10 MG tablet Commonly known as: CRESTOR  TAKE 1 TABLET BY MOUTH EVERY DAY         Objective:   BP 114/79   Pulse 91   Temp 98.1 F (36.7 C)   Ht 6' 5 (1.956 m)   Wt (!) 320 lb (145.2 kg)   SpO2 97%   BMI 37.95 kg/m   Wt Readings from Last 3 Encounters:  08/05/24 (!) 320 lb (145.2 kg)  05/13/24 (!) 310 lb (140.6 kg)  05/04/24 (!) 315 lb (142.9 kg)    Physical Exam Vitals and nursing note reviewed.  Constitutional:      General: He is not in acute distress.    Appearance: He is well-developed. He is not diaphoretic.  Eyes:     General: No scleral icterus.    Conjunctiva/sclera: Conjunctivae normal.  Neck:     Thyroid : No thyromegaly.  Cardiovascular:     Rate and Rhythm: Normal rate and regular rhythm.     Heart sounds: Normal heart sounds. No murmur heard. Pulmonary:     Effort: Pulmonary effort is normal. No respiratory distress.     Breath sounds: Normal breath sounds. No wheezing.  Musculoskeletal:        General: No swelling. Normal range of motion.     Cervical back: Neck supple.  Lymphadenopathy:     Cervical: No cervical adenopathy.  Skin:    General: Skin is warm and dry.     Findings: No rash.  Neurological:     Mental Status: He is alert and oriented to person, place, and time.     Coordination: Coordination normal.   Psychiatric:        Behavior: Behavior normal.                 Assessment & Plan:   Problem List Items Addressed This Visit       Cardiovascular and Mediastinum   Hypertension associated with diabetes (HCC)   Relevant Medications   empagliflozin  (JARDIANCE ) 25 MG TABS tablet   fenofibrate  (TRICOR ) 145 MG tablet   losartan  (COZAAR ) 50 MG tablet   metFORMIN  (GLUCOPHAGE ) 1000 MG tablet     Digestive   Fatty liver     Endocrine   Type 2 diabetes mellitus with other specified complication (HCC) - Primary   Relevant Medications   empagliflozin  (JARDIANCE ) 25 MG TABS tablet   losartan  (COZAAR ) 50  MG tablet   metFORMIN  (GLUCOPHAGE ) 1000 MG tablet   Other Relevant Orders   Bayer DCA Hb A1c Waived   Basic metabolic panel with GFR   Vitamin B12   Hyperlipidemia associated with type 2 diabetes mellitus (HCC)   Relevant Medications   empagliflozin  (JARDIANCE ) 25 MG TABS tablet   fenofibrate  (TRICOR ) 145 MG tablet   losartan  (COZAAR ) 50 MG tablet   metFORMIN  (GLUCOPHAGE ) 1000 MG tablet   Other Visit Diagnoses       Uncontrolled type 2 diabetes mellitus with hyperglycemia, without long-term current use of insulin  (HCC)       Relevant Medications   empagliflozin  (JARDIANCE ) 25 MG TABS tablet   losartan  (COZAAR ) 50 MG tablet   metFORMIN  (GLUCOPHAGE ) 1000 MG tablet     Hypertriglyceridemia       Relevant Medications   fenofibrate  (TRICOR ) 145 MG tablet   losartan  (COZAAR ) 50 MG tablet          Thyroid  cancer, status post thyroidectomy, undergoing radioactive iodine therapy Post-thyroidectomy, undergoing radioactive iodine therapy. Levothyroxine  increased to suppress hormone levels and reduce recurrence risk. Tumor marker undetectable, no current disease evidence. Aware of recurrence risk and need for monitoring. - Administer radioactive iodine therapy next week. - Continue levothyroxine  200 mcg daily with an additional 200 mcg on Saturdays. - Monitor for side effects of  radioactive iodine therapy, including nausea and stomach upset. - Consider hydroxyzine for anxiety if needed, with awareness of sedative effects.  Type 2 diabetes mellitus, well controlled Well controlled with metformin  and Jardiance . A1c at 6.2%. Appetite stabilized post-surgery. - Continue current diabetes management with metformin  and Jardiance . - Maintain dietary adherence to support glucose control.     A1c today is 7.5 based on his freestyle libre system it looks like it has been running much better over the last 30 days, was up a little bit before that after his surgery so I would continue with current medicine, no change.  Blood pressure and everything else looks good today at 114/79     Follow up plan: Return in about 3 months (around 11/05/2024), or if symptoms worsen or fail to improve, for Diabetes recheck.  Counseling provided for all of the vaccine components Orders Placed This Encounter  Procedures   Bayer DCA Hb A1c Waived   Basic metabolic panel with GFR   Vitamin B12    Thresia Ramanathan, MD [facility] 08/05/2024, 3:56 PM

## 2024-08-06 LAB — BASIC METABOLIC PANEL WITH GFR
BUN/Creatinine Ratio: 16 (ref 9–20)
BUN: 21 mg/dL (ref 6–24)
CO2: 23 mmol/L (ref 20–29)
Calcium: 9.7 mg/dL (ref 8.7–10.2)
Chloride: 100 mmol/L (ref 96–106)
Creatinine, Ser: 1.34 mg/dL — ABNORMAL HIGH (ref 0.76–1.27)
Glucose: 99 mg/dL (ref 70–99)
Potassium: 4.4 mmol/L (ref 3.5–5.2)
Sodium: 139 mmol/L (ref 134–144)
eGFR: 68 mL/min/1.73 (ref >59)

## 2024-08-06 LAB — VITAMIN B12: Vitamin B-12: 761 pg/mL (ref 232–1245)

## 2024-08-10 ENCOUNTER — Ambulatory Visit: Payer: Self-pay | Admitting: Family Medicine

## 2024-08-10 DIAGNOSIS — C73 Malignant neoplasm of thyroid gland: Secondary | ICD-10-CM | POA: Diagnosis not present

## 2024-08-11 DIAGNOSIS — C73 Malignant neoplasm of thyroid gland: Secondary | ICD-10-CM | POA: Diagnosis not present

## 2024-08-12 DIAGNOSIS — C73 Malignant neoplasm of thyroid gland: Secondary | ICD-10-CM | POA: Diagnosis not present

## 2024-08-13 DIAGNOSIS — C73 Malignant neoplasm of thyroid gland: Secondary | ICD-10-CM | POA: Diagnosis not present

## 2024-08-17 ENCOUNTER — Encounter: Payer: Self-pay | Admitting: Family Medicine

## 2024-08-17 ENCOUNTER — Ambulatory Visit (INDEPENDENT_AMBULATORY_CARE_PROVIDER_SITE_OTHER): Admitting: Family Medicine

## 2024-08-17 VITALS — BP 132/85 | HR 79 | Ht 77.0 in | Wt 326.0 lb

## 2024-08-17 DIAGNOSIS — N179 Acute kidney failure, unspecified: Secondary | ICD-10-CM | POA: Diagnosis not present

## 2024-08-17 DIAGNOSIS — J0110 Acute frontal sinusitis, unspecified: Secondary | ICD-10-CM

## 2024-08-17 MED ORDER — CEFDINIR 300 MG PO CAPS
300.0000 mg | ORAL_CAPSULE | Freq: Two times a day (BID) | ORAL | 0 refills | Status: AC
Start: 2024-08-17 — End: ?

## 2024-08-17 NOTE — Progress Notes (Signed)
 BP 132/85   Pulse 79   Ht 6' 5 (1.956 m)   Wt (!) 326 lb (147.9 kg)   SpO2 96%   BMI 38.66 kg/m    Subjective:   Patient ID: Thomas [name], male    DOB: Jul 11, 1981, 43 y.o.   MRN: [id number]  HPI: Thomas [name] is a 43 y.o. male presenting on 08/17/2024 for Cough and chest congestion   Discussed the use of AI scribe software for clinical note transcription with the patient, who gave verbal consent to proceed.  History of Present Illness   Thomas [name] is a 43 year old male who presents with cough and chest congestion.  Cough and chest congestion - Cough and chest congestion present for approximately two weeks, onset after travel to [place]  - Initial symptoms included mild chest cold without head cold symptoms - Congestion worsens at night, causing significant coughing that disrupts sleep - Mucinex  DM used for the past four days, providing partial relief and allowing sleep after 30-60 minutes - No wheezing, shortness of breath, fevers, chills, or body aches - History of albuterol  inhaler use during similar illnesses, but not used during this episode  Thyroid  cancer treatment and associated symptoms - Recent PET scan showed an area of concern at previous thyroid  cancer site - Received high-dose treatment and currently on a special diet until Thursday, when another PET scan is scheduled - Experienced transient flu-like symptoms following a thyroid -related injection two weeks ago, which resolved - Off levothyroxine  for two weeks, with plans to resume on Thursday  Cardiac symptoms and heart rate monitoring - Significant improvement in heart rate since thyroid  treatment, decreased from 100-120 bpm to 70-80 bpm - Monitoring caffeine intake, especially from energy drinks - Regularly checks heart rate while consuming caffeinated beverages  Renal function monitoring - Previous concern regarding kidney function discussed with healthcare team - No changes made to treatment or  fluid intake - Continues regular water consumption          Relevant past medical, surgical, family and social history reviewed and updated as indicated. Interim medical history since our last visit reviewed. Allergies and medications reviewed and updated.  Review of Systems  Constitutional:  Positive for fever. Negative for chills.  HENT:  Positive for congestion, postnasal drip, rhinorrhea, sinus pressure, sneezing and sore throat. Negative for ear discharge, ear pain and voice change.   Eyes:  Negative for pain, discharge, redness and visual disturbance.  Respiratory:  Positive for cough. Negative for shortness of breath and wheezing.   Cardiovascular:  Negative for chest pain and leg swelling.  Musculoskeletal:  Negative for gait problem.  Skin:  Negative for rash.  All other systems reviewed and are negative.   Per HPI unless specifically indicated above   Allergies as of 08/17/2024       Reactions   Penicillins Hives, Rash, Other (See Comments)   As a child   Amoxicillin Hives, Rash        Medication List        Accurate as of August 17, 2024  4:08 PM. If you have any questions, ask your nurse or doctor.          acetaminophen  650 MG suppository Commonly known as: TYLENOL  Place 1 suppository (650 mg total) rectally every 4 (four) hours.   albuterol  108 (90 Base) MCG/ACT inhaler Commonly known as: VENTOLIN  HFA Inhale 2 puffs into the lungs every 6 (six) hours as needed for wheezing or shortness of breath.  cefdinir  300 MG capsule Commonly known as: OMNICEF  Take 1 capsule (300 mg total) by mouth 2 (two) times daily. 1 po BID Started by: Cylee Dattilo A Lorne Winkels   cholecalciferol 25 MCG (1000 UNIT) tablet Commonly known as: VITAMIN D3 Take 4,000 Units by mouth daily.   Clomid 50 MG tablet Generic drug: clomiPHENE Take 25 mg by mouth daily.   empagliflozin  25 MG Tabs tablet Commonly known as: Jardiance  Take 1 tablet (25 mg total) by mouth daily before  breakfast.   fenofibrate  145 MG tablet Commonly known as: TRICOR  Take 1 tablet (145 mg total) by mouth daily.   FreeStyle Libre 3 Sensor Misc PLACE 1 SENSOR ONTO THE SKIN EVERY 14 DAYS TO CHECK GLUCOSE   ibuprofen  400 MG tablet Commonly known as: ADVIL  Take 1 tablet (400 mg total) by mouth every 6 (six) hours.   levothyroxine  200 MCG tablet Commonly known as: SYNTHROID  Take 1 tablet (200 mcg total) by mouth daily before breakfast.   losartan  50 MG tablet Commonly known as: COZAAR  Take 1 tablet (50 mg total) by mouth daily.   metFORMIN  1000 MG tablet Commonly known as: GLUCOPHAGE  Take 1 tablet (1,000 mg total) by mouth 2 (two) times daily with a meal.   multivitamin with minerals Tabs tablet Take 1 tablet by mouth daily.   ondansetron  4 MG disintegrating tablet Commonly known as: ZOFRAN -ODT Take 1 tablet (4 mg total) by mouth every 8 (eight) hours as needed for nausea or vomiting.   rosuvastatin  10 MG tablet Commonly known as: CRESTOR  TAKE 1 TABLET BY MOUTH EVERY DAY         Objective:   BP 132/85   Pulse 79   Ht 6' 5 (1.956 m)   Wt (!) 326 lb (147.9 kg)   SpO2 96%   BMI 38.66 kg/m   Wt Readings from Last 3 Encounters:  08/17/24 (!) 326 lb (147.9 kg)  08/05/24 (!) 320 lb (145.2 kg)  05/13/24 (!) 310 lb (140.6 kg)    Physical Exam Vitals and nursing note reviewed.  Constitutional:      Appearance: Normal appearance.  HENT:     Right Ear: Tympanic membrane and ear canal normal.     Left Ear: Tympanic membrane and ear canal normal.     Mouth/Throat:     Mouth: Mucous membranes are moist.     Pharynx: Oropharynx is clear. No oropharyngeal exudate or posterior oropharyngeal erythema.  Cardiovascular:     Rate and Rhythm: Normal rate and regular rhythm.     Heart sounds: No murmur heard. Pulmonary:     Effort: Pulmonary effort is normal. No respiratory distress.     Breath sounds: No stridor. Rhonchi present. No wheezing or rales.  Neurological:      Mental Status: He is alert.                Assessment & Plan:   Problem List Items Addressed This Visit   None Visit Diagnoses       Acute non-recurrent frontal sinusitis    -  Primary   Relevant Medications   cefdinir  (OMNICEF ) 300 MG capsule     AKI (acute kidney injury)       Relevant Orders   BMP8+EGFR           Acute chest congestion and cough (acute lower respiratory infection) Acute chest congestion and cough for two weeks, worsening at night. Mucinex  DM effective. No fever, wheezing, or significant sputum production. - Continue Mucinex  DM for symptom management.  Thyroid  cancer, status post radioactive iodine therapy Post radioactive iodine therapy with 150 mCi. PET scan showed area of concern, no immediate worry. Awaiting follow-up PET scan. - Await follow-up PET scan to assess treatment efficacy.  Hypothyroidism secondary to thyroid  cancer treatment On thyroid  hormone injection regimen. Off levothyroxine  for two weeks, plans to restart on Thursday. Experiencing caffeine withdrawal symptoms. - Restart levothyroxine  on Thursday. - Continue dietary restrictions until Thursday.      Will treat like sinus infection and give him cefdinir .    Follow up plan: Return if symptoms worsen or fail to improve.  Counseling provided for all of the vaccine components Orders Placed This Encounter  Procedures   BMP8+EGFR    Fonda Levins, MD [facility] 08/17/2024, 4:08 PM

## 2024-08-18 LAB — BMP8+EGFR
BUN/Creatinine Ratio: 10 (ref 9–20)
BUN: 18 mg/dL (ref 6–24)
CO2: 22 mmol/L (ref 20–29)
Calcium: 9 mg/dL (ref 8.7–10.2)
Chloride: 101 mmol/L (ref 96–106)
Creatinine, Ser: 1.81 mg/dL — ABNORMAL HIGH (ref 0.76–1.27)
Glucose: 92 mg/dL (ref 70–99)
Potassium: 4.1 mmol/L (ref 3.5–5.2)
Sodium: 138 mmol/L (ref 134–144)
eGFR: 48 mL/min/1.73 — ABNORMAL LOW (ref >59)

## 2024-08-20 ENCOUNTER — Other Ambulatory Visit: Payer: Self-pay

## 2024-08-20 ENCOUNTER — Ambulatory Visit: Payer: Self-pay | Admitting: Family Medicine

## 2024-08-20 DIAGNOSIS — C73 Malignant neoplasm of thyroid gland: Secondary | ICD-10-CM | POA: Diagnosis not present

## 2024-08-20 DIAGNOSIS — N289 Disorder of kidney and ureter, unspecified: Secondary | ICD-10-CM

## 2024-08-25 ENCOUNTER — Encounter (INDEPENDENT_AMBULATORY_CARE_PROVIDER_SITE_OTHER): Payer: Self-pay

## 2024-08-26 ENCOUNTER — Telehealth (INDEPENDENT_AMBULATORY_CARE_PROVIDER_SITE_OTHER): Payer: Self-pay | Admitting: Otolaryngology

## 2024-08-26 ENCOUNTER — Encounter: Payer: Self-pay | Admitting: Family Medicine

## 2024-08-26 MED ORDER — LEVOTHYROXINE SODIUM 200 MCG PO TABS: 200.0000 ug | ORAL_TABLET | Freq: Every day | ORAL | 1 refills | Status: AC

## 2024-08-26 NOTE — Telephone Encounter (Signed)
 Refilled synthroid

## 2024-09-01 ENCOUNTER — Ambulatory Visit (INDEPENDENT_AMBULATORY_CARE_PROVIDER_SITE_OTHER): Admitting: Otolaryngology

## 2024-09-04 ENCOUNTER — Ambulatory Visit (INDEPENDENT_AMBULATORY_CARE_PROVIDER_SITE_OTHER): Admitting: Physician Assistant

## 2024-09-08 ENCOUNTER — Ambulatory Visit (INDEPENDENT_AMBULATORY_CARE_PROVIDER_SITE_OTHER): Admitting: Otolaryngology

## 2024-09-08 ENCOUNTER — Encounter (INDEPENDENT_AMBULATORY_CARE_PROVIDER_SITE_OTHER): Payer: Self-pay | Admitting: Otolaryngology

## 2024-09-08 VITALS — BP 133/85 | HR 92 | Ht 77.0 in | Wt 326.0 lb

## 2024-09-08 DIAGNOSIS — E890 Postprocedural hypothyroidism: Secondary | ICD-10-CM | POA: Diagnosis not present

## 2024-09-08 DIAGNOSIS — C73 Malignant neoplasm of thyroid gland: Secondary | ICD-10-CM

## 2024-09-08 NOTE — Patient Instructions (Signed)
 Silicone scar cream --- scar away

## 2024-09-08 NOTE — Progress Notes (Signed)
 Dear Dr. Maryanne, Here is my assessment for our mutual patient, Thomas [name]. Thank you for allowing me the opportunity to care for your patient. Please do not hesitate to contact me should you have any other questions. Sincerely, Dr. Eldora [name]  Otolaryngology Clinic Note Referring provider: Dr. 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   Follows with Dr. Dale, received RAI 08/2024.  Patient returns for follow up.  --------------------------------------------------------- 09/08/2024 He reports that he is otherwise doing well. No neck masses or bumps. He is taking his synthroid . No hypocalcemia symptoms.   H&N Surgery: Right Thyroid  lobectomy and completion lobcetomy (05/2024) Personal or FHx of bleeding dz or anesthesia difficulty: no  GLP-1: no AP/AC: no  Tobacco: no  Independent Review of Additional Tests or Records:  Post-Tg Ab and Thyroglobulin (08/11/2024): <1, <0.1 NM Post-Rx Scan (08/20/2024): Successful ablation of the iodine avid tissue in the thyroidectomy bed. No lung or bone metastases.  TSH 06/26/2024: 2.02 PTH 05/27/2024 11 (post); BMP 08/17/2024: Ca 9.0 05/13/2024 and 04/22/2024 Path:  A. THYROID , LEFT, COMPLETION THYROIDECTOMY:  - Thyroid  with surgical site changes.  - Negative for malignancy.  - Incidental benign parathyroid  gland.   A. 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  - See cancer summary below.  - Changes consistent with prior FNA.   PMH/Meds/All/SocHx/FamHx/ROS:   Past Medical History:  Diagnosis Date   Cancer (HCC)    Diabetes mellitus without complication (HCC)    type 2   Dyspnea    albuterol  inhaler prn   GERD  (gastroesophageal reflux disease)    omeprazole    History of kidney stones 03/2015   in [place] - passed stone   HLD (hyperlipidemia)    crestor    Hypertension    losartan    Plantar fasciitis    hx - wears insoles in shoes   Pneumonia    x several   Thyroid  nodule 03/2024   right     Past Surgical History:  Procedure Laterality Date   broken arm Left    as a child   THYROID  LOBECTOMY Right 04/22/2024   Procedure: LOBECTOMY, THYROID ;  Surgeon: [name] Thomas NOVAK, MD;  Location: [facility] OR;  Service: ENT;  Laterality: Right;   THYROIDECTOMY, COMPLETION Left 05/13/2024   Procedure: THYROIDECTOMY, COMPLETION;  Surgeon: [name] Thomas NOVAK, MD;  Location: [facility] OR;  Service: ENT;  Laterality: Left;    Family History  Problem Relation Age of Onset   Diabetes Father    Kidney Stones Father    Hypertension Father    Heart disease Maternal Grandmother    COPD Maternal Grandmother    Heart disease Maternal Grandfather    Hypertension Maternal Grandfather    Diabetes Paternal Grandmother    Diabetes Paternal Grandfather    Heart disease Paternal Grandfather      Social Connections: Unknown (02/09/2022)   Received from [facility]   Social Network    Social Network: Not on file      Current Outpatient Medications:    acetaminophen  (TYLENOL ) 650 MG suppository, Place 1 suppository (650 mg total) rectally every 4 (four) hours., Disp: 12 suppository, Rfl: 0   albuterol  (VENTOLIN  HFA) 108 (90 Base) MCG/ACT inhaler, Inhale 2 puffs into the lungs every 6 (  six) hours as needed for wheezing or shortness of breath., Disp: 8 g, Rfl: 2   cefdinir  (OMNICEF ) 300 MG capsule, Take 1 capsule (300 mg total) by mouth 2 (two) times daily. 1 po BID, Disp: 20 capsule, Rfl: 0   cholecalciferol (VITAMIN D3) 25 MCG (1000 UNIT) tablet, Take 4,000 Units by mouth daily., Disp: , Rfl:    CLOMID 50 MG tablet, Take 25 mg by mouth daily., Disp: , Rfl:    Continuous Glucose Sensor (FREESTYLE LIBRE 3 SENSOR) MISC, PLACE 1 SENSOR  ONTO THE SKIN EVERY 14 DAYS TO CHECK GLUCOSE, Disp: 6 each, Rfl: 3   empagliflozin  (JARDIANCE ) 25 MG TABS tablet, Take 1 tablet (25 mg total) by mouth daily before breakfast., Disp: 90 tablet, Rfl: 3   fenofibrate  (TRICOR ) 145 MG tablet, Take 1 tablet (145 mg total) by mouth daily., Disp: 90 tablet, Rfl: 3   ibuprofen  (ADVIL ) 400 MG tablet, Take 1 tablet (400 mg total) by mouth every 6 (six) hours., Disp: 30 tablet, Rfl: 0   levothyroxine  (SYNTHROID ) 200 MCG tablet, Take 1 tablet (200 mcg total) by mouth daily before breakfast., Disp: 60 tablet, Rfl: 1   losartan  (COZAAR ) 50 MG tablet, Take 1 tablet (50 mg total) by mouth daily., Disp: 90 tablet, Rfl: 3   metFORMIN  (GLUCOPHAGE ) 1000 MG tablet, Take 1 tablet (1,000 mg total) by mouth 2 (two) times daily with a meal., Disp: 180 tablet, Rfl: 3   Multiple Vitamin (MULTIVITAMIN WITH MINERALS) TABS tablet, Take 1 tablet by mouth daily., Disp: , Rfl:    ondansetron  (ZOFRAN -ODT) 4 MG disintegrating tablet, Take 1 tablet (4 mg total) by mouth every 8 (eight) hours as needed for nausea or vomiting., Disp: 20 tablet, Rfl: 0   rosuvastatin  (CRESTOR ) 10 MG tablet, TAKE 1 TABLET BY MOUTH EVERY DAY, Disp: 90 tablet, Rfl: 3   Physical Exam:   BP 133/85 (BP Location: Left Arm, Patient Position: Sitting, Cuff Size: Large)   Pulse 92   Ht 6' 5 (1.956 m)   Wt (!) 326 lb (147.9 kg)   SpO2 96%   BMI 38.66 kg/m   Salient findings:  CN II-XII intact No acute distress Voice strong, no breathiness Incision c/d/I, no neck masses  Seprately Identifiable Procedures:  Prior to initiating any procedures, risks/benefits/alternatives were explained to the patient and verbal consent obtained. None  Impression & Plans:  Thomas [name] is a 43 y.o. male with:  1. Thyroid  cancer (HCC)   2. Post-operative hypothyroidism    Follicular Variant Papillary Thyroid  Carcinoma, oncocytic subtype, widely invasive; as above; s/p RAI, now on TSH suppression TSH goal <0.1 F/u in  June 2025    See below regarding exact medications prescribed this encounter including dosages and route: No orders of the defined types were placed in this encounter.     Thank you for allowing me the opportunity to care for your patient. Please do not hesitate to contact me should you have any other questions.  Sincerely, Thomas Blanch, MD Otolaryngologist (ENT), [facility] Phone: [phone] Fax: [phone]  09/08/2024, 3:52 PM   MDM:  I have personally spent 30 minutes involved in face-to-face and non-face-to-face activities for this patient on the day of the visit.  Professional time spent excludes any procedures performed but includes the following activities, in addition to those noted in the documentation: preparing to see the patient (review of outside documentation and results), performing a medically appropriate examination, counseling, documenting in the electronic health record, independently interpreting results (RAI  results)

## 2024-09-16 ENCOUNTER — Other Ambulatory Visit

## 2024-09-16 DIAGNOSIS — N289 Disorder of kidney and ureter, unspecified: Secondary | ICD-10-CM

## 2024-09-17 ENCOUNTER — Ambulatory Visit: Payer: Self-pay | Admitting: Family Medicine

## 2024-09-17 LAB — CMP14+EGFR
ALT: 17 IU/L (ref 0–44)
AST: 15 IU/L (ref 0–40)
Albumin: 4.2 g/dL (ref 4.1–5.1)
Alkaline Phosphatase: 63 IU/L (ref 47–123)
BUN/Creatinine Ratio: 17 (ref 9–20)
BUN: 21 mg/dL (ref 6–24)
Bilirubin Total: 0.3 mg/dL (ref 0.0–1.2)
CO2: 23 mmol/L (ref 20–29)
Calcium: 9.2 mg/dL (ref 8.7–10.2)
Chloride: 102 mmol/L (ref 96–106)
Creatinine, Ser: 1.25 mg/dL (ref 0.76–1.27)
Globulin, Total: 2.3 g/dL (ref 1.5–4.5)
Glucose: 123 mg/dL — ABNORMAL HIGH (ref 70–99)
Potassium: 3.6 mmol/L (ref 3.5–5.2)
Sodium: 141 mmol/L (ref 134–144)
Total Protein: 6.5 g/dL (ref 6.0–8.5)
eGFR: 74 mL/min/1.73 (ref >59)

## 2024-10-04 ENCOUNTER — Encounter: Payer: Self-pay | Admitting: Family Medicine

## 2024-10-05 ENCOUNTER — Other Ambulatory Visit: Payer: Self-pay

## 2024-10-05 DIAGNOSIS — R400 Somnolence: Secondary | ICD-10-CM

## 2024-10-29 ENCOUNTER — Ambulatory Visit: Admitting: Family Medicine

## 2024-11-05 ENCOUNTER — Other Ambulatory Visit: Payer: Self-pay | Admitting: Family Medicine

## 2024-11-05 ENCOUNTER — Ambulatory Visit: Admitting: Family Medicine

## 2024-11-05 DIAGNOSIS — K219 Gastro-esophageal reflux disease without esophagitis: Secondary | ICD-10-CM

## 2024-11-16 ENCOUNTER — Ambulatory Visit: Admitting: Family Medicine

## 2024-12-14 ENCOUNTER — Ambulatory Visit: Admitting: Family Medicine

## 2025-03-09 ENCOUNTER — Ambulatory Visit (INDEPENDENT_AMBULATORY_CARE_PROVIDER_SITE_OTHER): Admitting: Otolaryngology
